# Patient Record
Sex: Female | Born: 1935 | Race: White | Hispanic: No | Marital: Married | Smoking: Never smoker
Health system: Southern US, Community
[De-identification: ages and names within clinical notes are randomized; demographics above are authoritative.]

## PROBLEM LIST (undated history)

## (undated) DIAGNOSIS — S93402A Sprain of unspecified ligament of left ankle, initial encounter: Principal | ICD-10-CM

## (undated) DIAGNOSIS — E079 Disorder of thyroid, unspecified: Secondary | ICD-10-CM

## (undated) DIAGNOSIS — T7840XA Allergy, unspecified, initial encounter: Secondary | ICD-10-CM

## (undated) DIAGNOSIS — L989 Disorder of the skin and subcutaneous tissue, unspecified: Secondary | ICD-10-CM

## (undated) DIAGNOSIS — F329 Major depressive disorder, single episode, unspecified: Secondary | ICD-10-CM

## (undated) DIAGNOSIS — N390 Urinary tract infection, site not specified: Secondary | ICD-10-CM

## (undated) DIAGNOSIS — L570 Actinic keratosis: Secondary | ICD-10-CM

## (undated) DIAGNOSIS — R51 Headache: Secondary | ICD-10-CM

## (undated) DIAGNOSIS — N6019 Diffuse cystic mastopathy of unspecified breast: Secondary | ICD-10-CM

## (undated) DIAGNOSIS — Z0000 Encounter for general adult medical examination without abnormal findings: Secondary | ICD-10-CM

## (undated) DIAGNOSIS — E785 Hyperlipidemia, unspecified: Secondary | ICD-10-CM

## (undated) DIAGNOSIS — M549 Dorsalgia, unspecified: Secondary | ICD-10-CM

## (undated) DIAGNOSIS — M1990 Unspecified osteoarthritis, unspecified site: Secondary | ICD-10-CM

## (undated) DIAGNOSIS — Z124 Encounter for screening for malignant neoplasm of cervix: Secondary | ICD-10-CM

## (undated) DIAGNOSIS — F32A Depression, unspecified: Secondary | ICD-10-CM

## (undated) HISTORY — DX: Urinary tract infection, site not specified: N39.0

## (undated) HISTORY — DX: Hyperlipidemia, unspecified: E78.5

## (undated) HISTORY — DX: Depression, unspecified: F32.A

## (undated) HISTORY — DX: Unspecified osteoarthritis, unspecified site: M19.90

## (undated) HISTORY — DX: Sprain of unspecified ligament of left ankle, initial encounter: S93.402A

## (undated) HISTORY — DX: Disorder of the skin and subcutaneous tissue, unspecified: L98.9

## (undated) HISTORY — DX: Allergy, unspecified, initial encounter: T78.40XA

## (undated) HISTORY — PX: BREAST SURGERY: SHX581

## (undated) HISTORY — DX: Disorder of thyroid, unspecified: E07.9

## (undated) HISTORY — DX: Encounter for general adult medical examination without abnormal findings: Z00.00

## (undated) HISTORY — DX: Actinic keratosis: L57.0

## (undated) HISTORY — DX: Encounter for screening for malignant neoplasm of cervix: Z12.4

## (undated) HISTORY — DX: Diffuse cystic mastopathy of unspecified breast: N60.19

## (undated) HISTORY — PX: APPENDECTOMY: SHX54

## (undated) HISTORY — DX: Major depressive disorder, single episode, unspecified: F32.9

## (undated) HISTORY — PX: TOTAL HIP ARTHROPLASTY: SHX124

## (undated) HISTORY — DX: Dorsalgia, unspecified: M54.9

---

## 1980-09-27 HISTORY — PX: ABDOMINAL HYSTERECTOMY: SHX81

## 2000-09-27 HISTORY — PX: OTHER SURGICAL HISTORY: SHX169

## 2006-09-27 LAB — HM PAP SMEAR

## 2011-08-04 ENCOUNTER — Ambulatory Visit (INDEPENDENT_AMBULATORY_CARE_PROVIDER_SITE_OTHER): Payer: Medicare Other | Admitting: Family Medicine

## 2011-08-04 ENCOUNTER — Encounter: Payer: Self-pay | Admitting: Family Medicine

## 2011-08-04 DIAGNOSIS — M8580 Other specified disorders of bone density and structure, unspecified site: Secondary | ICD-10-CM | POA: Insufficient documentation

## 2011-08-04 DIAGNOSIS — M129 Arthropathy, unspecified: Secondary | ICD-10-CM

## 2011-08-04 DIAGNOSIS — L570 Actinic keratosis: Secondary | ICD-10-CM

## 2011-08-04 DIAGNOSIS — M1990 Unspecified osteoarthritis, unspecified site: Secondary | ICD-10-CM | POA: Insufficient documentation

## 2011-08-04 DIAGNOSIS — E079 Disorder of thyroid, unspecified: Secondary | ICD-10-CM

## 2011-08-04 DIAGNOSIS — N6019 Diffuse cystic mastopathy of unspecified breast: Secondary | ICD-10-CM

## 2011-08-04 DIAGNOSIS — Z0000 Encounter for general adult medical examination without abnormal findings: Secondary | ICD-10-CM | POA: Insufficient documentation

## 2011-08-04 DIAGNOSIS — M810 Age-related osteoporosis without current pathological fracture: Secondary | ICD-10-CM

## 2011-08-04 DIAGNOSIS — F32A Depression, unspecified: Secondary | ICD-10-CM | POA: Insufficient documentation

## 2011-08-04 DIAGNOSIS — J302 Other seasonal allergic rhinitis: Secondary | ICD-10-CM | POA: Insufficient documentation

## 2011-08-04 DIAGNOSIS — E785 Hyperlipidemia, unspecified: Secondary | ICD-10-CM

## 2011-08-04 DIAGNOSIS — N390 Urinary tract infection, site not specified: Secondary | ICD-10-CM

## 2011-08-04 DIAGNOSIS — E039 Hypothyroidism, unspecified: Secondary | ICD-10-CM | POA: Insufficient documentation

## 2011-08-04 DIAGNOSIS — T7840XA Allergy, unspecified, initial encounter: Secondary | ICD-10-CM

## 2011-08-04 DIAGNOSIS — F329 Major depressive disorder, single episode, unspecified: Secondary | ICD-10-CM

## 2011-08-04 HISTORY — DX: Actinic keratosis: L57.0

## 2011-08-04 HISTORY — DX: Urinary tract infection, site not specified: N39.0

## 2011-08-04 HISTORY — DX: Encounter for general adult medical examination without abnormal findings: Z00.00

## 2011-08-04 MED ORDER — FLUTICASONE PROPIONATE 50 MCG/ACT NA SUSP: NASAL | Status: DC

## 2011-08-04 NOTE — Assessment & Plan Note (Signed)
Patient up to date on flu shot, Td, colonoscopy (06/28/11), MGM and pap date back to 2008 and she agrees to return soon for annual GYN visit.

## 2011-08-04 NOTE — Progress Notes (Signed)
Molly Fleming [id number] [birth year] 08/04/2011      Progress Note New Patient  Subjective  Chief Complaint  Chief Complaint  Patient presents with  . Establish Care    new patient    HPI  Patient is a 75 yo Caucasian female in today for new patient appt. she is generally in good health but does have multiple things she manages. Presently her allergies have flared. His nasal congestion itching is taking her Zyrtec daily but is actually most annoyed by her visual symptoms at this time. She describes blurring and itching and discharge in her eyes. Has tried eyedrops and asked with no good result. She has a diagnosis of osteoporosis dating back roughly 13 years. Was maintained on Fosamax for roughly 10 years but chose to stop about 2 years ago due to pain she had red. He did not have a bone densitometry in greater than 10 years. No recent falls or fractures. She does follow with dermatology practice secondary to actinic keratosis. She overall feels he is doing well but stays on a low dose of Prozac to treat history depression anxiety secondary to some anxiety disorder and a 26 year old mother with dementia lives alone in [place] he still. She helps to manage her mother does have home health care but finds this stressful period she does struggle with a tractogram. Past without good results. She also complains of chronic IBS symptoms. She reports frequent and loose stool especially when she overeats or eats fatty or rich foods. She can have anywhere from 2 visits from stool daily. She takes a half a milligram twice a day she has less stooling. Denies bloody or tarry stool. A recent abdominal pain or urinary symptoms. No chest pain, palpitations, shortness of breath, GI or GU complaints noted at the present time.Has a h/o recurrent UTIs reporting an infection roughly every 3 months. Uses ciprofloxacin intermittently to treat these.  Past Medical History  Diagnosis Date  . Osteoporosis     started  taking meds at age 73 then just went off recently- due to conflict she has heard  . Hyperlipidemia   . Allergy     seasonal  . Mumps as a child  . Measles   . Whooping cough as a child  . Cystic disease of breast   . Arthritis     right hand supplement  . Depression   . Thyroid disease     s/p thyroidectomy, goiter  . Actinic keratosis 08/04/2011  . Preventative health care 08/04/2011    Past Surgical History  Procedure Date  . Removed part of thyroid 2002  . Breast surgery     breast biopsies, b/l benign  . Abdominal hysterectomy 1982    partial still has ovaries, uterine prolapse and menorraghia    Family History  Problem Relation Age of Onset  . Adopted: Yes  . Cancer Mother   . Heart attack Father   . Heart disease Father   . Cancer Brother     colon  . Anxiety disorder Son     History   Social History  . Marital Status: Married    Spouse Name: N/A    Number of Children: N/A  . Years of Education: N/A   Occupational History  . Not on file.   Social History Main Topics  . Smoking status: Never Smoker   . Smokeless tobacco: Never Used  . Alcohol Use: No  . Drug Use: No  . Sexually Active: Yes -- Female partner(s)  Other Topics Concern  . Not on file   Social History Narrative  . No narrative on file    No current outpatient prescriptions on file prior to visit.    No Known Allergies  Review of Systems  Review of Systems  Constitutional: Negative for fever, chills and malaise/fatigue.  HENT: Positive for congestion. Negative for hearing loss, nosebleeds and sore throat.   Eyes: Positive for blurred vision and discharge. Negative for double vision, photophobia, pain and redness.  Respiratory: Negative for cough, sputum production, shortness of breath and wheezing.   Cardiovascular: Negative for chest pain, palpitations and leg swelling.  Gastrointestinal: Negative for heartburn, nausea, vomiting, abdominal pain, diarrhea, constipation and blood  in stool.  Genitourinary: Negative for dysuria, urgency, frequency and hematuria.  Musculoskeletal: Negative for myalgias, back pain and falls.  Skin: Negative for itching and rash.  Neurological: Negative for dizziness, tremors, sensory change, focal weakness, loss of consciousness, weakness and headaches.  Endo/Heme/Allergies: Negative for polydipsia. Does not bruise/bleed easily.  Psychiatric/Behavioral: Negative for depression and suicidal ideas. The patient is not nervous/anxious and does not have insomnia.     Objective  BP 124/82  Pulse 69  Temp(Src) 97.7 F (36.5 C) (Oral)  Ht 5\' 2"  (1.575 m)  Wt 157 lb 12.8 oz (71.578 kg)  BMI 28.86 kg/m2  SpO2 98%  Physical Exam  Physical Exam  Constitutional: She is oriented to person, place, and time and well-developed, well-nourished, and in no distress. No distress.  HENT:  Head: Normocephalic and atraumatic.  Right Ear: External ear normal.  Left Ear: External ear normal.  Nose: Nose normal.  Mouth/Throat: Oropharynx is clear and moist. No oropharyngeal exudate.  Eyes: Conjunctivae are normal. Pupils are equal, round, and reactive to light. Right eye exhibits no discharge. Left eye exhibits no discharge. No scleral icterus.  Neck: Normal range of motion. Neck supple. No thyromegaly present.  Cardiovascular: Normal rate, regular rhythm, normal heart sounds and intact distal pulses.   No murmur heard. Pulmonary/Chest: Effort normal and breath sounds normal. No respiratory distress. She has no wheezes. She has no rales.  Abdominal: Soft. Bowel sounds are normal. She exhibits no distension and no mass. There is no tenderness.  Musculoskeletal: Normal range of motion. She exhibits no edema and no tenderness.  Lymphadenopathy:    She has no cervical adenopathy.  Neurological: She is alert and oriented to person, place, and time. She has normal reflexes. No cranial nerve deficit. Coordination normal.  Skin: Skin is warm and dry. No  rash noted. She is not diaphoretic.  Psychiatric: Mood, memory and affect normal.       Assessment & Plan  Cystic disease of breast Fibrocystic breast, last MGM in 2010. Will order with breast exam at next visit no concerns per patient today  Arthritis Worst in right hand and knees. Encouraged her to remain active and start fatty acid supplement  Allergic state Symptoms are worst in eyes at present will add a nasal steroid, send in an rx for Fluticasone or can try a sample of Nasonex, 1 spray each nostril daily and continue her Cetirizine  Osteoporosis Patient stopped her Fosamax roughly 2 1/2 years ago after being on it for roughly 10 years. We will proceed with repeat bone densitometry which she has not had in roughly 10 years. Encouraged her to stay active and get good calcium and vitamin d intake  Thyroid disease Will check TSH level prior to next visit.  Hyperlipidemia Avoid trans fats, increase exercise and  add a Megared cap daily  Depression She has been stable on a low dose of Prozac and she would like to stay on it for now  Actinic keratosis No recent concerning lesions  Preventative health care Patient up to date on flu shot, Td, colonoscopy (06/28/11), MGM and pap date back to 2008 and she agrees to return soon for annual GYN visit.  Frequent UTI Patient may use cipro prophylactically s/p coitus or as needed for symptoms.

## 2011-08-04 NOTE — Assessment & Plan Note (Signed)
Worst in right hand and knees. Encouraged her to remain active and start fatty acid supplement

## 2011-08-04 NOTE — Patient Instructions (Signed)

## 2011-08-04 NOTE — Assessment & Plan Note (Signed)
Patient may use cipro prophylactically s/p coitus or as needed for symptoms.

## 2011-08-04 NOTE — Assessment & Plan Note (Signed)
No recent concerning lesions

## 2011-08-04 NOTE — Assessment & Plan Note (Signed)
Fibrocystic breast, last MGM in 2010. Will order with breast exam at next visit no concerns per patient today

## 2011-08-04 NOTE — Assessment & Plan Note (Addendum)
Symptoms are worst in eyes at present will add a nasal steroid, send in an rx for Fluticasone or can try a sample of Nasonex, 1 spray each nostril daily and continue her Cetirizine

## 2011-08-04 NOTE — Assessment & Plan Note (Signed)
Will check TSH level prior to next visit.

## 2011-08-04 NOTE — Assessment & Plan Note (Signed)
She has been stable on a low dose of Prozac and she would like to stay on it for now

## 2011-08-04 NOTE — Assessment & Plan Note (Signed)
Patient stopped her Fosamax roughly 2 1/2 years ago after being on it for roughly 10 years. We will proceed with repeat bone densitometry which she has not had in roughly 10 years. Encouraged her to stay active and get good calcium and vitamin d intake

## 2011-08-04 NOTE — Assessment & Plan Note (Signed)
Avoid trans fats, increase exercise and add a Megared cap daily

## 2011-08-12 ENCOUNTER — Ambulatory Visit
Admission: RE | Admit: 2011-08-12 | Discharge: 2011-08-12 | Disposition: A | Discharge status: Home / Self Care | Payer: Medicare Other | Source: Ambulatory Visit | Attending: Family Medicine | Admitting: Family Medicine

## 2011-08-12 DIAGNOSIS — Z780 Asymptomatic menopausal state: Secondary | ICD-10-CM

## 2011-08-30 ENCOUNTER — Telehealth: Payer: Self-pay

## 2011-08-30 NOTE — Telephone Encounter (Signed)
Per MD to inform pt that dexa scan improved to Osteopenia. MD recommends patient to continue calcium, vitamin d bid and exercise.  Left a detailed message on patients voicemail

## 2011-09-01 ENCOUNTER — Encounter: Payer: Self-pay | Admitting: Family Medicine

## 2011-09-14 ENCOUNTER — Inpatient Hospital Stay (HOSPITAL_COMMUNITY): Payer: Medicare Other

## 2011-09-14 ENCOUNTER — Emergency Department (HOSPITAL_COMMUNITY): Payer: Medicare Other

## 2011-09-14 ENCOUNTER — Encounter (HOSPITAL_COMMUNITY): Payer: Self-pay | Admitting: Internal Medicine

## 2011-09-14 ENCOUNTER — Inpatient Hospital Stay (HOSPITAL_COMMUNITY)
Admission: EM | Admit: 2011-09-14 | Discharge: 2011-09-20 | DRG: 065 | Disposition: A | Discharge status: Home Health | Payer: Medicare Other | Attending: Internal Medicine | Admitting: Internal Medicine

## 2011-09-14 ENCOUNTER — Other Ambulatory Visit: Payer: Self-pay

## 2011-09-14 DIAGNOSIS — F3289 Other specified depressive episodes: Secondary | ICD-10-CM | POA: Diagnosis present

## 2011-09-14 DIAGNOSIS — T7840XA Allergy, unspecified, initial encounter: Secondary | ICD-10-CM

## 2011-09-14 DIAGNOSIS — F329 Major depressive disorder, single episode, unspecified: Secondary | ICD-10-CM | POA: Diagnosis present

## 2011-09-14 DIAGNOSIS — Z79899 Other long term (current) drug therapy: Secondary | ICD-10-CM

## 2011-09-14 DIAGNOSIS — I639 Cerebral infarction, unspecified: Secondary | ICD-10-CM | POA: Diagnosis present

## 2011-09-14 DIAGNOSIS — I6340 Cerebral infarction due to embolism of unspecified cerebral artery: Principal | ICD-10-CM | POA: Diagnosis present

## 2011-09-14 DIAGNOSIS — E876 Hypokalemia: Secondary | ICD-10-CM | POA: Diagnosis present

## 2011-09-14 DIAGNOSIS — M810 Age-related osteoporosis without current pathological fracture: Secondary | ICD-10-CM | POA: Diagnosis present

## 2011-09-14 DIAGNOSIS — R197 Diarrhea, unspecified: Secondary | ICD-10-CM | POA: Diagnosis present

## 2011-09-14 DIAGNOSIS — E785 Hyperlipidemia, unspecified: Secondary | ICD-10-CM | POA: Diagnosis present

## 2011-09-14 DIAGNOSIS — E039 Hypothyroidism, unspecified: Secondary | ICD-10-CM | POA: Diagnosis present

## 2011-09-14 DIAGNOSIS — E872 Acidosis, unspecified: Secondary | ICD-10-CM | POA: Diagnosis present

## 2011-09-14 DIAGNOSIS — G43909 Migraine, unspecified, not intractable, without status migrainosus: Secondary | ICD-10-CM | POA: Diagnosis present

## 2011-09-14 DIAGNOSIS — Z87440 Personal history of urinary (tract) infections: Secondary | ICD-10-CM

## 2011-09-14 DIAGNOSIS — R4182 Altered mental status, unspecified: Secondary | ICD-10-CM

## 2011-09-14 DIAGNOSIS — R42 Dizziness and giddiness: Secondary | ICD-10-CM | POA: Diagnosis present

## 2011-09-14 HISTORY — DX: Headache: R51

## 2011-09-14 LAB — CBC
HCT: 37.6 % (ref 36.0–46.0)
Hemoglobin: 13.1 g/dL (ref 12.0–15.0)
Hemoglobin: 12.7 g/dL (ref 12.0–15.0)
MCH: 30.3 pg (ref 26.0–34.0)
MCH: 29.5 pg (ref 26.0–34.0)
MCHC: 34.8 g/dL (ref 30.0–36.0)
MCV: 87 fL (ref 78.0–100.0)
MCV: 88.4 fL (ref 78.0–100.0)
Platelets: 199 10*3/uL (ref 150–400)
Platelets: 217 10*3/uL (ref 150–400)
RBC: 4.32 MIL/uL (ref 3.87–5.11)
RBC: 4.31 MIL/uL (ref 3.87–5.11)
RDW: 12.4 % (ref 11.5–15.5)
WBC: 10 10*3/uL (ref 4.0–10.5)
WBC: 11.1 10*3/uL — ABNORMAL HIGH (ref 4.0–10.5)

## 2011-09-14 LAB — BASIC METABOLIC PANEL
BUN: 22 mg/dL (ref 6–23)
CO2: 21 mEq/L (ref 19–32)
Calcium: 9.3 mg/dL (ref 8.4–10.5)
Chloride: 103 mEq/L (ref 96–112)
Creatinine, Ser: 0.81 mg/dL (ref 0.50–1.10)
GFR calc Af Amer: 82 mL/min — ABNORMAL LOW (ref >90)
GFR calc non Af Amer: 71 mL/min — ABNORMAL LOW (ref >90)
Glucose, Bld: 152 mg/dL — ABNORMAL HIGH (ref 70–99)
Potassium: 3.1 mEq/L — ABNORMAL LOW (ref 3.5–5.1)
Sodium: 139 mEq/L (ref 135–145)

## 2011-09-14 LAB — COMPREHENSIVE METABOLIC PANEL
AST: 25 U/L (ref 0–37)
Albumin: 3.7 g/dL (ref 3.5–5.2)
BUN: 15 mg/dL (ref 6–23)
Calcium: 8.1 mg/dL — ABNORMAL LOW (ref 8.4–10.5)
Chloride: 106 mEq/L (ref 96–112)
Creatinine, Ser: 0.73 mg/dL (ref 0.50–1.10)
Total Bilirubin: 0.3 mg/dL (ref 0.3–1.2)
Total Protein: 7.2 g/dL (ref 6.0–8.3)

## 2011-09-14 LAB — POCT I-STAT, CHEM 8
BUN: 22 mg/dL (ref 6–23)
Calcium, Ion: 1.07 mmol/L — ABNORMAL LOW (ref 1.12–1.32)
Chloride: 108 mEq/L (ref 96–112)
Creatinine, Ser: 0.9 mg/dL (ref 0.50–1.10)
Glucose, Bld: 157 mg/dL — ABNORMAL HIGH (ref 70–99)
HCT: 40 % (ref 36.0–46.0)
Hemoglobin: 13.6 g/dL (ref 12.0–15.0)
Potassium: 3.1 mEq/L — ABNORMAL LOW (ref 3.5–5.1)
Sodium: 142 mEq/L (ref 135–145)
TCO2: 22 mmol/L (ref 0–100)

## 2011-09-14 LAB — URINALYSIS, ROUTINE W REFLEX MICROSCOPIC
Bilirubin Urine: NEGATIVE
Glucose, UA: NEGATIVE mg/dL
Hgb urine dipstick: NEGATIVE
Ketones, ur: 40 mg/dL — AB
Leukocytes, UA: NEGATIVE
Nitrite: NEGATIVE
Protein, ur: NEGATIVE mg/dL
Specific Gravity, Urine: 1.02 (ref 1.005–1.030)
Urobilinogen, UA: 0.2 mg/dL (ref 0.0–1.0)
pH: 7.5 (ref 5.0–8.0)

## 2011-09-14 LAB — CARDIAC PANEL(CRET KIN+CKTOT+MB+TROPI)
CK, MB: 2.8 ng/mL (ref 0.3–4.0)
CK, MB: 2.3 ng/mL (ref 0.3–4.0)
Relative Index: 2.5 (ref 0.0–2.5)
Troponin I: <0.3 ng/mL (ref <0.30)
Troponin I: <0.3 ng/mL (ref <0.30)

## 2011-09-14 LAB — TROPONIN I: Troponin I: <0.3 ng/mL (ref <0.30)

## 2011-09-14 MED ORDER — DIAZEPAM 5 MG/ML IJ SOLN
5.0000 mg | Freq: Once | INTRAMUSCULAR | Status: AC
  Administered 2011-09-14: 5 mg via INTRAVENOUS

## 2011-09-14 MED ORDER — ONDANSETRON HCL 4 MG/2ML IJ SOLN
4.0000 mg | Freq: Three times a day (TID) | INTRAMUSCULAR | Status: AC | PRN
  Filled 2011-09-14: qty 2

## 2011-09-14 MED ORDER — SIMVASTATIN 20 MG PO TABS
20.0000 mg | ORAL_TABLET | Freq: Every day | ORAL | Status: DC
  Administered 2011-09-14 – 2011-09-19 (×6): 20 mg via ORAL
  Filled 2011-09-14 (×7): qty 1

## 2011-09-14 MED ORDER — DIAZEPAM 5 MG/ML IJ SOLN
INTRAMUSCULAR | Status: AC
  Administered 2011-09-14: 5 mg via INTRAVENOUS
  Filled 2011-09-14: qty 2

## 2011-09-14 MED ORDER — POTASSIUM CHLORIDE 10 MEQ/100ML IV SOLN
10.0000 meq | INTRAVENOUS | Status: AC
  Filled 2011-09-14 (×3): qty 100

## 2011-09-14 MED ORDER — ONDANSETRON HCL 4 MG/2ML IJ SOLN
4.0000 mg | Freq: Once | INTRAMUSCULAR | Status: AC
  Administered 2011-09-14: 4 mg via INTRAVENOUS
  Filled 2011-09-14: qty 2

## 2011-09-14 MED ORDER — SENNOSIDES-DOCUSATE SODIUM 8.6-50 MG PO TABS
1.0000 | ORAL_TABLET | Freq: Every evening | ORAL | Status: DC | PRN
  Filled 2011-09-14: qty 1

## 2011-09-14 MED ORDER — POTASSIUM CHLORIDE 10 MEQ/100ML IV SOLN
10.0000 meq | Freq: Once | INTRAVENOUS | Status: AC
  Administered 2011-09-14: 10 meq via INTRAVENOUS
  Filled 2011-09-14: qty 100

## 2011-09-14 MED ORDER — LORATADINE 10 MG PO TABS
10.0000 mg | ORAL_TABLET | Freq: Every day | ORAL | Status: DC
  Administered 2011-09-15 – 2011-09-20 (×6): 10 mg via ORAL
  Filled 2011-09-14 (×7): qty 1

## 2011-09-14 MED ORDER — SODIUM CHLORIDE 0.9 % IV SOLN
INTRAVENOUS | Status: DC
  Administered 2011-09-14 – 2011-09-15 (×2): via INTRAVENOUS

## 2011-09-14 MED ORDER — SODIUM CHLORIDE 0.9 % IV SOLN
INTRAVENOUS | Status: AC
  Administered 2011-09-14: 09:00:00 via INTRAVENOUS

## 2011-09-14 MED ORDER — SODIUM CHLORIDE 0.9 % IV BOLUS (SEPSIS)
1000.0000 mL | Freq: Once | INTRAVENOUS | Status: AC
  Administered 2011-09-14: 1000 mL via INTRAVENOUS

## 2011-09-14 MED ORDER — ONDANSETRON HCL 4 MG/2ML IJ SOLN
INTRAMUSCULAR | Status: AC
  Administered 2011-09-14: 4 mg via INTRAVENOUS
  Filled 2011-09-14: qty 2

## 2011-09-14 MED ORDER — ONDANSETRON HCL 4 MG/2ML IJ SOLN
4.0000 mg | Freq: Once | INTRAMUSCULAR | Status: AC
  Administered 2011-09-14: 4 mg via INTRAVENOUS

## 2011-09-14 MED ORDER — LEVOTHYROXINE SODIUM 75 MCG PO TABS
75.0000 ug | ORAL_TABLET | Freq: Every day | ORAL | Status: DC
  Administered 2011-09-15 – 2011-09-20 (×6): 75 ug via ORAL
  Filled 2011-09-14 (×8): qty 1

## 2011-09-14 MED ORDER — PROMETHAZINE HCL 25 MG/ML IJ SOLN
12.5000 mg | INTRAMUSCULAR | Status: AC
Start: 2011-09-14 — End: 2011-09-14
  Administered 2011-09-14: 12.5 mg via INTRAVENOUS
  Filled 2011-09-14: qty 1

## 2011-09-14 MED ORDER — VITAMIN B-12 1000 MCG PO TABS
1000.0000 ug | ORAL_TABLET | Freq: Every day | ORAL | Status: DC
  Administered 2011-09-15 – 2011-09-20 (×6): 1000 ug via ORAL
  Filled 2011-09-14 (×7): qty 1

## 2011-09-14 MED ORDER — PAROXETINE HCL 10 MG PO TABS
10.0000 mg | ORAL_TABLET | ORAL | Status: DC
  Administered 2011-09-15 – 2011-09-20 (×6): 10 mg via ORAL
  Filled 2011-09-14 (×8): qty 1

## 2011-09-14 MED ORDER — FLUTICASONE PROPIONATE 50 MCG/ACT NA SUSP
1.0000 | Freq: Every day | NASAL | Status: DC
  Administered 2011-09-14 – 2011-09-20 (×7): 1 via NASAL
  Filled 2011-09-14: qty 16

## 2011-09-14 NOTE — ED Notes (Signed)
Back from CT.  Pt states she is nauseated and "feels sick".  Pt does not recognize her son who is at the bedside, but she does recognize her husband.

## 2011-09-14 NOTE — ED Notes (Signed)
No relief after zofran.  Continues to moan and say, "I'm so sick".

## 2011-09-14 NOTE — H&P (Addendum)
TARAH BUBOLTZ is an 75 y.o. female.   PCP- Corinda Gubler at [place]. Chief Complaint: Dizziness. HPI: 75 year-old female history of hyperlipidemia, hypothyroidism, chronic recurrent diarrhea on Imodium, chronic recurrent UTI was watching television with her husband at around 10 PM she complained of fullness in the right ear and started complaining dizziness. Her husband has to help her to the bedroom and she was feeling intensely dizzy. Then she started throwing up multiple times. She became very confused. At around 12 AM she was brought ER. CT of the head without contrast nausea and the acute. The ER physician Dr. Juleen China tried some diazepam for possible vertigo. This did not help. Patient in addition also was no abdominal discomfort. In and out catheter had 300 cc urine drained. She still complaining of abdominal discomfort around the epigastric area and also the dizziness. She has become more oriented and is able to recognize her husband but still not able to say the date and time and place. Patient will be admitted for further workup. Patient did not complain of any chest pain, cough, did not function of upper or lower extremities, denied any headache. Did not lose consciousness.  Past Medical History  Diagnosis Date  . Osteoporosis     started taking meds at age 17 then just went off recently- due to conflict she has heard  . Hyperlipidemia   . Allergy     seasonal  . Mumps as a child  . Measles   . Whooping cough as a child  . Cystic disease of breast   . Arthritis     right hand supplement  . Depression   . Thyroid disease     s/p thyroidectomy, goiter  . Actinic keratosis 08/04/2011  . Preventative health care 08/04/2011  . Frequent UTI 08/04/2011    Past Surgical History  Procedure Date  . Removed part of thyroid 2002  . Breast surgery     breast biopsies, b/l benign  . Abdominal hysterectomy 1982    partial still has ovaries, uterine prolapse and menorraghia    Family History    Problem Relation Age of Onset  . Adopted: Yes  . Cancer Mother   . Heart attack Father   . Heart disease Father   . Cancer Brother     colon  . Anxiety disorder Son    Social History:  reports that she has never smoked. She has never used smokeless tobacco. She reports that she does not drink alcohol or use illicit drugs.  Allergies:  Allergies  Allergen Reactions  . Sulfa Antibiotics Other (See Comments)    unknown    Medications Prior to Admission  Medication Dose Route Frequency Provider Last Rate Last Dose  . diazepam (VALIUM) injection 5 mg  5 mg Intravenous Once Raeford Razor, MD   5 mg at 09/14/11 0144  . ondansetron (ZOFRAN) injection 4 mg  4 mg Intravenous Once Raeford Razor, MD   4 mg at 09/14/11 0141  . ondansetron (ZOFRAN) injection 4 mg  4 mg Intravenous Once Raeford Razor, MD   4 mg at 09/14/11 0252  . ondansetron (ZOFRAN) injection 4 mg  4 mg Intravenous Once Raeford Razor, MD   4 mg at 09/14/11 1610  . potassium chloride 10 mEq in 100 mL IVPB  10 mEq Intravenous Once Raeford Razor, MD   10 mEq at 09/14/11 0256  . promethazine (PHENERGAN) injection 12.5 mg  12.5 mg Intravenous To Major Raeford Razor, MD      .  sodium chloride 0.9 % bolus 1,000 mL  1,000 mL Intravenous Once Raeford Razor, MD   1,000 mL at 09/14/11 0140   Medications Prior to Admission  Medication Sig Dispense Refill  . atorvastatin (LIPITOR) 10 MG tablet Take 10 mg by mouth daily.        . Calcium Carbonate-Vitamin D (CALCIUM 600 + D PO) Take 1 tablet by mouth 2 (two) times daily.       . cetirizine (ZYRTEC) 10 MG tablet Take 10 mg by mouth daily.        . ciprofloxacin (CIPRO) 500 MG tablet Take 500 mg by mouth 2 (two) times daily as needed. For urinary tract infection      . fluticasone (FLONASE) 50 MCG/ACT nasal spray 1 spray each nostril daily  1 g  2  . levothyroxine (SYNTHROID, LEVOTHROID) 75 MCG tablet Take 75 mcg by mouth daily.        . Loperamide HCl (IMODIUM PO) Take by mouth as  directed.        Marland Kitchen PARoxetine (PAXIL) 10 MG tablet Take 10 mg by mouth every morning.        . vitamin B-12 (CYANOCOBALAMIN) 1000 MCG tablet Take 1,000 mcg by mouth daily.         Results for orders placed during the hospital encounter of 09/14/11 (from the past 48 hour(s))  CBC     Status: Normal   Collection Time   09/14/11  1:38 AM      Component Value Range Comment   WBC 10.0  4.0 - 10.5 (K/uL)    RBC 4.32  3.87 - 5.11 (MIL/uL)    Hemoglobin 13.1  12.0 - 15.0 (g/dL)    HCT 16.1  09.6 - 04.5 (%)    MCV 87.0  78.0 - 100.0 (fL)    MCH 30.3  26.0 - 34.0 (pg)    MCHC 34.8  30.0 - 36.0 (g/dL)    RDW 40.9  81.1 - 91.4 (%)    Platelets 199  150 - 400 (K/uL)   BASIC METABOLIC PANEL     Status: Abnormal   Collection Time   09/14/11  1:38 AM      Component Value Range Comment   Sodium 139  135 - 145 (mEq/L)    Potassium 3.1 (*) 3.5 - 5.1 (mEq/L)    Chloride 103  96 - 112 (mEq/L)    CO2 21  19 - 32 (mEq/L)    Glucose, Bld 152 (*) 70 - 99 (mg/dL)    BUN 22  6 - 23 (mg/dL)    Creatinine, Ser 7.82  0.50 - 1.10 (mg/dL)    Calcium 9.3  8.4 - 10.5 (mg/dL)    GFR calc non Af Amer 71 (*) >90 (mL/min)    GFR calc Af Amer 82 (*) >90 (mL/min)   TROPONIN I     Status: Normal   Collection Time   09/14/11  1:39 AM      Component Value Range Comment   Troponin I <0.30  <0.30 (ng/mL)   POCT I-STAT, CHEM 8     Status: Abnormal   Collection Time   09/14/11  2:02 AM      Component Value Range Comment   Sodium 142  135 - 145 (mEq/L)    Potassium 3.1 (*) 3.5 - 5.1 (mEq/L)    Chloride 108  96 - 112 (mEq/L)    BUN 22  6 - 23 (mg/dL)    Creatinine, Ser 9.56  0.50 -  1.10 (mg/dL)    Glucose, Bld 213 (*) 70 - 99 (mg/dL)    Calcium, Ion 0.86 (*) 1.12 - 1.32 (mmol/L)    TCO2 22  0 - 100 (mmol/L)    Hemoglobin 13.6  12.0 - 15.0 (g/dL)    HCT 57.8  46.9 - 62.9 (%)   URINALYSIS, ROUTINE W REFLEX MICROSCOPIC     Status: Abnormal   Collection Time   09/14/11  3:57 AM      Component Value Range Comment    Color, Urine YELLOW  YELLOW     APPearance CLOUDY (*) CLEAR     Specific Gravity, Urine 1.020  1.005 - 1.030     pH 7.5  5.0 - 8.0     Glucose, UA NEGATIVE  NEGATIVE (mg/dL)    Hgb urine dipstick NEGATIVE  NEGATIVE     Bilirubin Urine NEGATIVE  NEGATIVE     Ketones, ur 40 (*) NEGATIVE (mg/dL)    Protein, ur NEGATIVE  NEGATIVE (mg/dL)    Urobilinogen, UA 0.2  0.0 - 1.0 (mg/dL)    Nitrite NEGATIVE  NEGATIVE     Leukocytes, UA NEGATIVE  NEGATIVE  MICROSCOPIC NOT DONE ON URINES WITH NEGATIVE PROTEIN, BLOOD, LEUKOCYTES, NITRITE, OR GLUCOSE <1000 mg/dL.   Ct Head Wo Contrast  09/14/2011  *RADIOLOGY REPORT*  Clinical Data: Dizziness, nausea, altered mental status.  Right sided hearing loss.  CT HEAD WITHOUT CONTRAST  Technique:  Contiguous axial images were obtained from the base of the skull through the vertex without contrast.  Comparison: None.  Findings: There is no evidence for acute hemorrhage, hydrocephalus, mass lesion, or abnormal extra-axial fluid collection.  No definite CT evidence for acute infarction.  Mild cerebellar volume loss. The visualized paranasal sinuses and mastoid air cells are predominately clear.  IMPRESSION: Mild cerebellar atrophy.  No definite acute intracranial abnormality.  Original Report Authenticated By: Waneta Martins, M.D.    Review of Systems  Constitutional: Negative.   HENT: Negative.   Eyes: Negative.   Respiratory: Negative.   Cardiovascular: Negative.   Gastrointestinal: Positive for nausea and abdominal pain.  Genitourinary: Negative.   Musculoskeletal: Negative.   Skin: Negative.   Neurological: Positive for dizziness.       Confusion  Endo/Heme/Allergies: Negative.   Psychiatric/Behavioral: Negative.     Blood pressure 141/83, pulse 65, temperature 96.9 F (36.1 C), temperature source Rectal, resp. rate 24, SpO2 100.00%. Physical Exam  Constitutional: She appears well-developed and well-nourished. No distress.  HENT:  Head:  Normocephalic and atraumatic.  Right Ear: External ear normal.  Left Ear: External ear normal.  Nose: Nose normal.  Mouth/Throat: Oropharynx is clear and moist. No oropharyngeal exudate.  Eyes: Conjunctivae and EOM are normal. Pupils are equal, round, and reactive to light. Right eye exhibits no discharge. Left eye exhibits no discharge. No scleral icterus.  Neck: Normal range of motion. Neck supple. No JVD present.  Cardiovascular: Normal rate, regular rhythm and normal heart sounds.   Respiratory: Effort normal and breath sounds normal. No stridor. No respiratory distress. She has no wheezes. She has no rales.  GI: Soft. Bowel sounds are normal. She exhibits no distension. There is no tenderness. There is no rebound.  Musculoskeletal: She exhibits no edema and no tenderness.  Neurological: She is alert.       Oriented to her name only. Able to recognize her husband. Moves upper and lower limbs. No facial asymmetry. Tongue is midline.  Skin: Skin is warm and dry. No rash noted. She  is not diaphoretic. No erythema.     Assessment/Plan #1. Dizziness with nausea and vomiting at this time concerning for CVA - will place patient on neurochecks, swallow evaluation, get MRI/MRA brain, 2-D echo and carotid Dopplers. I have consulted on-call neurologist Dr. Anne Hahn. #2. Mild metabolic acidosis - could be from her chronic diarrhea and also from the recurrent nausea and vomiting last night. We'll hydrate and recheck metabolic panel. #3. Abdominal discomfort - patient complain of persistent discomfort at the epigastric area. Her abdominal exam is benign. We will check a lipase level and repeat her LFT and date CT abdomen pelvis. #4. History of chronic diarrhea - presently gently hydrate and also will check the CT abdomen pelvis as she is complaining of abdominal discomfort. #5. History of hypothyroidism and hyperlipidemia - we'll check TSH and lipid panel. #6. History of recurrent UTI - UA looks  normal.  CODE STATUS - full code  Kendrea Cerritos N. 09/14/2011, 5:18 AM

## 2011-09-14 NOTE — Consult Note (Signed)
Hospital Consult Note Date: 09/14/2011  Patient name: Molly Fleming Medical record number: [id number] Date of birth: 18-Jan-1936 Age: 75 y.o. Gender: female PCP: Danise Edge, MD, MD  Medical Service: Neurology Attending name: Dr. Pearlean Brownie  Reason for consult: dizziness possible acute stroke  History of Present Illness: The patient is a 75 year-old woman who presented to the ED with new onset dizziness causing nausea and intactible vomiting. This dizziness started the night of admission. That morning she was feeling not her usual self. She has not otherwise been sick recently. She was complaining of abdominal discomfort upon presentation and was catheterized in the ED and had partial relief of the pain. She currently is having discomfort in her abdomen but not pain. Still having vertigo and is unable to sit up. Denied SOB, chest pain, leg pain. She lost hearing in her right ear about half-an-hour before she became dizzy yesterday. She also states that she cannot remember the events of yesterday evening quite well until this morning. The family also feel that she seemed forgetful and was this disoriented and confused last night. She is feeling better today except for dizziness when she tries to move  Meds: Medications Prior to Admission  Medication Dose Route Frequency Provider Last Rate Last Dose  . 0.9 %  sodium chloride infusion   Intravenous STAT Raeford Razor, MD      . 0.9 %  sodium chloride infusion   Intravenous Continuous Eduard Clos      . diazepam (VALIUM) injection 5 mg  5 mg Intravenous Once Raeford Razor, MD   5 mg at 09/14/11 0144  . fluticasone (FLONASE) 50 MCG/ACT nasal spray 1 spray  1 spray Each Nare Daily Eduard Clos      . levothyroxine (SYNTHROID, LEVOTHROID) tablet 75 mcg  75 mcg Oral QAC breakfast Eduard Clos      . loratadine (CLARITIN) tablet 10 mg  10 mg Oral Daily Eduard Clos      . ondansetron (ZOFRAN) injection 4 mg  4 mg  Intravenous Once Raeford Razor, MD   4 mg at 09/14/11 0141  . ondansetron (ZOFRAN) injection 4 mg  4 mg Intravenous Once Raeford Razor, MD   4 mg at 09/14/11 0252  . ondansetron (ZOFRAN) injection 4 mg  4 mg Intravenous Q8H PRN Raeford Razor, MD      . ondansetron [facility]) injection 4 mg  4 mg Intravenous Once Raeford Razor, MD   4 mg at 09/14/11 4098  . PARoxetine (PAXIL) tablet 10 mg  10 mg Oral Q0700 Eduard Clos      . potassium chloride 10 mEq in 100 mL IVPB  10 mEq Intravenous Once Raeford Razor, MD   10 mEq at 09/14/11 0256  . potassium chloride 10 mEq in 100 mL IVPB  10 mEq Intravenous Q1 Hr x 3 Ripudeep K Rai, MD      . promethazine (PHENERGAN) injection 12.5 mg  12.5 mg Intravenous To Major Raeford Razor, MD   12.5 mg at 09/14/11 0515  . senna-docusate (Senokot-S) tablet 1 tablet  1 tablet Oral QHS PRN Eduard Clos      . simvastatin (ZOCOR) tablet 20 mg  20 mg Oral q1800 Eduard Clos      . sodium chloride 0.9 % bolus 1,000 mL  1,000 mL Intravenous Once Raeford Razor, MD   1,000 mL at 09/14/11 0140  . vitamin B-12 (CYANOCOBALAMIN) tablet 1,000 mcg  1,000 mcg Oral Daily Eduard Clos  Medications Prior to Admission  Medication Sig Dispense Refill  . atorvastatin (LIPITOR) 10 MG tablet Take 10 mg by mouth daily.        . Calcium Carbonate-Vitamin D (CALCIUM 600 + D PO) Take 1 tablet by mouth 2 (two) times daily.       . cetirizine (ZYRTEC) 10 MG tablet Take 10 mg by mouth daily.        . ciprofloxacin (CIPRO) 500 MG tablet Take 500 mg by mouth 2 (two) times daily as needed. For urinary tract infection      . fluticasone (FLONASE) 50 MCG/ACT nasal spray 1 spray each nostril daily  1 g  2  . levothyroxine (SYNTHROID, LEVOTHROID) 75 MCG tablet Take 75 mcg by mouth daily.        . Loperamide HCl (IMODIUM PO) Take by mouth as directed.        Marland Kitchen PARoxetine (PAXIL) 10 MG tablet Take 10 mg by mouth every morning.        . vitamin B-12 (CYANOCOBALAMIN) 1000  MCG tablet Take 1,000 mcg by mouth daily.         Allergies: Sulfa antibiotics Past Medical History  Diagnosis Date  . Osteoporosis     started taking meds at age 69 then just went off recently- due to conflict she has heard  . Hyperlipidemia   . Allergy     seasonal  . Mumps as a child  . Measles   . Whooping cough as a child  . Cystic disease of breast   . Arthritis     right hand supplement  . Depression   . Thyroid disease     s/p thyroidectomy, goiter  . Actinic keratosis 08/04/2011  . Preventative health care 08/04/2011  . Frequent UTI 08/04/2011   Past Surgical History  Procedure Date  . Removed part of thyroid 2002  . Breast surgery     breast biopsies, b/l benign  . Abdominal hysterectomy 1982    partial still has ovaries, uterine prolapse and menorraghia   Family History  Problem Relation Age of Onset  . Adopted: Yes  . Cancer Mother   . Heart attack Father   . Heart disease Father   . Cancer Brother     colon  . Anxiety disorder Son    History   Social History  . Marital Status: Married    Spouse Name: N/A    Number of Children: N/A  . Years of Education: N/A   Occupational History  . Not on file.   Social History Main Topics  . Smoking status: Never Smoker   . Smokeless tobacco: Never Used  . Alcohol Use: No  . Drug Use: No  . Sexually Active: Yes -- Female partner(s)   Other Topics Concern  . Not on file   Social History Narrative  . No narrative on file    Review of Systems: Pertinent items are noted in HPI.  Physical Exam: Blood pressure 115/70, pulse 71, temperature 98.7 F (37.1 C), temperature source Oral, resp. rate 18, height 5\' 2"  (1.575 m), weight 157 lb (71.215 kg), SpO2 96.00%. General: resting in bed, sleeping when I enter but easily arousable.  HEENT: PERRL, EOMI, no scleral icterus Cardiac: S1 S2 heard Pulm: clear to auscultation bilaterally, moving normal volumes of air Abd: soft, nontender, nondistended, BS  present Ext: warm and well perfused, no pedal edema Neurologic Examination:  Mental Status:  Pt is speaking fluidly, without aphasia. Alert and oriented times 3. Follows  3 step commands. Will not sit up as this causes dizziness.    Cranial Nerves:  II: patient does respond to confrontation bilaterally, pupils right 3 mm, left 3 mm,and intact bilaterally  III,IV,VI: EOM intact except for mild upgaze restriction needed no nystagmus noted. No intranuclear ophthalmoplegia noted., pt wears glasses.  V,VII: facial muscles intact, sensation in the face intact and equal bilaterally VIII: hearing is decreased in the right ear., subjective vertigo present on head movements. IX,X: gag reflex present, XI: trapezius strength normal XII: tongue strength normal no deviation Motor:  Moves all 4 extremities spontaneously, strength 5/5 bilateral upper and lower extremity, no deficits Sensory:  Sensation to light touch intact and equal globally Deep Tendon Reflexes:  2+ throughout Plantars:  downgoing bilaterally  Cerebellar:  Deferred at this time    Lab results: Basic Metabolic Panel:  Basename 09/14/11 0202 09/14/11 0138  NA 142 139  K 3.1* 3.1*  CL 108 103  CO2 -- 21  GLUCOSE 157* 152*  BUN 22 22  CREATININE 0.90 0.81  CALCIUM -- 9.3  MG -- --  PHOS -- --  CBC:  Basename 09/14/11 0202 09/14/11 0138  WBC -- 10.0  NEUTROABS -- --  HGB 13.6 13.1  HCT 40.0 37.6  MCV -- 87.0  PLT -- 199   Cardiac Enzymes:  Basename 09/14/11 0139  CKTOTAL --  CKMB --  CKMBINDEX --  TROPONINI <0.30   Imaging results:  Ct Head Wo Contrast  09/14/2011  *RADIOLOGY REPORT*  Clinical Data: Dizziness, nausea, altered mental status.  Right sided hearing loss.  CT HEAD WITHOUT CONTRAST  Technique:  Contiguous axial images were obtained from the base of the skull through the vertex without contrast.  Comparison: None.  Findings: There is no evidence for acute hemorrhage, hydrocephalus, mass lesion, or  abnormal extra-axial fluid collection.  No definite CT evidence for acute infarction.  Mild cerebellar volume loss. The visualized paranasal sinuses and mastoid air cells are predominately clear.  IMPRESSION: Mild cerebellar atrophy.  No definite acute intracranial abnormality.  Original Report Authenticated By: Waneta Martins, M.D.     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 MRI/MRA ordered. Will follow up on results of studies tomorrow. Would recommend carotid doppler and Echo and will follow up on these results as well. Nausea is better controlled today and likely stemming from the vertigo.check fasting lipid profile, hemoglobin A1c had physical occupational therapy consults. Aspirin for stroke prevention.  I had a long discussion with the patient husband and multiple family members regarding her presentation, planned for evaluation treatment and answer questions. Active Problems:  Thyroid disease  Hyperlipidemia   Signed: Genella Mech 09/14/2011, 10:15 AM  I have personally examined this patient, reviewed the chart, participated in decision-making and treatment planning and discuss with family members and patient and answered questions.  Delia Heady, MD

## 2011-09-14 NOTE — ED Notes (Signed)
Pt denies any pain but she states her nausea is getting worse.  Dr Juleen China notified.

## 2011-09-14 NOTE — Progress Notes (Signed)
Speech Language/Pathology Received order for swallow eval for 12/19.  RN report pt very lethargic.  Pt will remain NPO today and SLP will assess tomorrow. Harlon Ditty, MA CCC-SLP [phone]

## 2011-09-14 NOTE — Progress Notes (Signed)
Utilization Review Completed.Molly Fleming T12/18/2012   

## 2011-09-14 NOTE — ED Notes (Signed)
Pt completely AO x 4.  States nausea decreased after phenergan.  Attempted to sit pt to perform swallow screen and she immediately became dizzy.  Swallow Screen failed b/c of that.

## 2011-09-14 NOTE — ED Notes (Addendum)
Pt keeps stating that she is sick and that her stomach hurts.  Pt non-diaphoretic, responsive to verbal stimuli.  Abd non-tender to palpation.  Able to move all extremities, no facial droop .  PERRL.

## 2011-09-14 NOTE — Progress Notes (Signed)
Patient seen and examined, still somnolent . Data, history, imaging and labs reviewed in detail and discussed with patient's husband at the bedside. - Agree with current management and plan per Dr. Toniann Fail. Rule out stroke, neurologist Dr. Anne Hahn consulted. - Replace potassium, UA negative for UTI. Continue IV fluids. Will follow  Molly Fleming,Molly Fleming 09/14/2011, 8:57 AM

## 2011-09-14 NOTE — ED Notes (Signed)
EMS called for dizziness.  At 9 pt became dizzy and diaphoretic accompanied by nvd. Pt began becoming confused around 2200 - Couple had just arrived from [place] today and pt had no idea where she was .  CBG of 135.  SL to L hand.  4 mg of zofran ivp.  VS - 62 nsr, borderline 1st deg block, 112/78, 99% RA. Perrl.  Pt diaphoretic.

## 2011-09-14 NOTE — ED Notes (Signed)
CT called.  I am accompanying pt to rm 1.

## 2011-09-14 NOTE — ED Notes (Addendum)
Potassium reduced to 50 ml/hr d/t burning pt arm.  IV fluids also increased to 200 ml/hr.

## 2011-09-14 NOTE — ED Notes (Signed)
PT now able to recognize son.  States diminished pain after urinating.  Pt incontinent of stool.  Appears excoriated in perineum.

## 2011-09-14 NOTE — ED Notes (Signed)
Dr. Kohut at bedside 

## 2011-09-14 NOTE — ED Provider Notes (Signed)
History    74yF with dizziness. Describes sensation of room moving. Onset shortly before arrival. Relatively constant. Cant find comfortable position. R ear fells full and ringing. Denies trauma Denies HA, just sensation in ear. No neck pain or stiffness. No fever or chills. Nausea and vomiting. Cant walk because of severity of symptoms. Per husband seems a little confused.  CSN: [id number] Arrival date & time: 09/14/2011  1:27 AM   None     Chief Complaint  Patient presents with  . Altered Mental Status    (Consider location/radiation/quality/duration/timing/severity/associated sxs/prior treatment) HPI  Past Medical History  Diagnosis Date  . Osteoporosis     started taking meds at age 75 then just went off recently- due to conflict she has heard  . Hyperlipidemia   . Allergy     seasonal  . Mumps as a child  . Measles   . Whooping cough as a child  . Cystic disease of breast   . Arthritis     right hand supplement  . Depression   . Thyroid disease     s/p thyroidectomy, goiter  . Actinic keratosis 08/04/2011  . Preventative health care 08/04/2011  . Frequent UTI 08/04/2011    Past Surgical History  Procedure Date  . Removed part of thyroid 2002  . Breast surgery     breast biopsies, b/l benign  . Abdominal hysterectomy 1982    partial still has ovaries, uterine prolapse and menorraghia    Family History  Problem Relation Age of Onset  . Adopted: Yes  . Cancer Mother   . Heart attack Father   . Heart disease Father   . Cancer Brother     colon  . Anxiety disorder Son     History  Substance Use Topics  . Smoking status: Never Smoker   . Smokeless tobacco: Never Used  . Alcohol Use: No    OB History    Grav Para Term Preterm Abortions TAB SAB Ect Mult Living                  Review of Systems   Review of symptoms negative unless otherwise noted in HPI.   Allergies  Sulfa antibiotics  Home Medications   Current Outpatient Rx  Name Route  Sig Dispense Refill  . ATORVASTATIN CALCIUM 10 MG PO TABS Oral Take 10 mg by mouth daily.      Marland Kitchen CALCIUM 600 + D PO Oral Take 1 tablet by mouth 2 (two) times daily.     Marland Kitchen CETIRIZINE HCL 10 MG PO TABS Oral Take 10 mg by mouth daily.      Marland Kitchen CIPROFLOXACIN HCL 500 MG PO TABS Oral Take 500 mg by mouth as needed.      Marland Kitchen FLUTICASONE PROPIONATE 50 MCG/ACT NA SUSP  1 spray each nostril daily 1 g 2  . LEVOTHYROXINE SODIUM 75 MCG PO TABS Oral Take 75 mcg by mouth daily.      . IMODIUM PO Oral Take by mouth as directed.      Marland Kitchen PAROXETINE HCL 10 MG PO TABS Oral Take 10 mg by mouth every morning.      Marland Kitchen VITAMIN B-12 1000 MCG PO TABS Oral Take 1,000 mcg by mouth daily.       BP 119/66  Pulse 66  Temp(Src) 97.5 F (36.4 C) (Oral)  Resp 18  SpO2 100%  Physical Exam  Nursing note and vitals reviewed. Constitutional: She appears well-developed and well-nourished. No distress.  HENT:  Head: Normocephalic and atraumatic.  Right Ear: External ear normal.  Left Ear: External ear normal.       TMS and ext aud canals clear. Negative hennebert's sign.  Eyes: Conjunctivae are normal. Pupils are equal, round, and reactive to light. Right eye exhibits no discharge. Left eye exhibits no discharge.  Neck: Normal range of motion. Neck supple.  Cardiovascular: Normal rate, regular rhythm and normal heart sounds.  Exam reveals no gallop and no friction rub.   No murmur heard. Pulmonary/Chest: Effort normal and breath sounds normal. No respiratory distress.  Abdominal: Soft. She exhibits no distension. There is no tenderness.  Musculoskeletal: Normal range of motion. She exhibits no edema and no tenderness.  Lymphadenopathy:    She has no cervical adenopathy.  Neurological: She is alert. She exhibits normal muscle tone.       Difficult to assess because pt just wants to lay still. Shuts eyes tightly when try to exam. No nystagmus appreciated. EOM appear intact best can assess. Pupils equal and reactive. No facial  droop. Moving all extremities. Good tone.  Skin: Skin is warm and dry.  Psychiatric: She has a normal mood and affect. Her behavior is normal. Thought content normal.    ED Course  Procedures (including critical care time)  Labs Reviewed  BASIC METABOLIC PANEL - Abnormal; Notable for the following:    Potassium 3.1 (*)    Glucose, Bld 152 (*)    GFR calc non Af Amer 71 (*)    GFR calc Af Amer 82 (*)    All other components within normal limits  URINALYSIS, ROUTINE W REFLEX MICROSCOPIC - Abnormal; Notable for the following:    APPearance CLOUDY (*)    Ketones, ur 40 (*)    All other components within normal limits  POCT I-STAT, CHEM 8 - Abnormal; Notable for the following:    Potassium 3.1 (*)    Glucose, Bld 157 (*)    Calcium, Ion 1.07 (*)    All other components within normal limits  CBC  TROPONIN I  I-STAT, CHEM 8   Ct Head Wo Contrast  09/14/2011  *RADIOLOGY REPORT*  Clinical Data: Dizziness, nausea, altered mental status.  Right sided hearing loss.  CT HEAD WITHOUT CONTRAST  Technique:  Contiguous axial images were obtained from the base of the skull through the vertex without contrast.  Comparison: None.  Findings: There is no evidence for acute hemorrhage, hydrocephalus, mass lesion, or abnormal extra-axial fluid collection.  No definite CT evidence for acute infarction.  Mild cerebellar volume loss. The visualized paranasal sinuses and mastoid air cells are predominately clear.  IMPRESSION: Mild cerebellar atrophy.  No definite acute intracranial abnormality.  Original Report Authenticated By: Waneta Martins, M.D.   EKG:  Rhythm: normal sinus Rate: 61 Axis: right Intervals: 1st degree AV block ST segments: normal  1:36 AM Pt assessed upon arrival to room. Describes true vertigo. Suspect peripheral cause of vertigo given violent nature of symptoms and tinnitus in R ear. Wouldn't expect MS change though. Difficult to assess if actually confused or just so nauseated  and sick feeling that appears this way.   1. Vertigo   2. Altered mental state   3. Hypokalemia       MDM  74yF with vertigo. Suspect peripheral cause, but given severe symptoms and ear fullness/tinnitus. Consider central causes. Doesn't seem to have positional component which would expect with peripheral etiology. Also would not expect mental status change. CT head neg but not most  sensitive testing modality, particularly posterior fossa. Admit for further testing and evaluation.        Raeford Razor, MD 09/22/11 [phone]

## 2011-09-14 NOTE — ED Notes (Signed)
PT now c/o suprapubic pain on palpation.

## 2011-09-14 NOTE — ED Notes (Signed)
Dr. Kakrakandy at bedside. 

## 2011-09-14 NOTE — Progress Notes (Signed)
*  PRELIMINARY RESULTS* Carotid dopplers performed. Preliminary findings showed no ICA stenosis. Antegrade vertebral artery flow bilateral.  Molly Fleming 09/14/2011, 11:26 AM

## 2011-09-14 NOTE — ED Notes (Signed)
Pt desaturated into the 70's after valium and responsive only to sternal rub.  Placed on 4L O2, placed in sitting position.  Shortly after, pt responding to verbal stimuli and sats in upper 90's on 4L.  Dr Juleen China notified and at bedside.

## 2011-09-15 ENCOUNTER — Encounter (HOSPITAL_COMMUNITY): Payer: Self-pay | Admitting: *Deleted

## 2011-09-15 DIAGNOSIS — I517 Cardiomegaly: Secondary | ICD-10-CM

## 2011-09-15 LAB — HEMOGLOBIN A1C
Hgb A1c MFr Bld: 5.2 % (ref <5.7)
Mean Plasma Glucose: 103 mg/dL (ref <117)

## 2011-09-15 LAB — LIPID PANEL
LDL Cholesterol: 90 mg/dL (ref 0–99)
Triglycerides: 91 mg/dL (ref <150)

## 2011-09-15 LAB — CARDIAC PANEL(CRET KIN+CKTOT+MB+TROPI)
CK, MB: 1.8 ng/mL (ref 0.3–4.0)
Relative Index: INVALID (ref 0.0–2.5)
Total CK: 69 U/L (ref 7–177)

## 2011-09-15 MED ORDER — SODIUM CHLORIDE 0.9 % IV SOLN
250.0000 mL | INTRAVENOUS | Status: DC | PRN
  Administered 2011-09-16: 500 mL via INTRAVENOUS

## 2011-09-15 MED ORDER — ACETAMINOPHEN 325 MG PO TABS: 650.0000 mg | ORAL_TABLET | ORAL | Status: DC | PRN

## 2011-09-15 MED ORDER — FENTANYL CITRATE 0.05 MG/ML IJ SOLN
INTRAMUSCULAR | Status: AC
  Filled 2011-09-15: qty 2

## 2011-09-15 MED ORDER — BENZOCAINE 20 % MT SOLN
1.0000 "application " | OROMUCOSAL | Status: DC | PRN
  Filled 2011-09-15: qty 57

## 2011-09-15 MED ORDER — VALPROATE SODIUM 500 MG/5ML IV SOLN
250.0000 mg | Freq: Two times a day (BID) | INTRAVENOUS | Status: DC
  Administered 2011-09-15 – 2011-09-16 (×3): 250 mg via INTRAVENOUS
  Filled 2011-09-15 (×5): qty 2.5

## 2011-09-15 MED ORDER — MIDAZOLAM HCL 10 MG/2ML IJ SOLN
INTRAMUSCULAR | Status: AC
  Filled 2011-09-15: qty 2

## 2011-09-15 MED ORDER — ONDANSETRON HCL 4 MG/2ML IJ SOLN: 4.0000 mg | Freq: Four times a day (QID) | INTRAMUSCULAR | Status: DC | PRN

## 2011-09-15 MED ORDER — ACETAMINOPHEN 650 MG RE SUPP: 650.0000 mg | RECTAL | Status: DC | PRN

## 2011-09-15 MED ORDER — SODIUM CHLORIDE 0.9 % IJ SOLN: 3.0000 mL | INTRAMUSCULAR | Status: DC | PRN

## 2011-09-15 MED ORDER — FENTANYL CITRATE 0.05 MG/ML IJ SOLN: 250.0000 ug | Freq: Once | INTRAMUSCULAR | Status: DC

## 2011-09-15 MED ORDER — ASPIRIN 325 MG PO TABS
325.0000 mg | ORAL_TABLET | Freq: Every day | ORAL | Status: DC
  Administered 2011-09-16 – 2011-09-20 (×5): 325 mg via ORAL
  Filled 2011-09-15 (×6): qty 1

## 2011-09-15 MED ORDER — ASPIRIN 300 MG RE SUPP
300.0000 mg | Freq: Every day | RECTAL | Status: DC
  Administered 2011-09-15: 300 mg via RECTAL
  Filled 2011-09-15: qty 1

## 2011-09-15 MED ORDER — SODIUM CHLORIDE 0.9 % IJ SOLN
3.0000 mL | Freq: Two times a day (BID) | INTRAMUSCULAR | Status: DC
  Administered 2011-09-15 – 2011-09-16 (×2): 3 mL via INTRAVENOUS

## 2011-09-15 MED ORDER — ONDANSETRON HCL 4 MG/2ML IJ SOLN
4.0000 mg | Freq: Four times a day (QID) | INTRAMUSCULAR | Status: DC
  Administered 2011-09-15 – 2011-09-17 (×8): 4 mg via INTRAVENOUS
  Filled 2011-09-15 (×8): qty 2

## 2011-09-15 MED ORDER — MIDAZOLAM HCL 10 MG/2ML IJ SOLN: 10.0000 mg | Freq: Once | INTRAMUSCULAR | Status: DC

## 2011-09-15 MED ORDER — VALPROATE SODIUM 500 MG/5ML IV SOLN
750.0000 mg | Freq: Once | INTRAVENOUS | Status: DC
  Administered 2011-09-15: 750 mg via INTRAVENOUS
  Filled 2011-09-15: qty 7.5

## 2011-09-15 NOTE — Progress Notes (Signed)
Stroke Team Progress Note  SUBJECTIVE  Molly Fleming is a 75 y.o. female who presented to the ED with new onset dizziness causing nausea and intactible vomiting. This dizziness started the night of admission. That morning she was feeling not her usual self. She has not otherwise been sick recently. She was complaining of abdominal discomfort upon presentation and was catheterized in the ED and had partial relief of the pain. She currently is having discomfort in her abdomen but not pain. Still having vertigo and is unable to sit up. Denied SOB, chest pain, leg pain. She lost hearing in her right ear about half-an-hour before she became dizzy yesterday. She also states that she cannot remember the events of yesterday evening quite well until this morning. The family also feel that she seemed forgetful and was this disoriented and confused last night. She is feeling better today except for dizziness when she tries to move in th ED.    Her husband is at the bedside. Overall she feels her condition is gradually worsening. She complains of a severe headache, nausea which persisit.  OBJECTIVE Most recent Vital Signs: Temp: 98.1 F (36.7 C) (12/19 0800) Temp src: Oral (12/19 0800) BP: 145/78 mmHg (12/19 0800) Pulse Rate: 66  (12/19 0800) Respiratory Rate: 20 O2 Saturdation: 98%  CBG (last 3)  No results found for this basename: GLUCAP:3 in the last 72 hours Intake/Output from previous day:    IV Fluid Intake:     . sodium chloride 100 mL/hr at 09/15/11 0401   Medications    . sodium chloride   Intravenous STAT  . fluticasone  1 spray Each Nare Daily  . levothyroxine  75 mcg Oral QAC breakfast  . loratadine  10 mg Oral Daily  . PARoxetine  10 mg Oral Q0700  . potassium chloride  10 mEq Intravenous Q1 Hr x 3  . simvastatin  20 mg Oral q1800  . vitamin B-12  1,000 mcg Oral Daily   Diet:  NPO  Activity:  Up with assistance DVT Prophylaxis:  SCDs   Studies: CBC    Component Value  Date/Time   WBC 11.1* 09/14/2011 0850   RBC 4.31 09/14/2011 0850   HGB 12.7 09/14/2011 0850   HCT 38.1 09/14/2011 0850   PLT 217 09/14/2011 0850   MCV 88.4 09/14/2011 0850   MCH 29.5 09/14/2011 0850   MCHC 33.3 09/14/2011 0850   RDW 12.5 09/14/2011 0850   CMP    Component Value Date/Time   NA 140 09/14/2011 0850   K 4.0 09/14/2011 0850   CL 106 09/14/2011 0850   CO2 22 09/14/2011 0850   GLUCOSE 114* 09/14/2011 0850   BUN 15 09/14/2011 0850   CREATININE 0.73 09/14/2011 0850   CALCIUM 8.1* 09/14/2011 0850   PROT 7.2 09/14/2011 0850   ALBUMIN 3.7 09/14/2011 0850   AST 25 09/14/2011 0850   ALT 23 09/14/2011 0850   ALKPHOS 64 09/14/2011 0850   BILITOT 0.3 09/14/2011 0850   GFRNONAA 83* 09/14/2011 0850   GFRAA >90 09/14/2011 0850   COAGS No results found for this basename: INR, PROTIME   Lipid Panel    Component Value Date/Time   CHOL 161 09/15/2011 0237   TRIG 91 09/15/2011 0237   HDL 53 09/15/2011 0237   CHOLHDL 3.0 09/15/2011 0237   VLDL 18 09/15/2011 0237   LDLCALC 90 09/15/2011 0237   HgbA1C  No results found for this basename: HGBA1C   Urine Drug Screen  No results found  for this basename: labopia, cocainscrnur, labbenz, amphetmu, thcu, labbarb    Alcohol Level No results found for this basename: eth     Ct Abdomen Pelvis Wo Contrast 1.  Jejunal mesenteric findings which could represent mild mesenteric adenitis/panniculitis. 2.  Otherwise, no acute process in the abdomen or pelvis.    CT of the brain  Mild cerebellar atrophy.  No definite acute intracranial abnormality.    MRI of the brain   Anterior left temporal lobe and medial right temporal lobe small acute infarcts.  Embolic disease not excluded given the distribution of these infarcts.  Please see above.  Mild small vessel disease type changes.  Moderate paranasal sinus mucosal thickening diffusely.  MRA of the brain  Mild branch vessel irregularity.    2D Echocardiogram  ordered   Carotid  Doppler  No internal carotid artery stenosis bilaterally. Vertebrals with antegrade flow bilaterally.   CXR   Trace right pleural fluid or thickening.  Otherwise, no acute finding.  EKG  normal sinus rhythm, right axis deviation.   Physical Exam    Mental Status:  Pt is speaking fluidly, without aphasia. Alert and oriented times 3. Follows 3 step commands. Will not sit up as this causes dizziness.  Cranial Nerves:  II: patient does respond to confrontation bilaterally, pupils right 3 mm, left 3 mm,and intact bilaterally  III,IV,VI: EOM intact except for mild upgaze restriction needed no nystagmus noted. No intranuclear ophthalmoplegia noted   V,VII: facial muscles intact, sensation in the face intact and equal bilaterally  VIII: hearing is decreased in the right ear., subjective vertigo present on head movements.  IX,X: gag reflex present, XI: trapezius strength normal  XII: tongue strength normal no deviation  Motor:  Moves all 4 extremities spontaneously, strength 5/5 bilateral upper and lower extremity, no deficits  Sensory:  Sensation to light touch intact and equal globally  Deep Tendon Reflexes:  2+ throughout  Plantars:  downgoing bilaterally  Cerebellar:  Deferred at this time     ASSESSMENT Ms. Molly Fleming is a 75 y.o. female with bilateral temporal lobe infarcts, very small. Temporal lobe infarcts can be associated with memory loss. Etiology of stroke likely embolic but source unknown. Workup underway. Not on antiplatelets for secondary stroke prevention.  Stroke risk factors:  hyperlipidemia and recent long car ride (11hr)  Hospital day # 1  TREATMENT/PLAN Add aspirin 300 rectally every day for secondary stroke prevention. TEE to look for embolic source. If positive for PFO (patent foramen ovale), needs bilateral lower extremity dopplers to rule out DVT as source of stroke. Zofran for nausea. Tylenol supp and depacon IV for headache. Keep NPO for likley TEE today  (if not today, will do in am) - called [place].Discussed with patient and husband and answered questions  Joaquin Music, ANP-BC, GNP-BC [facility] Pager: [phone] 09/15/2011 9:23 AM  Dr. Delia Heady, Stroke Center Medical Director, has personally reviewed chart, pertinent data, examined the patient and developed the plan of care.

## 2011-09-15 NOTE — Progress Notes (Signed)
Subjective: Chart reviewed. Patient complains of headache beginning at the back of the neck to the back of her head, photophobia and nausea. She vomited twice yesterday. She indicates that the headache is similar to her migraine headaches which occur frequently but is more severe. Headaches are usually resolved with extra strength Tylenol or an aspirin.  Objective: Blood pressure 145/78, pulse 66, temperature 98.1 F (36.7 C), temperature source Oral, resp. rate 20, height 5' 2" (1.575 m), weight 71.215 kg (157 lb), SpO2 98.00%.  Intake/Output Summary (Last 24 hours) at 09/15/11 1028 Last data filed at 09/15/11 0900  Gross per 24 hour  Intake      0 ml  Output    400 ml  Net   -400 ml   General exam: In painful distress and holding her head. Respiratory system: Clear. No increased work of breathing. Cardiovascular system: Telemetry shows sinus rhythm. First and second heart sounds heard, regular. No JVD or murmurs. Gastrointestinal system: Abdomen is nondistended, soft and normal bowel sounds heard. Central system: Alert and oriented. No focal neurological deficits.  Lab Results: Basic Metabolic Panel:  Basename 09/14/11 0850 09/14/11 0202 09/14/11 0138  NA 140 142 --  K 4.0 3.1* --  CL 106 108 --  CO2 22 -- 21  GLUCOSE 114* 157* --  BUN 15 22 --  CREATININE 0.73 0.90 --  CALCIUM 8.1* -- 9.3  MG 1.7 -- --  PHOS -- -- --   Liver Function Tests:  Basename 09/14/11 0850  AST 25  ALT 23  ALKPHOS 64  BILITOT 0.3  PROT 7.2  ALBUMIN 3.7    Basename 09/14/11 0850  LIPASE 21  AMYLASE --   No results found for this basename: AMMONIA:2 in the last 72 hours CBC:  Basename 09/14/11 0850 09/14/11 0202 09/14/11 0138  WBC 11.1* -- 10.0  NEUTROABS -- -- --  HGB 12.7 13.6 --  HCT 38.1 40.0 --  MCV 88.4 -- 87.0  PLT 217 -- 199   Cardiac Enzymes:  Basename 09/15/11 0235 09/14/11 1843 09/14/11 0852  CKTOTAL 69 86 112  CKMB 1.8 2.3 2.8  CKMBINDEX -- -- --  TROPONINI <0.30  <0.30 <0.30   Fasting Lipid Panel:  Basename 09/15/11 0237  CHOL 161  HDL 53  LDLCALC 90  TRIG 91  CHOLHDL 3.0  LDLDIRECT --    Studies/Results: Ct Abdomen Pelvis Wo Contrast  09/14/2011  *RADIOLOGY REPORT*  Clinical Data: Nausea vomiting since last night.  Epigastric pain. Prior hysterectomy.  CT ABDOMEN AND PELVIS WITHOUT CONTRAST  Technique:  Multidetector CT imaging of the abdomen and pelvis was performed following the standard protocol without intravenous contrast.  Comparison: None.  Findings: Patchy subsegmental atelectasis at the lung bases bilaterally. Normal heart size without pericardial or pleural effusion.  Bilateral pleural thickening is mild.  Normal uninfused appearance of the liver, spleen, stomach, pancreas, gallbladder, biliary tract, adrenal glands, kidneys. No retroperitoneal or retrocrural adenopathy.  Normal colon, appendix, and terminal ileum.  Small jejunal mesenteric nodes and mild increased density within the mesenteric fat.  Example image 27.  Small bowel is otherwise within normal limits, without ascites, pneumatosis, or free intraperitoneal air.  No pelvic adenopathy.    Normal urinary bladder.  Hysterectomy.  No adnexal mass.  No significant free fluid.  Degenerative disc disease at the lumbosacral junction.  IMPRESSION:  1.  Jejunal mesenteric findings which could represent mild mesenteric adenitis/panniculitis. 2.  Otherwise, no acute process in the abdomen or pelvis.  Original Report Authenticated By:   KYLE D. TALBOT, M.D.   Dg Chest 1 View  09/14/2011  *RADIOLOGY REPORT*  Clinical Data: Weakness and dizziness.  Nausea.  Headache.  CHEST - 1 VIEW  Comparison: None.  Findings: Surgical clips in the left paratracheal region.  Patient rotated minimally left. Normal heart size.  Minimal right costophrenic angle blunting. No pneumothorax.  Mild nonspecific interstitial thickening without acute pulmonary abnormality.  IMPRESSION: Trace right pleural fluid or thickening.   Otherwise, no acute finding.  Original Report Authenticated By: KYLE D. TALBOT, M.D.   Ct Head Wo Contrast  09/14/2011  *RADIOLOGY REPORT*  Clinical Data: Dizziness, nausea, altered mental status.  Right sided hearing loss.  CT HEAD WITHOUT CONTRAST  Technique:  Contiguous axial images were obtained from the base of the skull through the vertex without contrast.  Comparison: None.  Findings: There is no evidence for acute hemorrhage, hydrocephalus, mass lesion, or abnormal extra-axial fluid collection.  No definite CT evidence for acute infarction.  Mild cerebellar volume loss. The visualized paranasal sinuses and mastoid air cells are predominately clear.  IMPRESSION: Mild cerebellar atrophy.  No definite acute intracranial abnormality.  Original Report Authenticated By: ANDREW J. DELGAIZO, M.D.   Mr Brain Wo Contrast  09/15/2011  *RADIOLOGY REPORT*  Clinical Data:  Dizziness with nausea.  Hyperlipidemia.  Chronic recurrent urinary tract infection.  MRI HEAD WITHOUT CONTRAST MRA HEAD WITHOUT CONTRAST IMPRESSION: Anterior left temporal lobe and medial right temporal lobe small acute infarcts.  Embolic disease not excluded given the distribution of these infarcts.  Please see above.  Mild small vessel disease type changes.  Moderate paranasal sinus mucosal thickening diffusely.  MRA HEAD  IMPRESSION: Mild branch vessel irregularity.  Please see above.  Original Report Authenticated By: STEVEN R. OLSON, M.D.   Medications: Scheduled Meds:   . sodium chloride   Intravenous STAT  . fluticasone  1 spray Each Nare Daily  . levothyroxine  75 mcg Oral QAC breakfast  . loratadine  10 mg Oral Daily  . PARoxetine  10 mg Oral Q0700  . potassium chloride  10 mEq Intravenous Q1 Hr x 3  . simvastatin  20 mg Oral q1800  . vitamin B-12  1,000 mcg Oral Daily   Continuous Infusions:   . sodium chloride 100 mL/hr at 09/15/11 0401   PRN Meds:.ondansetron (ZOFRAN) IV,  senna-docusate  Assessment/Plan: 1. Dizziness, hearing deficit and transient confusion on admission: MRI of the head shows Anterior left temporal lobe and medial right temporal lobe small acute infarcts. This finding however does not explain her symptoms. Neurology input appreciated. We'll follow their recommendations. Vertigo seems to be better. Carotid Dopplers showed no ICA stenosis. Hemoglobin A1c is in process. Echocardiogram is pending 2. Headache,? Secondary to migraine. Try Tylenol. Neurology starting Depakote. 3. Hypokalemia: Repleted 4. Hyperlipidemia 5. Status post thyroidectomy: Continue Synthroid.   Molly Fleming 09/15/2011, 10:28 AM 

## 2011-09-15 NOTE — Progress Notes (Signed)
OT eval attempted; however, pt. Sleeping soundly, and unable to adequately arouse.  Will re-attempt 09/16/11.  Jeani Hawking, OTR/L [phone]

## 2011-09-15 NOTE — Progress Notes (Signed)
Endo RN called and stated patient is too nauseated to preform TEE.  Manuela Neptune, NP called and notified NP also asked to re enter orders for TEE and make diet changes. Will continue to monitor

## 2011-09-15 NOTE — Progress Notes (Signed)
Physical Therapy Evaluation Patient Details Name: TALLULAH HOSMAN MRN: [id number] DOB: 28-Aug-1936 Today's Date: 09/15/2011  Problem List:  Patient Active Problem List  Diagnoses  . Osteoporosis  . Thyroid disease  . Depression  . Hyperlipidemia  . Allergic state  . Arthritis  . Cystic disease of breast  . Actinic keratosis  . Preventative health care  . Frequent UTI  . Dizziness    Past Medical History:  Past Medical History  Diagnosis Date  . Osteoporosis     started taking meds at age 57 then just went off recently- due to conflict she has heard  . Hyperlipidemia   . Allergy     seasonal  . Mumps as a child  . Measles   . Whooping cough as a child  . Cystic disease of breast   . Arthritis     right hand supplement  . Depression   . Thyroid disease     s/p thyroidectomy, goiter  . Actinic keratosis 08/04/2011  . Preventative health care 08/04/2011  . Frequent UTI 08/04/2011   Past Surgical History:  Past Surgical History  Procedure Date  . Removed part of thyroid 2002  . Breast surgery     breast biopsies, b/l benign  . Abdominal hysterectomy 1982    partial still has ovaries, uterine prolapse and menorraghia    PT Assessment/Plan/Recommendation PT Assessment Clinical Impression Statement: pt presents with nausea, vomiting, and headache.  MRI notes 2 small Bil Temporal Infarcts, but more testing to be doen to determine cause of dizziness and vomiting.  Eval was limited secondary to sitting EOB pt became very dizzy, no nystagmus noted, and began violently puking.  Will continue to follow.   PT Recommendation/Assessment: Patient will need skilled PT in the acute care venue PT Problem List: Decreased activity tolerance;Decreased balance;Decreased mobility;Decreased knowledge of use of DME;Cardiopulmonary status limiting activity Barriers to Discharge: None PT Therapy Diagnosis : Difficulty walking PT Plan PT Frequency: Min 4X/week PT Treatment/Interventions:  DME instruction;Gait training;Stair training;Functional mobility training;Therapeutic activities;Therapeutic exercise;Balance training;Neuromuscular re-education;Patient/family education PT Recommendation Recommendations for Other Services: OT consult Follow Up Recommendations:  (TBD) Equipment Recommended:  (TBD) PT Goals  Acute Rehab PT Goals PT Goal Formulation: With patient/family Time For Goal Achievement: 2 weeks Pt will go Supine/Side to Sit: Independently PT Goal: Supine/Side to Sit - Progress: Progressing toward goal Pt will go Sit to Supine/Side: Independently PT Goal: Sit to Supine/Side - Progress: Progressing toward goal Pt will go Sit to Stand: Independently PT Goal: Sit to Stand - Progress: Progressing toward goal Pt will Ambulate: >150 feet;Independently PT Goal: Ambulate - Progress: Progressing toward goal Pt will Go Up / Down Stairs: 3-5 stairs;Independently PT Goal: Up/Down Stairs - Progress: Progressing toward goal  PT Evaluation Precautions/Restrictions  Precautions Precautions: Fall Restrictions Weight Bearing Restrictions: No Prior Functioning  Home Living Lives With: Spouse Receives Help From: Family Type of Home: House Home Layout: Two level;Able to live on main level with bedroom/bathroom Home Access: Stairs to enter Entrance Stairs-Rails: None Entrance Stairs-Number of Steps: 2 Home Adaptive Equipment: None Prior Function Level of Independence: Independent with basic ADLs;Independent with homemaking with ambulation;Independent with gait;Independent with transfers Able to Take Stairs?: Yes Driving: Yes Vocation: Retired Designer, television/film set Level: Oriented X4 Sensation/Coordination   Extremity Assessment RLE Assessment RLE Assessment:  (Unable to asses secondary to violently puking) LLE Assessment LLE Assessment:  (Unable to assesss secondary to violently puking) Mobility (including Balance) Bed Mobility Bed Mobility:  Yes Rolling Right: 7: Independent Right  Sidelying to Sit: 7: Independent Sit to Supine - Right: 7: Independent Transfers Transfers: No Ambulation/Gait Ambulation/Gait: No Stairs: No Wheelchair Mobility Wheelchair Mobility: No    Exercise    End of Session PT - End of Session Equipment Utilized During Treatment:  (None) Activity Tolerance:  (Limited by nausea and dizziness.  ) Patient left: in bed;with call bell in reach;with family/visitor present Nurse Communication:  (RN aware of nausea and limited eval.  ) General Behavior During Session: [facility] for tasks performed Cognition: [facility] for tasks performed  [name], [place] [phone] 09/15/2011, 12:09 PM

## 2011-09-15 NOTE — Progress Notes (Signed)
  Echocardiogram 2D Echocardiogram has been performed.  Jorje [facility] 09/15/2011, 11:37 AM

## 2011-09-15 NOTE — Progress Notes (Signed)
Speech Language Pathology Clinical/Bedside Swallow Evaluation Patient Details  Name: Molly Fleming MRN: [id number] DOB: 04-11-1936 Today's Date: 09/15/2011  Past Medical History:  Past Medical History  Diagnosis Date  . Osteoporosis     started taking meds at age 75 then just went off recently- due to conflict she has heard  . Hyperlipidemia   . Allergy     seasonal  . Mumps as a child  . Measles   . Whooping cough as a child  . Cystic disease of breast   . Arthritis     right hand supplement  . Depression   . Thyroid disease     s/p thyroidectomy, goiter  . Actinic keratosis 08/04/2011  . Preventative health care 08/04/2011  . Frequent UTI 08/04/2011   HPI  75 y.o. female admitted on 09/14/11 with 1 day of fullness in the right ear and started complaining dizziness with eventual vomiting multiple times, confusion and abdominal pain.  MRI on 12/18 showed anterior left temporal lobe and medial right temporal lobe.  Assessment/Recommendations/Treatment Plan Clinical Impression Statement: Demonstrates a functional oral-pharyngeal swallow without any overt clinical indicators of a dysphagia or aspiration.  Although patient was more cognitively at baseline, her 10/10 posterior headache interefered with her participation.  Recommendations 1. Regular & Thin liquids 2. Whole meds with liquid 3. Follow up Recommendations: None  Myra Rude, M.S.,CCC-SLP Pager [phone]

## 2011-09-16 ENCOUNTER — Encounter (HOSPITAL_COMMUNITY)
Admission: EM | Disposition: A | Discharge status: Home Health | Payer: Self-pay | Source: Home / Self Care | Attending: Internal Medicine

## 2011-09-16 ENCOUNTER — Encounter (HOSPITAL_COMMUNITY): Payer: Self-pay | Admitting: *Deleted

## 2011-09-16 DIAGNOSIS — I6789 Other cerebrovascular disease: Secondary | ICD-10-CM

## 2011-09-16 DIAGNOSIS — Z9889 Other specified postprocedural states: Secondary | ICD-10-CM

## 2011-09-16 HISTORY — PX: TEE WITHOUT CARDIOVERSION: SHX5443

## 2011-09-16 SURGERY — ECHOCARDIOGRAM, TRANSESOPHAGEAL
Anesthesia: Moderate Sedation

## 2011-09-16 MED ORDER — MIDAZOLAM HCL 10 MG/2ML IJ SOLN
INTRAMUSCULAR | Status: DC | PRN
  Administered 2011-09-16 (×2): 2 mg via INTRAVENOUS

## 2011-09-16 MED ORDER — LIDOCAINE VISCOUS 2 % MT SOLN
OROMUCOSAL | Status: DC | PRN
  Administered 2011-09-16: 10 mL via OROMUCOSAL

## 2011-09-16 MED ORDER — FENTANYL CITRATE 0.05 MG/ML IJ SOLN
INTRAMUSCULAR | Status: AC
  Filled 2011-09-16: qty 2

## 2011-09-16 MED ORDER — LIDOCAINE VISCOUS 2 % MT SOLN
OROMUCOSAL | Status: AC
  Filled 2011-09-16: qty 15

## 2011-09-16 MED ORDER — FENTANYL CITRATE 0.05 MG/ML IJ SOLN
INTRAMUSCULAR | Status: DC | PRN
  Administered 2011-09-16 (×2): 25 ug via INTRAVENOUS

## 2011-09-16 MED ORDER — MIDAZOLAM HCL 10 MG/2ML IJ SOLN
INTRAMUSCULAR | Status: AC
  Filled 2011-09-16: qty 2

## 2011-09-16 NOTE — Progress Notes (Signed)
*  PRELIMINARY RESULTS*  BLEV Duplex has been performed.  No obvious evidence of deep vein thrombosis bilaterally. No evidence of a Baker's Cyst bilaterally.  Molly Fleming 09/16/2011, 3:09 PM

## 2011-09-16 NOTE — Progress Notes (Signed)
Subjective: Patient says that she's feeling better than yesterday. She complains of about 5-6/10 occipital headache. Photophobia has resolved. Her nausea and vomiting and significantly improved and she has not vomited today. She still gets quite dizzy on sitting up but denies vertigo. She's not sure about how much hearing she has in her right ear. According to her spouse was at the bedside patient looks a whole lot better today than on admission. He also indicates that she has no recollection of the events which brought her to the hospital until yesterday.  Objective: Blood pressure 152/82, pulse 57, temperature 97.9 F (36.6 C), temperature source Oral, resp. rate 16, height 5\' 2"  (1.575 m), weight 71.215 kg (157 lb), SpO2 95.00%.  Intake/Output Summary (Last 24 hours) at 09/16/11 1445 Last data filed at 09/16/11 0900  Gross per 24 hour  Intake      0 ml  Output    900 ml  Net   -900 ml   General exam: Lying comfortably supine in bed and in no obvious distress.Marland Kitchen Respiratory system: Clear. No increased work of breathing. Cardiovascular system: Telemetry shows sinus rhythm in the 60s to sinus bradycardia in the high 50s. First and second heart sounds heard, regular. No JVD or murmurs. Gastrointestinal system: Abdomen is nondistended, soft and normal bowel sounds heard. Central system: Alert and oriented. No focal neurological deficits.  Lab Results: Basic Metabolic Panel:  Basename 09/14/11 0850 09/14/11 0202 09/14/11 0138  NA 140 142 --  K 4.0 3.1* --  CL 106 108 --  CO2 22 -- 21  GLUCOSE 114* 157* --  BUN 15 22 --  CREATININE 0.73 0.90 --  CALCIUM 8.1* -- 9.3  MG 1.7 -- --  PHOS -- -- --   Liver Function Tests:  [facility] 09/14/11 0850  AST 25  ALT 23  ALKPHOS 64  BILITOT 0.3  PROT 7.2  ALBUMIN 3.7    Basename 09/14/11 0850  LIPASE 21  AMYLASE --   No results found for this basename: AMMONIA:2 in the last 72 hours CBC:  Basename 09/14/11 0850 09/14/11 0202  09/14/11 0138  WBC 11.1* -- 10.0  NEUTROABS -- -- --  HGB 12.7 13.6 --  HCT 38.1 40.0 --  MCV 88.4 -- 87.0  PLT 217 -- 199   Cardiac Enzymes:  Basename 09/15/11 0235 09/14/11 1843 09/14/11 0852  CKTOTAL 69 86 112  CKMB 1.8 2.3 2.8  CKMBINDEX -- -- --  TROPONINI <0.30 <0.30 <0.30   Fasting Lipid Panel:  Basename 09/15/11 0237  CHOL 161  HDL 53  LDLCALC 90  TRIG 91  CHOLHDL 3.0  LDLDIRECT --    Studies/Results: Ct Abdomen Pelvis Wo Contrast  09/14/2011  *RADIOLOGY REPORT*  Clinical Data: Nausea vomiting since last night.  Epigastric pain. Prior hysterectomy.  CT ABDOMEN AND PELVIS WITHOUT CONTRAST  Technique:  Multidetector CT imaging of the abdomen and pelvis was performed following the standard protocol without intravenous contrast.  Comparison: None.  Findings: Patchy subsegmental atelectasis at the lung bases bilaterally. Normal heart size without pericardial or pleural effusion.  Bilateral pleural thickening is mild.  Normal uninfused appearance of the liver, spleen, stomach, pancreas, gallbladder, biliary tract, adrenal glands, kidneys. No retroperitoneal or retrocrural adenopathy.  Normal colon, appendix, and terminal ileum.  Small jejunal mesenteric nodes and mild increased density within the mesenteric fat.  Example image 27.  Small bowel is otherwise within normal limits, without ascites, pneumatosis, or free intraperitoneal air.  No pelvic adenopathy.    Normal urinary  bladder.  Hysterectomy.  No adnexal mass.  No significant free fluid.  Degenerative disc disease at the lumbosacral junction.  IMPRESSION:  1.  Jejunal mesenteric findings which could represent mild mesenteric adenitis/panniculitis. 2.  Otherwise, no acute process in the abdomen or pelvis.  Original Report Authenticated By: Consuello Bossier, M.D.   Dg Chest 1 View  09/14/2011  *RADIOLOGY REPORT*  Clinical Data: Weakness and dizziness.  Nausea.  Headache.  CHEST - 1 VIEW  Comparison: None.  Findings: Surgical  clips in the left paratracheal region.  Patient rotated minimally left. Normal heart size.  Minimal right costophrenic angle blunting. No pneumothorax.  Mild nonspecific interstitial thickening without acute pulmonary abnormality.  IMPRESSION: Trace right pleural fluid or thickening.  Otherwise, no acute finding.  Original Report Authenticated By: Consuello Bossier, M.D.   Ct Head Wo Contrast  09/14/2011  *RADIOLOGY REPORT*  Clinical Data: Dizziness, nausea, altered mental status.  Right sided hearing loss.  CT HEAD WITHOUT CONTRAST  Technique:  Contiguous axial images were obtained from the base of the skull through the vertex without contrast.  Comparison: None.  Findings: There is no evidence for acute hemorrhage, hydrocephalus, mass lesion, or abnormal extra-axial fluid collection.  No definite CT evidence for acute infarction.  Mild cerebellar volume loss. The visualized paranasal sinuses and mastoid air cells are predominately clear.  IMPRESSION: Mild cerebellar atrophy.  No definite acute intracranial abnormality.  Original Report Authenticated By: Waneta Martins, M.D.   Mr Brain Wo Contrast  09/15/2011  *RADIOLOGY REPORT*  Clinical Data:  Dizziness with nausea.  Hyperlipidemia.  Chronic recurrent urinary tract infection.  MRI HEAD WITHOUT CONTRAST MRA HEAD WITHOUT CONTRAST IMPRESSION: Anterior left temporal lobe and medial right temporal lobe small acute infarcts.  Embolic disease not excluded given the distribution of these infarcts.  Please see above.  Mild small vessel disease type changes.  Moderate paranasal sinus mucosal thickening diffusely.  MRA HEAD  IMPRESSION: Mild branch vessel irregularity.  Please see above.  Original Report Authenticated By: Fuller Canada, M.D.   Medications: Scheduled Meds:    . aspirin  325 mg Oral Daily  . fentaNYL  250 mcg Intravenous Once  . fluticasone  1 spray Each Nare Daily  . levothyroxine  75 mcg Oral QAC breakfast  . loratadine  10 mg Oral  Daily  . midazolam  10 mg Intravenous Once  . ondansetron (ZOFRAN) IV  4 mg Intravenous Q6H  . PARoxetine  10 mg Oral Q0700  . simvastatin  20 mg Oral q1800  . sodium chloride  3 mL Intravenous Q12H  . valproate sodium  250 mg Intravenous Q12H  . vitamin B-12  1,000 mcg Oral Daily   Continuous Infusions:  PRN Meds:.sodium chloride, acetaminophen, benzocaine, senna-docusate, sodium chloride  Assessment/Plan: 1. Dizziness, hearing deficit and transient confusion on admission: MRI of the head shows bilateral temporal lobe small acute infarcts. This finding however does not explain her symptoms. Discussed with neurology who indicate that she probably had both a brainstem as well as bitemporal infarcts. Patient is awaiting TEE and bilateral lower extremity venous Dopplers. Inpatient rehabilitation is recommended. 2-D echocardiogram shows mild left ventricular hypertrophy with estimated left ventricular ejection fraction of 60%. 2. Headache,? Secondary to migraine. Better after Depakote. 3. Hypokalemia: Repleted 4. Hyperlipidemia 5. Status post thyroidectomy: Continue Synthroid. Discussed with patient's spouse and son at the bedside.  Dolton Shaker 09/16/2011, 2:45 PM

## 2011-09-16 NOTE — Op Note (Signed)
    TRANSESOPHAGEAL ECHOCARDIOGRAM   NAME:  Molly Fleming   MRN: [id number] DOB:  [birth year]   ADMIT DATE: 09/14/2011  INDICATIONS:   Stroke   PROCEDURE:   Informed consent was obtained prior to the procedure. The risks, benefits and alternatives for the procedure were discussed and the patient comprehended these risks.  Risks include, but are not limited to, cough, sore throat, vomiting, nausea, somnolence, esophageal and stomach trauma or perforation, bleeding, low blood pressure, aspiration, pneumonia, infection, trauma to the teeth and death.    After a procedural time-out, the patient was given 4 mg versed and 50 mcg fentanyl for moderate sedation.  The oropharynx was anesthetized 10 cc of topical 1% viscous lidocaine.  The transesophageal probe was inserted in the esophagus and stomach without difficulty and multiple views were obtained.   Agitated microbubble saline contrast was administered.  COMPLICATIONS:    There were no immediate complications.  FINDINGS:  LEFT VENTRICLE: EF = 60-65% No wall motion abnormalities.  RIGHT VENTRICLE: Normal  LEFT ATRIUM: Dilated  RIGHT ATRIUM: Normal  AORTIC VALVE:  Trileaflet.   MITRAL VALVE:  Normal. Mild MR.  TRICUSPID VALVE: Normal Trivial TR.  PULMONIC VALVE: Normal Trivial PR  INTERATRIAL SEPTUM: Normal. No PFO. Bubble study negative  PERICARDIUM: Small effusion  DESCENDING AORTA: Minimal plaque   CONCLUSION: No TEE evidence for source of embolism.

## 2011-09-16 NOTE — Interval H&P Note (Signed)
History and Physical Interval Note:  09/16/2011 4:20 PM  Molly Fleming  has presented today for surgery, with the diagnosis of Stroke  The various methods of treatment have been discussed with the patient and family. After consideration of risks, benefits and other options for treatment, the patient has consented to  Procedure(s): TRANSESOPHAGEAL ECHOCARDIOGRAM (TEE) as a surgical intervention .  The patients' history has been reviewed, patient examined, no change in status, stable for surgery.  I have reviewed the patients' chart and labs.  Questions were answered to the patient's satisfaction.     Maleko Greulich

## 2011-09-16 NOTE — Progress Notes (Signed)
Occupational Therapy Evaluation Patient Details Name: Molly Fleming MRN: [id number] DOB: 1936/05/16 Today's Date: 09/16/2011  Problem List:  Patient Active Problem List  Diagnoses  . Osteoporosis  . Thyroid disease  . Depression  . Hyperlipidemia  . Allergic state  . Arthritis  . Cystic disease of breast  . Actinic keratosis  . Preventative health care  . Frequent UTI  . Dizziness    Past Medical History:  Past Medical History  Diagnosis Date  . Osteoporosis     started taking meds at age 61 then just went off recently- due to conflict she has heard  . Hyperlipidemia   . Allergy     seasonal  . Mumps as a child  . Measles   . Whooping cough as a child  . Cystic disease of breast   . Arthritis     right hand supplement  . Depression   . Thyroid disease     s/p thyroidectomy, goiter  . Actinic keratosis 08/04/2011  . Preventative health care 08/04/2011  . Frequent UTI 08/04/2011  . Headache    Past Surgical History:  Past Surgical History  Procedure Date  . Removed part of thyroid 2002  . Breast surgery     breast biopsies, b/l benign  . Abdominal hysterectomy 1982    partial still has ovaries, uterine prolapse and menorraghia    OT Assessment/Plan/Recommendation OT Assessment Clinical Impression Statement: This 75 y.o. female presents to OT s/p CVAs.  Pt. with diplopia, mild nystagmus, dizziness, nausea, decreased gaze stabilization.  Unable to tolerate HOB >40 degrees and only able to do so for ~3-4 mins due to dizziness.  Feel pt. will benefit from OT to maximize safety and independence with BADLs.  Pt. has been instructed on initial gaze stabilization exercises for habituation.  Feel pt. will need CIR, as dizziness is profound and limits all mobility and activities.  See comments under ADL section and vision for details OT Recommendation/Assessment: Patient will need skilled OT in the acute care venue OT Problem List: Decreased strength;Decreased activity  tolerance;Impaired balance (sitting and/or standing);Impaired vision/perception;Decreased knowledge of use of DME or AE Barriers to Discharge: None OT Therapy Diagnosis : Generalized weakness;Disturbance of vision OT Plan OT Frequency: Min 2X/week OT Treatment/Interventions: Self-care/ADL training;Therapeutic exercise;Neuromuscular education;DME and/or AE instruction;Therapeutic activities;Visual/perceptual remediation/compensation;Patient/family education;Balance training OT Recommendation Follow Up Recommendations: Inpatient Rehab Equipment Recommended: Defer to next venue Individuals Consulted Consulted and Agree with Results and Recommendations: Patient;Family member/caregiver OT Goals Acute Rehab OT Goals OT Goal Formulation: With patient Time For Goal Achievement: 2 weeks ADL Goals Pt Will Perform Grooming: with min assist;Unsupported;Sitting, chair ADL Goal: Grooming - Progress: Not met Pt Will Perform Upper Body Bathing: with supervision;Supported;Sitting, chair ADL Goal: Upper Body Bathing - Progress: Not met Pt Will Perform Lower Body Bathing: with min assist;Sit to stand from chair ADL Goal: Lower Body Bathing - Progress: Not met Pt Will Transfer to Toilet: with mod assist;3-in-1;Stand pivot transfer ADL Goal: Toilet Transfer - Progress: Not met Pt Will Perform Toileting - Hygiene: with supervision;Leaning right and/or left on 3-in-1/toilet ADL Goal: Toileting - Hygiene - Progress: Not met Additional ADL Goal #1: Pt/family will be independent with HEP for vision  ADL Goal: Additional Goal #1 - Progress: Not met  OT Evaluation Precautions/Restrictions  Precautions Precautions: Fall (Although pt. has not achieved EOB or OOB activity) Precaution Comments: Pt. with nausea and dizziness with HOB to 40 Restrictions Weight Bearing Restrictions: No Prior Functioning Home Living Lives With: Spouse Receives Help From:  Family Type of Home: House Home Layout: Two level;Able to  live on main level with bedroom/bathroom Home Access: Stairs to enter Entrance Stairs-Rails: None Entrance Stairs-Number of Steps: 2 Bathroom Toilet: Standard Prior Function Level of Independence: Independent with basic ADLs;Independent with homemaking with ambulation;Independent with gait;Independent with transfers Able to Take Stairs?: Yes Driving: Yes Vocation: Retired ADL ADL Eating/Feeding: Simulated;Set up (Has only been able to drink from cup - small sips) Where Assessed - Eating/Feeding: Bed level Grooming: Simulated;Wash/dry hands;Wash/dry face;Teeth care;Set up (Unable to perform with HOB elevated, or OOB) Where Assessed - Grooming:  (supine) Upper Body Bathing: Simulated;Moderate assistance Where Assessed - Upper Body Bathing: Supine, head of bed flat Lower Body Bathing: Simulated;+1 Total assistance Where Assessed - Lower Body Bathing: Rolling right and/or left Upper Body Dressing: Simulated;Maximal assistance Where Assessed - Upper Body Dressing: Supine, head of bed flat Lower Body Dressing: Simulated;+1 Total assistance Where Assessed - Lower Body Dressing: Supine, head of bed flat;Rolling right and/or left Toilet Transfer: Not assessed (Pt. unable) Toileting - Clothing Manipulation: Not assessed Toileting - Hygiene: Simulated;+1 Total assistance Where Assessed - Toileting Hygiene: Rolling right and/or left ADL Comments: Pt.with continued c/o dizziness accompanied by nausea when HOB raised.  Pt has only tolerated minimal elevation of HOB thus far.  Vision tested in supine, bed flat initially.  Pt. demonstrates difficulty sustaining Lt. lateral gaze OS, and dificulty with Rt. superior gaze Rt. eye.  Very mild horizontal nystagmus noted with pursuits to Rt., and horizontal nystagmus (mild) with all movements OS.  Pt. reports the environment "jumps" and shifts when she raises her bed, and that she has intermittent diplopia.  HOB initially raised to 10 degrees with pt's eyes  closed to reduce visual imput.  Obvious Rt. beating horizontal nystagmus noted (eye lids closed), and pt. with c/o dizziness 6-7/10 with onset of nausea, and unable to tolerate eyes closed.  When eyes open, she automatically stabilizes gaze, and nystagmus not as apparent.  Provided pt. with target for gaze stabilization.  HOB slowly, and incrimentally raised  10 degrees.  Able to tolerate HOB to 40 while stabilizing gaze; however, dizziness increased again to 7/10 with onset of nausea and pt. reporting visual fatigue.  HOB returned to zero, and pt. and family instructed to perform this activty 1-2x more today, attempting to increase tolerance for upright posture.  They were agreeable Vision/Perception  Vision - History Baseline Vision: Bifocals Patient Visual Report: Diplopia;Blurring of vision;Eye fatigue/eye pain/headache;Unable to keep objects in focus;Nausea/blurring vision with head movement Vision - Assessment Eye Alignment: Impaired (comment) Vision Assessment: Vision tested Ocular Range of Motion: Restricted on the left;Restricted looking up (See comments under ADL section for details) Alignment/Gaze Preference: Chin down Tracking/Visual Pursuits: Decreased smoothness of horizontal tracking;Decreased smoothness of vertical tracking;Decreased smoothness of eye movement to RIGHT superior field;Other (comment) (Pt. with difficulty mataining Lt. horizontal gaze) Convergence: Impaired (comment) (~12" from nose) Diplopia Assessment: Disappears with one eye closed;Present in near gaze;Present in far gaze Additional Comments: See comments in ADL section for details Perception Perception: Within Functional Limits Praxis Praxis: Intact Cognition Cognition Arousal/Alertness: Awake/alert Overall Cognitive Status: Appears within functional limits for tasks assessed Orientation Level: Oriented X4 Sensation/Coordination Sensation Light Touch: Appears Intact (Pt. denies sensory  changes) Coordination Gross Motor Movements are Fluid and Coordinated: Yes Fine Motor Movements are Fluid and Coordinated: Yes Extremity Assessment RUE Assessment RUE Assessment: Within Functional Limits LUE Assessment LUE Assessment: Within Functional Limits Mobility  Bed Mobility Bed Mobility: No (Pt. unable to tolerate )  Rolling Right:  (Elevated HOB 10 degrees at a time with gaze satbilization. ) Transfers Transfers: No (Pt. unable to tolerate) Exercises   End of Session OT - End of Session Activity Tolerance: Other (comment) (limited by dizziness and nausea) Patient left: in bed;with call bell in reach;with family/visitor present General Behavior During Session: [facility] for tasks performed Cognition: [facility] for tasks performed   Jaleyah Longhi M 09/16/2011, 3:39 PM

## 2011-09-16 NOTE — Consult Note (Signed)
Physical Medicine and Rehabilitation Consult Reason for Consult: Dizziness and nausea Referring Phsyician: Dr. Waymon Amato    HPI: Molly Fleming is an 75 y.o. right handed female with history of headaches, dyslipidemia, chronic diarrhea, admitted 12/18 with severe dizziness, vomiting with abdominal discomfort, and confusion.  MRI/ MRA    of brain revealed anterior left temporal and medial right temporal lobe infarcts, and mild branch vessel irregularity.  2D echo with EF of 60%, no wall abnormality.  Carotid dopplers without ICA stenosis.   Neurology evaluated patient and recommended TEE for full work up..  ST therapy evaluation done yesterday and no dysphagia noted and felt patient at cognitive baseline.  PT/OT evaluation attempted yesterday but patient severely nauseated.  TEE done revealing EF 60-65%, no PFO, no thrombus. Neuro recommends ASA for CVA prophylaxis.   PT/OT eval done yesterday and limited to [facility] upto 40 degrees before nausea and dizziness worsened.  MD, PT, OT recommending CIR.     Review of Systems  Constitutional: Negative for malaise/fatigue.  HENT: Positive for hearing loss (Right since admission.).        Right ear  Eyes: Positive for blurred vision. Negative for double vision.       When looking toward the left patient complains of blurred vision  Respiratory: Negative for cough and shortness of breath.   Cardiovascular: Negative for chest pain and palpitations.  Gastrointestinal: Negative for heartburn and abdominal pain.  Musculoskeletal: Negative.   Neurological: Positive for dizziness and weakness.  All other systems reviewed and are negative.   Past Medical History  Diagnosis Date  . Osteoporosis     started taking meds at age 42 then just went off recently- due to conflict she has heard  . Hyperlipidemia   . Allergy     seasonal  . Mumps as a child  . Measles   . Whooping cough as a child  . Cystic disease of breast   . Arthritis     right hand supplement    . Depression   . Thyroid disease     s/p thyroidectomy, goiter  . Actinic keratosis 08/04/2011  . Preventative health care 08/04/2011  . Frequent UTI 08/04/2011  . Headache    Past Surgical History  Procedure Date  . Removed part of thyroid 2002  . Breast surgery     breast biopsies, b/l benign  . Abdominal hysterectomy 1982    partial still has ovaries, uterine prolapse and menorraghia   Family History  Problem Relation Age of Onset  . Adopted: Yes  . Cancer Mother   . Heart attack Father   . Heart disease Father   . Cancer Brother     colon  . Anxiety disorder Son    Social History:  Married.  Retired Runner, broadcasting/film/video.  Independent and active PTA.  She reports that she has never smoked. She has never used smokeless tobacco. She reports that she does not drink alcohol or use illicit drugs.  Husband retired and can assist past discharge.   Allergies  Allergen Reactions  . Sulfa Antibiotics Other (See Comments)    unknown    Prior to Admission medications   Medication Sig Start Date End Date Taking? Authorizing Provider  atorvastatin (LIPITOR) 10 MG tablet Take 10 mg by mouth daily.     Yes Historical Provider, MD  Calcium Carbonate-Vitamin D (CALCIUM 600 + D PO) Take 1 tablet by mouth 2 (two) times daily.    Yes Historical Provider, MD  cetirizine (ZYRTEC) 10  MG tablet Take 10 mg by mouth daily.     Yes Historical Provider, MD  ciprofloxacin (CIPRO) 500 MG tablet Take 500 mg by mouth 2 (two) times daily as needed. For urinary tract infection   Yes Historical Provider, MD  fluticasone Aleda Grana) 50 MCG/ACT nasal spray 1 spray each nostril daily 08/04/11 08/03/12 Yes Danise Edge, MD  levothyroxine (SYNTHROID, LEVOTHROID) 75 MCG tablet Take 75 mcg by mouth daily.     Yes Historical Provider, MD  Loperamide HCl (IMODIUM PO) Take by mouth as directed.     Yes Historical Provider, MD  PARoxetine (PAXIL) 10 MG tablet Take 10 mg by mouth every morning.     Yes Historical Provider, MD  vitamin  B-12 (CYANOCOBALAMIN) 1000 MCG tablet Take 1,000 mcg by mouth daily.    Yes Historical Provider, MD   Scheduled Medications:    . aspirin  325 mg Oral Daily  . fentaNYL  250 mcg Intravenous Once  . fluticasone  1 spray Each Nare Daily  . levothyroxine  75 mcg Oral QAC breakfast  . loratadine  10 mg Oral Daily  . midazolam  10 mg Intravenous Once  . ondansetron (ZOFRAN) IV  4 mg Intravenous Q6H  . PARoxetine  10 mg Oral Q0700  . simvastatin  20 mg Oral q1800  . sodium chloride  3 mL Intravenous Q12H  . valproate sodium  250 mg Intravenous Q12H  . vitamin B-12  1,000 mcg Oral Daily   PRN MED's: sodium chloride, acetaminophen, benzocaine, senna-docusate, sodium chloride Home: Home Living Lives With: Spouse Receives Help From: Family Type of Home: House Home Layout: Two level;Able to live on main level with bedroom/bathroom Home Access: Stairs to enter Entrance Stairs-Rails: None Entrance Stairs-Number of Steps: 2 Home Adaptive Equipment: None  Functional History: Prior Function Level of Independence: Independent with basic ADLs;Independent with homemaking with ambulation;Independent with gait;Independent with transfers Able to Take Stairs?: Yes Driving: Yes Vocation: Retired Functional Status:  Mobility: Bed Mobility Bed Mobility: Yes Rolling Right:  (Elevated HOB 10 degrees at a time with gaze satbilization. ) Right Sidelying to Sit: 7: Independent Sit to Supine - Right: 7: Independent Transfers Transfers: No Ambulation/Gait Ambulation/Gait: No Stairs: No Wheelchair Mobility Wheelchair Mobility: No  ADL:    Cognition: Cognition Orientation Level: Oriented X4 Cognition Orientation Level: Oriented X4  Blood pressure 152/82, pulse 57, temperature 97.9 F (36.6 C), temperature source Oral, resp. rate 16, height 5\' 2"  (1.575 m), weight 71.215 kg (157 lb), SpO2 95.00%. Physical Exam  Constitutional: She is oriented to person, place, and time. She appears  well-developed and well-nourished.  HENT:  Head: Normocephalic and atraumatic.  Neck: Normal range of motion.  Cardiovascular: Normal rate and regular rhythm.   Pulmonary/Chest: Effort normal and breath sounds normal.  Abdominal: Soft. Bowel sounds are normal.  Musculoskeletal: Normal range of motion.  Neurological: She is alert and oriented to person, place, and time.       Horizontal nystagmus.   Finger to nose slow and measured due to gaze instability and to reduce symptoms.  Moves all four equally.  Follows basic commands without difficulty.  Memory appears to be intact.  Psychiatric: She has a normal mood and affect. Her behavior is normal. Thought content normal.   visual fields are intact the confrontational testing. Extraocular muscles are intact and Her strength is 5 out of 5 in bilateral upper and bilateral lower extremities area Sensation is normal in bilateral upper and bilateral lower extremities to light touch Assessment/Plan: Diagnosis:  Right medial temporal stroke with vestibular dysfunction as well as decreased hearing on the right side. Question whether she may have some vestibular  cochlear nerve involvement. There is some inconsistency with her functional impairment. At times she walks well with her husband per his report and at other times she needs to stop walking with the therapist because of severe dizziness. 1. Does the need for close, 24 hr/day medical supervision in concert with the patient's rehab needs make it unreasonable for this patient to be served in a less intensive setting? Potentially 2. Co-Morbidities requiring supervision/potential complications: Hearing loss, nausea, anxiety 3. Due to bladder management, bowel management, safety, skin/wound care, medication administration, pain management and patient education, does the patient require 24 hr/day rehab nursing? Potentially 4. Does the patient require coordinated care of a physician, rehab nurse, PT (1-2  hrs/day, 5 days/week), OT (1-2 hrs/day, 5 days/week) and SLP (0.5-1 hrs/day, 5 days/week) to address physical and functional deficits in the context of the above medical diagnosis(es)? Potentially Addressing deficits in the following areas: balance, bathing, cognition, dressing, endurance, grooming, locomotion, strength, toileting and transferring 5. Can the patient actively participate in an intensive therapy program of at least 3 hrs of therapy per day at least 5 days per week? Yes 6. The potential for patient to make measurable gains while on inpatient rehab is good 7. Anticipated functional outcomes upon discharge from inpatients are supervision with mobility PT, supervision with ADLs OT, complete cognitive evaluation SLP 8. Estimated rehab length of stay to reach the above functional goals is: 7-10 days 9. Does the patient have adequate social supports to accommodate these discharge functional goals? Yes 10. Anticipated D/C setting: Home 11. Anticipated post D/C treatments: Outpt therapy 12. Overall Rehab/Functional Prognosis: excellent  RECOMMENDATIONS: This patient's condition is appropriate for continued rehabilitative care in the following setting: Needs additional acute care physical and occupational therapy to further determine her baseline. There is considerable fluctuation in her performance Patient has agreed to participate in recommended program. Potentially Note that insurance prior authorization may be required for reimbursement for recommended care.  Comment:   Jacquelynn Cree 09/16/2011

## 2011-09-16 NOTE — Progress Notes (Signed)
  Echocardiogram Echocardiogram Transesophageal has been performed.  Jayko Voorhees, Real Cons 09/16/2011, 6:29 PM

## 2011-09-16 NOTE — Progress Notes (Signed)
Stroke Team Progress Note  SUBJECTIVE  Molly Fleming is a 75 y.o. female who presented to the ED with new onset dizziness causing nausea and intactible vomiting. This dizziness started the night of admission. That morning she was feeling not her usual self. She has not otherwise been sick recently. She was complaining of abdominal discomfort upon presentation and was catheterized in the ED and had partial relief of the pain. She currently is having discomfort in her abdomen but not pain. Still having vertigo and is unable to sit up. Denied SOB, chest pain, leg pain. She lost hearing in her right ear about half-an-hour before she became dizzy yesterday. She also states that she cannot remember the events of yesterday evening quite well until yesterday morning. The family also feel that she seemed forgetful and was this disoriented and confused night of admission. She is feeling better today except for dizziness when she tries to move.    Her husband is at the bedside. Overall she feels her condition is stable. She is still complaining of some vertigo at rest but her headache and nausea are improved some today.   OBJECTIVE Most recent Vital Signs: Temp: 98.4 F (36.9 C) (12/20 0529) Temp src: Oral (12/20 0529) BP: 130/61 mmHg (12/20 0529) Pulse Rate: 61  (12/20 0529) Respiratory Rate: 18 O2 Saturdation: 97%  CBG (last 3)  No results found for this basename: GLUCAP:3 in the last 72 hours Intake/Output from previous day: 12/19 0701 - 12/20 0700 In: -  Out: 1300 [Urine:1300]  IV Fluid Intake:      . DISCONTD: sodium chloride 100 mL/hr at 09/15/11 0401   Medications    . aspirin  325 mg Oral Daily  . fentaNYL  250 mcg Intravenous Once  . fluticasone  1 spray Each Nare Daily  . levothyroxine  75 mcg Oral QAC breakfast  . loratadine  10 mg Oral Daily  . midazolam  10 mg Intravenous Once  . ondansetron (ZOFRAN) IV  4 mg Intravenous Q6H  . PARoxetine  10 mg Oral Q0700  . simvastatin   20 mg Oral q1800  . sodium chloride  3 mL Intravenous Q12H  . valproate sodium  750 mg Intravenous Once   Followed by  . valproate sodium  250 mg Intravenous Q12H  . vitamin B-12  1,000 mcg Oral Daily  . DISCONTD: aspirin  300 mg Rectal Daily   Diet:  NPO  Activity:  Up with assistance DVT Prophylaxis:  SCDs   Studies: CBC    Component Value Date/Time   WBC 11.1* 09/14/2011 0850   RBC 4.31 09/14/2011 0850   HGB 12.7 09/14/2011 0850   HCT 38.1 09/14/2011 0850   PLT 217 09/14/2011 0850   MCV 88.4 09/14/2011 0850   MCH 29.5 09/14/2011 0850   MCHC 33.3 09/14/2011 0850   RDW 12.5 09/14/2011 0850   CMP    Component Value Date/Time   NA 140 09/14/2011 0850   K 4.0 09/14/2011 0850   CL 106 09/14/2011 0850   CO2 22 09/14/2011 0850   GLUCOSE 114* 09/14/2011 0850   BUN 15 09/14/2011 0850   CREATININE 0.73 09/14/2011 0850   CALCIUM 8.1* 09/14/2011 0850   PROT 7.2 09/14/2011 0850   ALBUMIN 3.7 09/14/2011 0850   AST 25 09/14/2011 0850   ALT 23 09/14/2011 0850   ALKPHOS 64 09/14/2011 0850   BILITOT 0.3 09/14/2011 0850   GFRNONAA 83* 09/14/2011 0850   GFRAA >90 09/14/2011 0850   Lipid Panel  Component Value Date/Time   CHOL 161 09/15/2011 0237   TRIG 91 09/15/2011 0237   HDL 53 09/15/2011 0237   CHOLHDL 3.0 09/15/2011 0237   VLDL 18 09/15/2011 0237   LDLCALC 90 09/15/2011 0237   HgbA1C  Lab Results  Component Value Date   HGBA1C 5.2 09/15/2011   Urine Drug Screen  No results found for this basename: labopia,  cocainscrnur,  labbenz,  amphetmu,  thcu,  labbarb    Alcohol Level No results found for this basename: eth     Ct Abdomen Pelvis Wo Contrast 1.  Jejunal mesenteric findings which could represent mild mesenteric adenitis/panniculitis. 2.  Otherwise, no acute process in the abdomen or pelvis.    CT of the brain  Mild cerebellar atrophy.  No definite acute intracranial abnormality.    MRI of the brain   Anterior left temporal lobe and medial right  temporal lobe small acute infarcts.  Embolic disease not excluded given the distribution of these infarcts.  Please see above.  Mild small vessel disease type changes.  Moderate paranasal sinus mucosal thickening diffusely.  MRA of the brain  Mild branch vessel irregularity.    2D Echocardiogram  ordered   Carotid Doppler  No internal carotid artery stenosis bilaterally. Vertebrals with antegrade flow bilaterally.   CXR   Trace right pleural fluid or thickening.  Otherwise, no acute finding.  EKG  normal sinus rhythm, right axis deviation.   Physical Exam    Mental Status:  Pt is speaking fluidly, without aphasia. Alert and oriented times 3. Follows 3 step commands. Will not sit up as this causes dizziness.  Cranial Nerves:  II: patient does respond to confrontation bilaterally, pupils right 3 mm, left 3 mm,and intact bilaterally  III,IV,VI: EOM intact except for mild upgaze restriction needed no nystagmus noted. No intranuclear ophthalmoplegia noted   V,VII: facial muscles intact, sensation in the face intact and equal bilaterally  VIII: hearing is decreased in the right ear., subjective vertigo present on head movements.  IX,X: gag reflex present, XI: trapezius strength normal  XII: tongue strength normal no deviation  Motor:  Moves all 4 extremities spontaneously, strength 5/5 bilateral upper and lower extremity, no deficits  Sensory:  Sensation to light touch intact and equal globally  Deep Tendon Reflexes:  2+ throughout  Plantars:  downgoing bilaterally  Cerebellar:  Deferred at this time     ASSESSMENT Ms. Molly Fleming is a 75 y.o. female with bilateral temporal lobe infarcts, very small. Temporal lobe infarcts can be associated with memory loss. Etiology of stroke likely embolic but source unknown. Workup underway. Is getting antiplatelet therapy. She passed the swallow evaluation by SLP.   Stroke risk factors:  hyperlipidemia and recent long car ride  (11hr)  Hospital day # 2  TREATMENT/PLAN Add aspirin 300 rectally every day for secondary stroke prevention. TEE to look for embolic source. If positive for PFO (patent foramen ovale), needs bilateral lower extremity dopplers to rule out DVT as source of stroke. Zofran for nausea. Tylenol supp and depacon IV for headache. Keep NPO for likley TEE today.  - called [facility].Discussed with patient and husband and answered questions. PT has recommended acute skilled needs for rehab, will await OT receommendations as well.   Genella Mech, MD PGY-1 [facility] 09/16/2011 7:53 AM  Dr. Delia Heady, Stroke Center Medical Director, has personally reviewed chart, pertinent data, examined the patient and developed the plan of care.

## 2011-09-16 NOTE — H&P (View-Only) (Signed)
Subjective: Chart reviewed. Patient complains of headache beginning at the back of the neck to the back of her head, photophobia and nausea. She vomited twice yesterday. She indicates that the headache is similar to her migraine headaches which occur frequently but is more severe. Headaches are usually resolved with extra strength Tylenol or an aspirin.  Objective: Blood pressure 145/78, pulse 66, temperature 98.1 F (36.7 C), temperature source Oral, resp. rate 20, height 5\' 2"  (1.575 m), weight 71.215 kg (157 lb), SpO2 98.00%.  Intake/Output Summary (Last 24 hours) at 09/15/11 1028 Last data filed at 09/15/11 0900  Gross per 24 hour  Intake      0 ml  Output    400 ml  Net   -400 ml   General exam: In painful distress and holding her head. Respiratory system: Clear. No increased work of breathing. Cardiovascular system: Telemetry shows sinus rhythm. First and second heart sounds heard, regular. No JVD or murmurs. Gastrointestinal system: Abdomen is nondistended, soft and normal bowel sounds heard. Central system: Alert and oriented. No focal neurological deficits.  Lab Results: Basic Metabolic Panel:  Basename 09/14/11 0850 09/14/11 0202 09/14/11 0138  NA 140 142 --  K 4.0 3.1* --  CL 106 108 --  CO2 22 -- 21  GLUCOSE 114* 157* --  BUN 15 22 --  CREATININE 0.73 0.90 --  CALCIUM 8.1* -- 9.3  MG 1.7 -- --  PHOS -- -- --   Liver Function Tests:  [facility] 09/14/11 0850  AST 25  ALT 23  ALKPHOS 64  BILITOT 0.3  PROT 7.2  ALBUMIN 3.7    Basename 09/14/11 0850  LIPASE 21  AMYLASE --   No results found for this basename: AMMONIA:2 in the last 72 hours CBC:  Basename 09/14/11 0850 09/14/11 0202 09/14/11 0138  WBC 11.1* -- 10.0  NEUTROABS -- -- --  HGB 12.7 13.6 --  HCT 38.1 40.0 --  MCV 88.4 -- 87.0  PLT 217 -- 199   Cardiac Enzymes:  Basename 09/15/11 0235 09/14/11 1843 09/14/11 0852  CKTOTAL 69 86 112  CKMB 1.8 2.3 2.8  CKMBINDEX -- -- --  TROPONINI <0.30  <0.30 <0.30   Fasting Lipid Panel:  Basename 09/15/11 0237  CHOL 161  HDL 53  LDLCALC 90  TRIG 91  CHOLHDL 3.0  LDLDIRECT --    Studies/Results: Ct Abdomen Pelvis Wo Contrast  09/14/2011  *RADIOLOGY REPORT*  Clinical Data: Nausea vomiting since last night.  Epigastric pain. Prior hysterectomy.  CT ABDOMEN AND PELVIS WITHOUT CONTRAST  Technique:  Multidetector CT imaging of the abdomen and pelvis was performed following the standard protocol without intravenous contrast.  Comparison: None.  Findings: Patchy subsegmental atelectasis at the lung bases bilaterally. Normal heart size without pericardial or pleural effusion.  Bilateral pleural thickening is mild.  Normal uninfused appearance of the liver, spleen, stomach, pancreas, gallbladder, biliary tract, adrenal glands, kidneys. No retroperitoneal or retrocrural adenopathy.  Normal colon, appendix, and terminal ileum.  Small jejunal mesenteric nodes and mild increased density within the mesenteric fat.  Example image 27.  Small bowel is otherwise within normal limits, without ascites, pneumatosis, or free intraperitoneal air.  No pelvic adenopathy.    Normal urinary bladder.  Hysterectomy.  No adnexal mass.  No significant free fluid.  Degenerative disc disease at the lumbosacral junction.  IMPRESSION:  1.  Jejunal mesenteric findings which could represent mild mesenteric adenitis/panniculitis. 2.  Otherwise, no acute process in the abdomen or pelvis.  Original Report Authenticated By:  Consuello Bossier, M.D.   Dg Chest 1 View  09/14/2011  *RADIOLOGY REPORT*  Clinical Data: Weakness and dizziness.  Nausea.  Headache.  CHEST - 1 VIEW  Comparison: None.  Findings: Surgical clips in the left paratracheal region.  Patient rotated minimally left. Normal heart size.  Minimal right costophrenic angle blunting. No pneumothorax.  Mild nonspecific interstitial thickening without acute pulmonary abnormality.  IMPRESSION: Trace right pleural fluid or thickening.   Otherwise, no acute finding.  Original Report Authenticated By: Consuello Bossier, M.D.   Ct Head Wo Contrast  09/14/2011  *RADIOLOGY REPORT*  Clinical Data: Dizziness, nausea, altered mental status.  Right sided hearing loss.  CT HEAD WITHOUT CONTRAST  Technique:  Contiguous axial images were obtained from the base of the skull through the vertex without contrast.  Comparison: None.  Findings: There is no evidence for acute hemorrhage, hydrocephalus, mass lesion, or abnormal extra-axial fluid collection.  No definite CT evidence for acute infarction.  Mild cerebellar volume loss. The visualized paranasal sinuses and mastoid air cells are predominately clear.  IMPRESSION: Mild cerebellar atrophy.  No definite acute intracranial abnormality.  Original Report Authenticated By: Waneta Martins, M.D.   Mr Brain Wo Contrast  09/15/2011  *RADIOLOGY REPORT*  Clinical Data:  Dizziness with nausea.  Hyperlipidemia.  Chronic recurrent urinary tract infection.  MRI HEAD WITHOUT CONTRAST MRA HEAD WITHOUT CONTRAST IMPRESSION: Anterior left temporal lobe and medial right temporal lobe small acute infarcts.  Embolic disease not excluded given the distribution of these infarcts.  Please see above.  Mild small vessel disease type changes.  Moderate paranasal sinus mucosal thickening diffusely.  MRA HEAD  IMPRESSION: Mild branch vessel irregularity.  Please see above.  Original Report Authenticated By: Fuller Canada, M.D.   Medications: Scheduled Meds:   . sodium chloride   Intravenous STAT  . fluticasone  1 spray Each Nare Daily  . levothyroxine  75 mcg Oral QAC breakfast  . loratadine  10 mg Oral Daily  . PARoxetine  10 mg Oral Q0700  . potassium chloride  10 mEq Intravenous Q1 Hr x 3  . simvastatin  20 mg Oral q1800  . vitamin B-12  1,000 mcg Oral Daily   Continuous Infusions:   . sodium chloride 100 mL/hr at 09/15/11 0401   PRN Meds:.ondansetron (ZOFRAN) IV,  senna-docusate  Assessment/Plan: 1. Dizziness, hearing deficit and transient confusion on admission: MRI of the head shows Anterior left temporal lobe and medial right temporal lobe small acute infarcts. This finding however does not explain her symptoms. Neurology input appreciated. We'll follow their recommendations. Vertigo seems to be better. Carotid Dopplers showed no ICA stenosis. Hemoglobin A1c is in process. Echocardiogram is pending 2. Headache,? Secondary to migraine. Try Tylenol. Neurology starting Depakote. 3. Hypokalemia: Repleted 4. Hyperlipidemia 5. Status post thyroidectomy: Continue Synthroid.   Molly Fleming 09/15/2011, 10:28 AM

## 2011-09-16 NOTE — Plan of Care (Signed)
Problem: Phase II Progression Outcomes Goal: Tolerates increased mobility Outcome: Not Progressing Due to dizziness

## 2011-09-16 NOTE — Progress Notes (Signed)
Physical Therapy Treatment Patient Details Name: Molly Fleming MRN: [id number] DOB: [birth year] Today's Date: 09/16/2011  PT Assessment/Plan  PT - Assessment/Plan Comments on Treatment Session: Co-session with OT.  See OT note for details about occularmotor deficits.  pt noting slight double vision and sensation of room around her shaking/swaying.  Slowly increased HOB by 10degrees with gaze stabilized.  Able to make it to 40degrees before nausea and dizziness becme 6-7/10.  Slight nystagmus noted.  Will continue trying to habituate pt pending medical plan.   PT Plan: Discharge plan needs to be updated;Frequency remains appropriate PT Frequency: Min 4X/week Follow Up Recommendations: Inpatient Rehab Equipment Recommended: Defer to next venue PT Goals  Acute Rehab PT Goals PT Goal: Supine/Side to Sit - Progress: Progressing toward goal PT Goal: Sit to Supine/Side - Progress: Progressing toward goal PT Goal: Sit to Stand - Progress: Not met PT Goal: Ambulate - Progress: Not met PT Goal: Up/Down Stairs - Progress: Not met  PT Treatment Precautions/Restrictions  Precautions Precautions: Fall Precaution Comments: pt very dizzy and nauseated with mobility.   Restrictions Weight Bearing Restrictions: No Mobility (including Balance) Bed Mobility Bed Mobility: Yes Rolling Right:  (Elevated HOB 10 degrees at a time with gaze satbilization. ) Transfers Transfers: No Ambulation/Gait Ambulation/Gait: No Stairs: No Wheelchair Mobility Wheelchair Mobility: No    Exercise    End of Session PT - End of Session Equipment Utilized During Treatment:  (Used "A" for gaze satbilization) Activity Tolerance:  (Limited by nausea and dizziness) Patient left: in bed;with call bell in reach;with family/visitor present General Behavior During Session: [facility] for tasks performed Cognition: [facility] for tasks performed  Molly Fleming, [place] [phone] 09/16/2011, 1:31 PM

## 2011-09-16 NOTE — Progress Notes (Signed)
Attempted to see patient X 2.  Still in procedure.  Will follow up in am to completed consult.

## 2011-09-17 ENCOUNTER — Encounter (HOSPITAL_COMMUNITY): Payer: Self-pay | Admitting: Cardiology

## 2011-09-17 DIAGNOSIS — I6330 Cerebral infarction due to thrombosis of unspecified cerebral artery: Secondary | ICD-10-CM

## 2011-09-17 MED ORDER — MECLIZINE HCL 12.5 MG PO TABS
12.5000 mg | ORAL_TABLET | Freq: Three times a day (TID) | ORAL | Status: DC
  Administered 2011-09-17 – 2011-09-20 (×9): 12.5 mg via ORAL
  Filled 2011-09-17 (×12): qty 1

## 2011-09-17 MED ORDER — DIVALPROEX SODIUM ER 500 MG PO TB24
1000.0000 mg | ORAL_TABLET | Freq: Every day | ORAL | Status: DC
  Administered 2011-09-17 – 2011-09-20 (×4): 1000 mg via ORAL
  Filled 2011-09-17 (×4): qty 2

## 2011-09-17 MED ORDER — STROKE: EARLY STAGES OF RECOVERY BOOK
Freq: Once | Status: AC
  Administered 2011-09-17: 20:00:00
  Filled 2011-09-17: qty 1

## 2011-09-17 MED ORDER — ONDANSETRON HCL 4 MG PO TABS
4.0000 mg | ORAL_TABLET | Freq: Four times a day (QID) | ORAL | Status: DC
  Administered 2011-09-17 – 2011-09-20 (×11): 4 mg via ORAL
  Filled 2011-09-17 (×18): qty 1

## 2011-09-17 MED ORDER — SODIUM CHLORIDE 0.9 % IJ SOLN
3.0000 mL | Freq: Two times a day (BID) | INTRAMUSCULAR | Status: DC
  Administered 2011-09-17 – 2011-09-19 (×3): 3 mL via INTRAVENOUS

## 2011-09-17 NOTE — Progress Notes (Signed)
Physical Therapy Patient Details Name: Molly Fleming MRN: [id number] DOB: 04-Nov-1935 Today's Date: 09/17/2011  Pt just finished with OT and needed time to rest.  Will try back as time allows.    [name], [place] [phone] 09/17/2011, 1:01 PM

## 2011-09-17 NOTE — Progress Notes (Signed)
Stroke Team Progress Note  SUBJECTIVE  Molly Fleming is a 75 y.o. female who presented to the ED with new onset dizziness causing nausea and intactible vomiting. This dizziness started the night of admission. That morning she was feeling not her usual self. She has not otherwise been sick recently. She was complaining of abdominal discomfort upon presentation and was catheterized in the ED and had partial relief of the pain. She currently is having discomfort in her abdomen but not pain. Still having vertigo and is unable to sit up. Denied SOB, chest pain, leg pain. She lost hearing in her right ear about half-an-hour before she became dizzy yesterday. She also states that she cannot remember the events of yesterday evening quite well until yesterday morning. The family also feel that she seemed forgetful and was this disoriented and confused night of admission. She is feeling better today except for dizziness when she tries to move.    Her husband is at the bedside. Overall she feels her condition is stable. She is still complaining of some vertigo and loss of hearing on the right.  Memory intact since event (no memory of the event). Mild headache. The patient is now taking oral food and fluids. The patient indicates that the dizziness is improving over time. The patient has not ambulated yet. Physical and occupational therapy are working with the patient.  OBJECTIVE Most recent Vital Signs: Temp: 98.3 F (36.8 C) (12/21 0637) Temp src: Oral (12/21 0637) BP: 144/76 mmHg (12/21 0637) Pulse Rate: 59  (12/21 0637) Respiratory Rate: 20 O2 Saturdation: 93%  CBG (last 3)  No results found for this basename: GLUCAP:3 in the last 72 hours Intake/Output from previous day:    IV Fluid Intake:     Medications    . aspirin  325 mg Oral Daily  . fluticasone  1 spray Each Nare Daily  . levothyroxine  75 mcg Oral QAC breakfast  . loratadine  10 mg Oral Daily  . ondansetron (ZOFRAN) IV  4 mg  Intravenous Q6H  . PARoxetine  10 mg Oral Q0700  . simvastatin  20 mg Oral q1800  . valproate sodium  250 mg Intravenous Q12H  . vitamin B-12  1,000 mcg Oral Daily  . DISCONTD: fentaNYL  250 mcg Intravenous Once  . DISCONTD: midazolam  10 mg Intravenous Once  . DISCONTD: sodium chloride  3 mL Intravenous Q12H   Diet:  Cardiac  Activity:  Up with assistance DVT Prophylaxis:  SCDs   Studies: CBC    Component Value Date/Time   WBC 11.1* 09/14/2011 0850   RBC 4.31 09/14/2011 0850   HGB 12.7 09/14/2011 0850   HCT 38.1 09/14/2011 0850   PLT 217 09/14/2011 0850   MCV 88.4 09/14/2011 0850   MCH 29.5 09/14/2011 0850   MCHC 33.3 09/14/2011 0850   RDW 12.5 09/14/2011 0850   CMP    Component Value Date/Time   NA 140 09/14/2011 0850   K 4.0 09/14/2011 0850   CL 106 09/14/2011 0850   CO2 22 09/14/2011 0850   GLUCOSE 114* 09/14/2011 0850   BUN 15 09/14/2011 0850   CREATININE 0.73 09/14/2011 0850   CALCIUM 8.1* 09/14/2011 0850   PROT 7.2 09/14/2011 0850   ALBUMIN 3.7 09/14/2011 0850   AST 25 09/14/2011 0850   ALT 23 09/14/2011 0850   ALKPHOS 64 09/14/2011 0850   BILITOT 0.3 09/14/2011 0850   GFRNONAA 83* 09/14/2011 0850   GFRAA >90 09/14/2011 0850   Lipid Panel  Component Value Date/Time   CHOL 161 09/15/2011 0237   TRIG 91 09/15/2011 0237   HDL 53 09/15/2011 0237   CHOLHDL 3.0 09/15/2011 0237   VLDL 18 09/15/2011 0237   LDLCALC 90 09/15/2011 0237   HgbA1C  Lab Results  Component Value Date   HGBA1C 5.2 09/15/2011   Urine Drug Screen  No results found for this basename: labopia,  cocainscrnur,  labbenz,  amphetmu,  thcu,  labbarb    Alcohol Level No results found for this basename: eth     Ct Abdomen Pelvis Wo Contrast 1.  Jejunal mesenteric findings which could represent mild mesenteric adenitis/panniculitis. 2.  Otherwise, no acute process in the abdomen or pelvis.    CT of the brain  Mild cerebellar atrophy.  No definite acute intracranial abnormality.     MRI of the brain   Anterior left temporal lobe and medial right temporal lobe small acute infarcts.  Embolic disease not excluded given the distribution of these infarcts.  Please see above.  Mild small vessel disease type changes.  Moderate paranasal sinus mucosal thickening diffusely.  MRA of the brain  Mild branch vessel irregularity.    2D Echocardiogram  ordered   Carotid Doppler  No internal carotid artery stenosis bilaterally. Vertebrals with antegrade flow bilaterally.   CXR   Trace right pleural fluid or thickening.  Otherwise, no acute finding.  EKG  normal sinus rhythm, right axis deviation.   TEE no source of embolus  LE dopplers neg for acute DVT  Physical Exam    The patient is alert and cooperative at the time of the examination.  Extraocular movements are full, visual fields are full. Speech is well enunciated, no aphasia or dysarthria is noted. Good facial symmetry is seen.  The patient has good strength of all 4 extremities. There is no drift of the upper extremities.  The patient has good finger-nose-finger and toe to finger bilaterally. Gait was not tested.  Deep tendon reflexes are symmetric.   ASSESSMENT Ms. Molly Fleming is a 75 y.o. female with bilateral temporal lobe infarcts, very small. Temporal lobe infarcts can be associated with memory loss.posterior circulation infarcts not seen on MRI given severe vertigo and loss of hearing on the right. Etiology of stroke likely embolic but source unknown, not found. Workup  Completed. Is getting aspirin therapy. Rehab following.  Headache   new depakote yesterday  Nausea, vertigo  zofran prn  Stroke risk factors:  hyperlipidemia and recent long car ride (11hr)  Hospital day # 3  TREATMENT/PLAN Rehab consult pending. Continue ASA 325 mg daily for secondary stroke prevention. Change Depacon to Depakote ER 1000 mg once a day. Please schedule outpatient telemetry monitoring to assess patient for atrial  fibrillation as source of stroke. May be arranged with patient's cardiologist, or cardiologist of choice. Will sign off. Follow up with Dr. Pearlean Brownie in 2 months.   Rhoderick Moody [facility] 09/17/2011 8:23 AM  Dr. Lesia Sago has personally reviewed chart, pertinent data, examined the patient and developed the plan of care.

## 2011-09-17 NOTE — Progress Notes (Signed)
Patient ID: Molly Fleming, female   DOB: 1935-10-08, 75 y.o.   MRN: [id number] Subjective: Patient seen.Complain of dizziness and nausea but denied any vomiting or tinnitus.Denies any systemic symptoms.  Objective: Blood pressure 144/76, pulse 59, temperature 98.3 F (36.8 C), temperature source Oral, resp. rate 20, height 5\' 2"  (1.575 m), weight 71.215 kg (157 lb), SpO2 93.00%. No intake or output data in the 24 hours ending 09/17/11 1005 General exam: Lying comfortably supine in bed and in no  distress Neck-no jvd.Marland Kitchen Respiratory system: Clear. Cardiovascular system: s1 and s2 Abd-soft,non tender,no organs palpable and bowel sounds present. Ext-no pedal edema Neuro-non focal Skin-no ecchymoses.  Lab Results: Basic Metabolic Panel: No results found for this basename: NA:2,K:2,CL:2,CO2:2,GLUCOSE:2,BUN:2,CREATININE:2,CALCIUM:2,MG:2,PHOS:2 in the last 72 hours Liver Function Tests: No results found for this basename: AST:2,ALT:2,ALKPHOS:2,BILITOT:2,PROT:2,ALBUMIN:2 in the last 72 hours No results found for this basename: LIPASE:2,AMYLASE:2 in the last 72 hours No results found for this basename: AMMONIA:2 in the last 72 hours CBC: No results found for this basename: WBC:2,NEUTROABS:2,HGB:2,HCT:2,MCV:2,PLT:2 in the last 72 hours Cardiac Enzymes:  Basename 09/15/11 0235 09/14/11 1843  CKTOTAL 69 86  CKMB 1.8 2.3  CKMBINDEX -- --  TROPONINI <0.30 <0.30   Fasting Lipid Panel:  Basename 09/15/11 0237  CHOL 161  HDL 53  LDLCALC 90  TRIG 91  CHOLHDL 3.0  LDLDIRECT --    Studies/Results: Ct Abdomen Pelvis Wo Contrast  09/14/2011  *RADIOLOGY REPORT*  Clinical Data: Nausea vomiting since last night.  Epigastric pain. Prior hysterectomy.  CT ABDOMEN AND PELVIS WITHOUT CONTRAST  Technique:  Multidetector CT imaging of the abdomen and pelvis was performed following the standard protocol without intravenous contrast.  Comparison: None.  Findings: Patchy subsegmental atelectasis at the  lung bases bilaterally. Normal heart size without pericardial or pleural effusion.  Bilateral pleural thickening is mild.  Normal uninfused appearance of the liver, spleen, stomach, pancreas, gallbladder, biliary tract, adrenal glands, kidneys. No retroperitoneal or retrocrural adenopathy.  Normal colon, appendix, and terminal ileum.  Small jejunal mesenteric nodes and mild increased density within the mesenteric fat.  Example image 27.  Small bowel is otherwise within normal limits, without ascites, pneumatosis, or free intraperitoneal air.  No pelvic adenopathy.    Normal urinary bladder.  Hysterectomy.  No adnexal mass.  No significant free fluid.  Degenerative disc disease at the lumbosacral junction.  IMPRESSION:  1.  Jejunal mesenteric findings which could represent mild mesenteric adenitis/panniculitis. 2.  Otherwise, no acute process in the abdomen or pelvis.  Original Report Authenticated By: Consuello [place]ier, M.D.   Dg Chest 1 View  09/14/2011  *RADIOLOGY REPORT*  Clinical Data: Weakness and dizziness.  Nausea.  Headache.  CHEST - 1 VIEW  Comparison: None.  Findings: Surgical clips in the left paratracheal region.  Patient rotated minimally left. Normal heart size.  Minimal right costophrenic angle blunting. No pneumothorax.  Mild nonspecific interstitial thickening without acute pulmonary abnormality.  IMPRESSION: Trace right pleural fluid or thickening.  Otherwise, no acute finding.  Original Report Authenticated By: Consuello [place], M.D.   Ct Head Wo Contrast  09/14/2011  *RADIOLOGY REPORT*  Clinical Data: Dizziness, nausea, altered mental status.  Right sided hearing loss.  CT HEAD WITHOUT CONTRAST  Technique:  Contiguous axial images were obtained from the base of the skull through the vertex without contrast.  Comparison: None.  Findings: There is no evidence for acute hemorrhage, hydrocephalus, mass lesion, or abnormal extra-axial fluid collection.  No definite CT evidence for acute infarction.  Mild cerebellar volume loss. The visualized paranasal sinuses and mastoid air cells are predominately clear.  IMPRESSION: Mild cerebellar atrophy.  No definite acute intracranial abnormality.  Original Report Authenticated By: Waneta Martins, M.D.   Mr Brain Wo Contrast  09/15/2011  *RADIOLOGY REPORT*  Clinical Data:  Dizziness with nausea.  Hyperlipidemia.  Chronic recurrent urinary tract infection.  MRI HEAD WITHOUT CONTRAST MRA HEAD WITHOUT CONTRAST IMPRESSION: Anterior left temporal lobe and medial right temporal lobe small acute infarcts.  Embolic disease not excluded given the distribution of these infarcts.  Please see above.  Mild small vessel disease type changes.  Moderate paranasal sinus mucosal thickening diffusely.  MRA HEAD  IMPRESSION: Mild branch vessel irregularity.  Please see above.  Original Report Authenticated By: Fuller Canada, M.D.   Medications: Scheduled Meds:    . aspirin  325 mg Oral Daily  . divalproex  1,000 mg Oral Daily  . fluticasone  1 spray Each Nare Daily  . levothyroxine  75 mcg Oral QAC breakfast  . loratadine  10 mg Oral Daily  . ondansetron (ZOFRAN) IV  4 mg Intravenous Q6H  . PARoxetine  10 mg Oral Q0700  . simvastatin  20 mg Oral q1800  . vitamin B-12  1,000 mcg Oral Daily  . DISCONTD: fentaNYL  250 mcg Intravenous Once  . DISCONTD: midazolam  10 mg Intravenous Once  . DISCONTD: sodium chloride  3 mL Intravenous Q12H  . DISCONTD: valproate sodium  250 mg Intravenous Q12H   Continuous Infusions:  PRN Meds:.acetaminophen, senna-docusate, DISCONTD: sodium chloride, DISCONTD: benzocaine, DISCONTD: fentaNYL, DISCONTD: lidocaine, DISCONTD: midazolam, DISCONTD: sodium chloride  Assessment/Plan: 1. Dizziness, hearing deficit and transient confusion on admission: MRI of the head shows bilateral temporal lobe small acute infarcts.One should also keep in mind menierres disease or eustachian tube dysfunction.will add meclizine to regimen 2. Headache,?  Secondary to migraine. Resolve,will continue Depakote. 3. Hypokalemia: Repleted 4. Hyperlipidemia 5. Status post thyroidectomy: Continue Synthroid. Will continue physical therapy.  Lavinia Mcneely 09/17/2011, 10:05 AM

## 2011-09-17 NOTE — Progress Notes (Signed)
Occupational Therapy Treatment Patient Details Name: Molly Fleming MRN: [id number] DOB: 07/02/1936 Today's Date: 09/17/2011  OT Assessment/Plan OT Assessment/Plan Comments on Treatment Session: Pt. demonstrates improving ability to participate in BADLs, but is still very limited by dizziness and nausea.  Pt. able to move to EOB, don/doff socks, and ambulate to sink with min A.  Pt. unable to ambulate into BR due to dizziness. Diplopia continues to be present with Lt. gaze, and pt. with difficulty sustaining Lt. gaze with mild nystagmus present.  Pt has been instructed in habituation exercises, and occulomotor exercises.  Continue to recommend CIR as activity should tolerance should improve with habituation, and pt. is very motivated, and willing to continue to work with therapist if provided with rest breaks with nausea and dizziness 5-7/10.  Doubt she will be able to tolerate car ride home at this time.  Pt. has not started Meclazine as of this morning's session so status may improve significantly.  Will continue to follow. OT Plan: Discharge plan remains appropriate OT Frequency: Min 2X/week Follow Up Recommendations: Inpatient Rehab Equipment Recommended: Defer to next venue;Tub/shower seat OT Goals ADL Goals ADL Goal: Grooming - Progress: Progressing toward goals ADL Goal: Lower Body Bathing - Progress: Progressing toward goals ADL Goal: Toilet Transfer - Progress: Progressing toward goals ADL Goal: Additional Goal #1 - Progress: Progressing toward goals  OT Treatment Precautions/Restrictions  Precautions Precautions: Fall Precaution Comments: Pt reports she ambulated do BR last night, "lying on the furniture to keep from falling"  Long discussion with pt. re: need to have assist when up, and RN made aware   ADL ADL Eating/Feeding: Simulated;Set up (Pt. and husband report pt. is eating now) Grooming: Simulated;+1 Total assistance Grooming Details (indicate cue type and reason): Pt  unable to perform grooming standing at sink due to dizziness and nausea.  Pt able to perform in sitting with rest breaks Where Assessed - Grooming: Standing at sink Lower Body Dressing: Maximal assistance Lower Body Dressing Details (indicate cue type and reason): Pt able to don/doff socks on EOB only due to dizziness.  Unable to complete remainder of the activity Where Assessed - Lower Body Dressing: Sit to stand from bed Toilet Transfer: +1 Total assistance Toilet Transfer Details (indicate cue type and reason): Pt ambulated to sink in room ~12 feet with min A., but unable to continue to bathroom due to dizziness/nausea, and returned to bed  Toilet Transfer Method: Ambulating ADL Comments: Pt now able to begin to participate in beginning ADL tasks, but is severely limited by dizziness/nausea.  Pt. continues with mild nystagmus with Rt. horizontal gaze, and  Lt. mid, superior and inferior gaze.  Diplopia continues with Lt. gaze, and shadowing present with Rt. superior gaze.  PT was instructed in occulomotor, and gaze stabilization exercises. Mobility  Bed Mobility Bed Mobility: Yes Supine to Sit: 5: Supervision Transfers Transfers: Yes Sit to Stand: 4: Min assist Stand to Sit: 4: Min assist Stand to Sit Details: Pt. with complaint of unsteadiness and dizziness with standing, but if able to stabilize gaze, able to keep dizziness at reasonable level for basic activity for short periods of time Exercises    End of Session OT - End of Session Activity Tolerance: Other (comment) (limited by nausea and dizziness) Patient left: in bed;with call bell in reach;with family/visitor present Nurse Communication: Other (comment);Mobility status for ambulation (pt ambulated without assistance last pm) General Behavior During Session: [facility] for tasks performed Cognition: [facility] for tasks performed  Molly Fleming, Molly Fleming  09/17/2011, 12:53 PM

## 2011-09-18 LAB — CBC
HCT: 38.7 % (ref 36.0–46.0)
Hemoglobin: 13 g/dL (ref 12.0–15.0)
RDW: 12.6 % (ref 11.5–15.5)
WBC: 7.9 10*3/uL (ref 4.0–10.5)

## 2011-09-18 LAB — COMPREHENSIVE METABOLIC PANEL
Albumin: 3.6 g/dL (ref 3.5–5.2)
Alkaline Phosphatase: 55 U/L (ref 39–117)
BUN: 11 mg/dL (ref 6–23)
Chloride: 104 mEq/L (ref 96–112)
Glucose, Bld: 85 mg/dL (ref 70–99)
Potassium: 3.5 mEq/L (ref 3.5–5.1)
Total Bilirubin: 0.3 mg/dL (ref 0.3–1.2)

## 2011-09-18 NOTE — Progress Notes (Signed)
Physical Therapy Treatment Patient Details Name: Molly Fleming MRN: [id number] DOB: 10-Aug-1936 Today's Date: 09/18/2011  PT Assessment/Plan  PT - Assessment/Plan Comments on Treatment Session: Pt very motivated.  Still with dizziness, but no nausea or vomitting today.  Pt continues to progress. PT Plan: Discharge plan remains appropriate;Frequency remains appropriate PT Frequency: Min 4X/week Follow Up Recommendations: Inpatient Rehab Equipment Recommended: Defer to next venue PT Goals  Acute Rehab PT Goals PT Goal Formulation: With patient/family Time For Goal Achievement: 2 weeks PT Goal: Supine/Side to Sit - Progress: Met PT Goal: Sit to Supine/Side - Progress: Met PT Goal: Sit to Stand - Progress: Progressing toward goal PT Goal: Ambulate - Progress: Progressing toward goal  PT Treatment Precautions/Restrictions  Precautions Precautions: Fall Precaution Comments: Still with dizziness, but much improved. Required Braces or Orthoses: No Restrictions Weight Bearing Restrictions: No Pain 0/10 with treatment today. Dizziness 4/10 with treatment/mobility. Mobility (including Balance) Bed Mobility Bed Mobility: Yes Rolling Right: 7: Independent Right Sidelying to Sit: 7: Independent Supine to Sit: 7: Independent Sit to Supine - Right: Not Tested (comment) Transfers Transfers: Yes Sit to Stand: 4: Min assist;With upper extremity assist;From bed (2 trials.) Sit to Stand Details (indicate cue type and reason): Assist secondary to slight dizziness and imbalance.  Cues for safest hand placement. Stand to Sit: 4: Min assist;With upper extremity assist;To bed (2 trials.) Stand to Sit Details: Assist to steady pt due to slight imbalance.  Cues for safest hand placement. Ambulation/Gait Ambulation/Gait: Yes Ambulation/Gait Assistance: 3: Mod assist (Up to mod assist progressing to min assist.) Ambulation/Gait Assistance Details (indicate cue type and reason): Assist for  balance with cues for "target finding" with ambulation.  Initially ambulated with unilateral HHA with pt requiring up to mod assist due to several LOBs with assist to correct.  Pt transitioned to a RW and progressed to min assist due to decreased balance and LOB x 1 only.  Cues for "target finding" throughout to increase balance/steadiness while moving. Ambulation Distance (Feet): 120 Feet Assistive device: 1 person hand held assist;Rolling walker (Initially with unilateral HHA and transitioned to RW.) Gait Pattern: Decreased step length - right;Decreased step length - left;Shuffle Stairs: No Wheelchair Mobility Wheelchair Mobility: No  Posture/Postural Control Posture/Postural Control: No significant limitations Balance Balance Assessed: No Exercise  Other Exercises Other Exercises: Pt reports performing gaze stabilization x 1 exercises supine in bed with both horizontal and vertical head turns.  Educated pt to continue practicing these exercises throughout the day. End of Session PT - End of Session Equipment Utilized During Treatment: Gait belt Activity Tolerance: Patient tolerated treatment well Patient left: in bed;with call bell in reach;with family/visitor present Nurse Communication: Mobility status for transfers;Mobility status for ambulation General Behavior During Session: [facility] for tasks performed Cognition: [facility] for tasks performed  Cephus Shelling 09/18/2011, 2:15 PM  09/18/2011 Cephus Shelling, PT, DPT [phone]

## 2011-09-18 NOTE — Progress Notes (Signed)
Subjective: Feel much better today. Dizziness is considerably less, and balance has improved.  Objective: Vital signs in last 24 hours: Temp:  [98.3 F (36.8 C)-98.5 F (36.9 C)] 98.3 F (36.8 C) (12/22 1500) Pulse Rate:  [62-68] 66  (12/22 1500) Resp:  [16-18] 18  (12/22 1500) BP: (106-142)/(72-87) 106/72 mmHg (12/22 1500) SpO2:  [95 %-97 %] 96 % (12/22 1500) Weight change:  Last BM Date: 09/17/11  Intake/Output from previous day: 12/21 0701 - 12/22 0700 In: 600 [P.O.:600] Out: -  Total I/O In: 240 [P.O.:240] Out: -    Physical Exam: General: Comfortable, alert, communicative, fully oriented, not short of breath at rest.  HEENT:  No clinical pallor, no jaundice, no conjunctival injection or discharge. Hydration, fair. No dysarthria. NECK:  Supple, JVP not seen, no carotid bruits, no palpable lymphadenopathy, no palpable goiter. CHEST:  Clinically clear to auscultation, no wheezes, no crackles. HEART:  Sounds 1 and 2 heard, normal, regular, no murmurs. ABDOMEN:  Full, soft, non-tender, no palpable organomegaly, no palpable masses, normal bowel sounds. GENITALIA:  Not examined. LOWER EXTREMITIES:  No pitting edema, palpable peripheral pulses. MUSCULOSKELETAL SYSTEM:  Generalized osteoarthritic changes, otherwise, normal. CENTRAL NERVOUS SYSTEM:  Power appears at least 4/5 in UE and LE. Had some difficulty with finger-to-nose testing, slower and more deliberate on rt, normal on left. Heel-shin test was normal.  Lab Results:  Basename 09/18/11 0600  WBC 7.9  HGB 13.0  HCT 38.7  PLT 213    Basename 09/18/11 0600  NA 141  K 3.5  CL 104  CO2 29  GLUCOSE 85  BUN 11  CREATININE 0.92  CALCIUM 8.9   No results found for this or any previous visit (from the past 240 hour(s)).   Studies/Results: No results found.  Medications: Scheduled Meds:   .  stroke: mapping our early stages of recovery book   Does not apply Once  . aspirin  325 mg Oral Daily  . divalproex   1,000 mg Oral Daily  . fluticasone  1 spray Each Nare Daily  . levothyroxine  75 mcg Oral QAC breakfast  . loratadine  10 mg Oral Daily  . meclizine  12.5 mg Oral TID  . ondansetron  4 mg Oral Q6H  . PARoxetine  10 mg Oral Q0700  . simvastatin  20 mg Oral q1800  . sodium chloride  3 mL Intravenous Q12H  . vitamin B-12  1,000 mcg Oral Daily   Continuous Infusions:  PRN Meds:.acetaminophen, senna-docusate  Assessment/Plan:  1. Acute CVAs: This was confirmed by brain MRI which revealed bilateral temporal lobe small acute infarcts. Patient has been seen by Dr Anne Hahn, neurologist, and he has recommended risk factor modification, as well as secondary prophylaxis. TEE of 09/16/11, showed no intracardiac thrombus or shunt. LE venous doppler on the same date, failed to show a DVT. PT/OT continues. 2. Dizziness, hearing deficit and transient confusion on admission: Per neurologist, this is not fully explained by the location of her infarcts. Meclizine was added to treatment on 09/17/11, and already appears to be having good effect. 3. Headache: Query migraine.  Resolved on Depakote. 4. Hypokalemia: Repleted 5. Hyperlipidemia: On statin Target LDL, should be <70. 6. Status post thyroidectomy: On Synthroid.  Comment: Patient is awaiting possible CIR, and was considered a suitable candidate by both Dr Larna Daughters and PT/OT. Evaluated this afternoon by PT/OT, and appears to have dramatically improved functionality, balance and dizziness. Family is hopeful that they may be able to avoid CIR, and get  home in time for holidays, with HHPT/OT. Await updated recommendation, from PT/OT, after re-evaluation.  LOS: 4 days   Kuulei Kleier,CHRISTOPHER 09/18/2011, 4:33 PM

## 2011-09-18 NOTE — Progress Notes (Signed)
Occupational Therapy Treatment Patient Details Name: Molly Fleming MRN: [id number] DOB: 1936-06-03 Today's Date: 09/18/2011  OT Assessment/Plan OT Assessment/Plan Comments on Treatment Session: Pt SIGNIFICANTLY improved with Meclazine.  She is tolerating ADLs and mobility although dizziness and diplopia still present.  Pt. and family eager to discharge home, and feel they can provide level of care needed.  HEP has been issued from OT standpoint.  Recommend HHOT and HHPT, 3-in-1 commode for home.  Son has RW pt can use.  If pt. still in hospital Monday, will follow up then, and update goals.  Spoke with PT who will re-evaluate pt 09/19/11. OT Plan: Discharge plan needs to be updated OT Frequency: Min 2X/week Follow Up Recommendations: Home health OT;24 hour supervision/assistance Equipment Recommended: 3 in 1 bedside comode OT Goals ADL Goals ADL Goal: Grooming - Progress: Met ADL Goal: Upper Body Bathing - Progress: Met ADL Goal: Lower Body Bathing - Progress: Met ADL Goal: Toilet Transfer - Progress: Met ADL Goal: Toileting - Hygiene - Progress: Met ADL Goal: Additional Goal #1 - Progress: Met  OT Treatment Precautions/Restrictions  Precautions Precautions: Fall Precaution Comments: Still with dizziness, but able to stabilize gaze during mobility. Required Braces or Orthoses: No Restrictions Weight Bearing Restrictions: No   ADL ADL Eating/Feeding: Simulated;Independent Grooming: Performed;Wash/dry hands;Brushing hair (min guard assist) Grooming Details (indicate cue type and reason): Pt. able to stand and look in mirror while performing grooming.  Dizziness increased from 5/10 to 7/10 Where Assessed - Grooming: Standing at sink Upper Body Bathing: Simulated;Supervision/safety Upper Body Bathing Details (indicate cue type and reason): Pt. simulated shower in seated position including extending neck while simulating washing hair with supervision.  Pt. with dizziness increase  from 5/10 to 7/10 Where Assessed - Upper Body Bathing: Sitting, bed;Unsupported Lower Body Bathing: Simulated;Other (comment) (min guard assist) Where Assessed - Lower Body Bathing: Sit to stand from chair Upper Body Dressing: Simulated;Set up Where Assessed - Upper Body Dressing: Sitting, chair;Unsupported Lower Body Dressing: Simulated (min guard assist) Where Assessed - Lower Body Dressing: Sit to stand from bed Toilet Transfer: Performed (min guard assist) Toilet Transfer Method: Ambulating Toilet Transfer Equipment: Comfort height toilet;Grab bars Toileting - Clothing Manipulation: Performed;Minimal assistance (min guard assist) Where Assessed - Toileting Clothing Manipulation: Standing Toileting - Hygiene: Performed;Independent Where Assessed - Toileting Hygiene: Sit on 3-in-1 or toilet Tub/Shower Transfer: Simulated;Minimal assistance Tub/Shower Transfer Details (indicate cue type and reason): walk in shower Tub/Shower Transfer Method: Ambulating Tub/Shower Transfer Equipment: Shower seat with back Equipment Used: Rolling walker ADL Comments: Pt. ambulating in hallway with son and husband with min guard assist.  Pt. stabilizing gaze and does not shift eyes or turn head when ambulating.  Pt. reports dizziness 5/10.  Spoke with pt and family and they are eager to discharge home for the holidays, and report that they will have 24 hour assist available.  Discussed transportation in vehicle, and offered suggestions of reclining, blocking peripheral vision, and providing object on ceiling of car on which to stabilize gaze.  Also discussed need for assist with all transfers, mobility, etc., need for [facility], pacing of activities to accommodate fatigue.  Son reports he has a RW pt can use.  Pt. performed x1 excercises for habituation - definite nystagmus noted this date.  Spoke with PT who will re-evaluate pt. tomorrow, and will assess for possible peripheral vestibular dysfunction since pt.  tolerating movement better.  Pt. instructed to continue occulomotor exercises and x1 exercises.  Pt and family agreeable to all recommendations Mobility  Bed Mobility Bed Mobility: Yes Rolling Right: 7: Independent Right Sidelying to Sit: 7: Independent Supine to Sit: 7: Independent Sit to Supine - Right: Not Tested (comment) Transfers Transfers: Yes Sit to Stand:  (min guard assist) Sit to Stand Details (indicate cue type and reason): Assist secondary to slight dizziness and imbalance.  Cues for safest hand placement. Stand to Sit: Other (comment) (min guard assist) Stand to Sit Details: Assist to steady pt due to slight imbalance.  Cues for safest hand placement. Exercises Other Exercises Other Exercises: Pt reports performing gaze stabilization x 1 exercises supine in bed with both horizontal and vertical head turns.  Educated pt to continue practicing these exercises throughout the day.  End of Session OT - End of Session Activity Tolerance: Patient tolerated treatment well Patient left: in bed;with call bell in reach;with family/visitor present General Behavior During Session: [facility] for tasks performed Cognition: [facility] for tasks performed  Devaun Hernandez, Ursula Alert M  09/18/2011, 5:37 PM

## 2011-09-19 LAB — BASIC METABOLIC PANEL
Calcium: 8.9 mg/dL (ref 8.4–10.5)
Creatinine, Ser: 0.91 mg/dL (ref 0.50–1.10)
GFR calc non Af Amer: 62 mL/min — ABNORMAL LOW (ref >90)
Glucose, Bld: 88 mg/dL (ref 70–99)
Sodium: 140 mEq/L (ref 135–145)

## 2011-09-19 NOTE — Progress Notes (Signed)
Subjective: Feel much better today. Dizziness improving, and balance has improved. Ambulated with PT with RW today.  Objective: Vital signs in last 24 hours: Temp:  [98 F (36.7 C)-98.7 F (37.1 C)] 98.7 F (37.1 C) (12/23 1400) Pulse Rate:  [61-68] 64  (12/23 1400) Resp:  [18-20] 20  (12/23 1400) BP: (107-122)/(49-72) 122/68 mmHg (12/23 1400) SpO2:  [93 %-96 %] 93 % (12/23 1400) Weight change:  Last BM Date: 09/18/11  Intake/Output from previous day: 12/22 0701 - 12/23 0700 In: 240 [P.O.:240] Out: -  Total I/O In: 480 [P.O.:480] Out: -    Physical Exam: General: Comfortable, alert, communicative, fully oriented, not short of breath at rest.  HEENT:  No clinical pallor, no jaundice, no conjunctival injection or discharge. Hydration, fair. No dysarthria. NECK:  Supple, JVP not seen, no carotid bruits, no palpable lymphadenopathy, no palpable goiter. CHEST:  Clinically clear to auscultation, no wheezes, no crackles. HEART:  Sounds 1 and 2 heard, normal, regular, no murmurs. ABDOMEN:  Full, soft, non-tender, no palpable organomegaly, no palpable masses, normal bowel sounds. GENITALIA:  Not examined. LOWER EXTREMITIES:  No pitting edema, palpable peripheral pulses. MUSCULOSKELETAL SYSTEM:  Generalized osteoarthritic changes, otherwise, normal. CENTRAL NERVOUS SYSTEM:  Power appears at least 4/5 in UE and LE. Had some difficulty with finger-to-nose testing, slower and more deliberate on rt, normal on left. Heel-shin test was normal.  Lab Results:  Basename 09/18/11 0600  WBC 7.9  HGB 13.0  HCT 38.7  PLT 213    Basename 09/19/11 0618 09/18/11 0600  NA 140 141  K 3.9 3.5  CL 103 104  CO2 27 29  GLUCOSE 88 85  BUN 17 11  CREATININE 0.91 0.92  CALCIUM 8.9 8.9   No results found for this or any previous visit (from the past 240 hour(s)).   Studies/Results: No results found.  Medications: Scheduled Meds:    . aspirin  325 mg Oral Daily  . divalproex  1,000 mg  Oral Daily  . fluticasone  1 spray Each Nare Daily  . levothyroxine  75 mcg Oral QAC breakfast  . loratadine  10 mg Oral Daily  . meclizine  12.5 mg Oral TID  . ondansetron  4 mg Oral Q6H  . PARoxetine  10 mg Oral Q0700  . simvastatin  20 mg Oral q1800  . sodium chloride  3 mL Intravenous Q12H  . vitamin B-12  1,000 mcg Oral Daily   Continuous Infusions:  PRN Meds:.acetaminophen, senna-docusate  Assessment/Plan:  1. Acute CVAs: This was confirmed by brain MRI which revealed bilateral temporal lobe small acute infarcts. Patient has been seen by Dr Anne Hahn, neurologist, and he has recommended risk factor modification, as well as secondary prophylaxis. TEE of 09/16/11, showed no intracardiac thrombus or shunt. LE venous doppler on the same date, failed to show a DVT. PT/OT continues. 2. Dizziness, hearing deficit and transient confusion on admission: Per neurologist, this is not fully explained by the location of her infarcts. Meclizine was added to treatment on 09/17/11. Continue meclizine. 3. Headache: Query migraine.  Resolved on Depakote. 4. Hypokalemia: Repleted 5. Hyperlipidemia: On statin Target LDL, should be <70. 6. Status post thyroidectomy: On Synthroid.  Comment: Patient is awaiting possible CIR, and was considered a suitable candidate by both Dr Larna Daughters and PT/OT. Evaluated this afternoon by PT/OT, and appears to have dramatically improved functionality, balance and dizziness. Family is hopeful that they may be able to avoid CIR, and get home in time for holidays, with HHPT/OT. Family  state able to provide 24/7 care.  LOS: 5 days   Amanpreet Delmont 09/19/2011, 6:05 PM

## 2011-09-19 NOTE — Progress Notes (Signed)
Physical Therapy Treatment Patient Details Name: Molly Fleming MRN: [id number] DOB: Jun 19, 1936 Today's Date: 09/19/2011  PT Assessment/Plan  PT - Assessment/Plan Comments on Treatment Session: Patient continues to improve with decreased dizziness, improved balance, no nausea today.  Patient able to demonstrate exercise program, and understands need to have assist with mobility for safety. Family reports they can provide 24 hour assistance.  Feel patient can return home with HHPT to continue therapy for dizziness. PT Plan: Discharge plan needs to be updated;Frequency remains appropriate PT Frequency: Min 4X/week Follow Up Recommendations: Home health PT;24 hour supervision/assistance Equipment Recommended: Rolling walker with 5" wheels;3 in 1 bedside comode PT Goals  Acute Rehab PT Goals PT Goal: Supine/Side to Sit - Progress: Met PT Goal: Sit to Supine/Side - Progress: Met PT Goal: Sit to Stand - Progress: Progressing toward goal PT Goal: Ambulate - Progress: Progressing toward goal  PT Treatment Precautions/Restrictions  Precautions Precautions: Fall Precaution Comments: Dizziness continues, but improved per patient Required Braces or Orthoses: No Restrictions Weight Bearing Restrictions: No Mobility (including Balance) Bed Mobility Bed Mobility: Yes Rolling Right: 7: Independent Right Sidelying to Sit: 7: Independent Sit to Supine - Right: 7: Independent Transfers Transfers: Yes Sit to Stand: 5: Supervision;With upper extremity assist;From bed Sit to Stand Details (indicate cue type and reason): Supervision for balance/safety due to dizziness.  Patient able to focus on object for stabilization Stand to Sit: 5: Supervision;Without upper extremity assist;To bed Ambulation/Gait Ambulation/Gait: Yes Ambulation/Gait Assistance: 5: Supervision Ambulation/Gait Assistance Details (indicate cue type and reason): Reviewed focusing on object to decrease dizziness, especially during  turns.  No loss of balance today with use of RW. Ambulation Distance (Feet): 180 Feet Assistive device: Rolling walker Gait Pattern: Step-through pattern;Decreased stride length Gait velocity: Slow gait speed.    Exercise  Other Exercises Other Exercises: Patient able to demonstrate occulomotor exercises (continues with difficulty with moving eyes to left), and gaze stabilization x1 exercises (able to complete in unsupported sitting today). End of Session PT - End of Session Equipment Utilized During Treatment: Gait belt Activity Tolerance: Patient tolerated treatment well Patient left: in bed;with call bell in reach;with family/visitor present Nurse Communication: Mobility status for ambulation General Behavior During Session: [facility] for tasks performed Cognition: [facility] for tasks performed  Vena Austria [phone] 09/19/2011, 11:09 AM

## 2011-09-20 DIAGNOSIS — I639 Cerebral infarction, unspecified: Secondary | ICD-10-CM | POA: Diagnosis present

## 2011-09-20 MED ORDER — ASPIRIN 325 MG PO TABS: 325.0000 mg | ORAL_TABLET | Freq: Every day | ORAL | Status: DC

## 2011-09-20 MED ORDER — MECLIZINE HCL 12.5 MG PO TABS: 25.0000 mg | ORAL_TABLET | Freq: Three times a day (TID) | ORAL | Status: DC

## 2011-09-20 NOTE — Progress Notes (Signed)
Both P.T. And O.T. Are now recommending HH therapy and 24/7 assist of family. Patient has progressed well over the weekend after meclizine initiated. Patient does not need an inpatient acute rehabilitation admit at this time. Please call for any questions. Pager [phone].

## 2011-09-20 NOTE — Progress Notes (Signed)
09/20/2011 Fransico Michael SPARKS Case Management Note [phone]  HOME HEALTH AGENCIES SERVING [facility]   Agencies that are Medicare-Certified and are affiliated with [facility] Health Agency  Telephone Number Address  [facility]   [facility] has ownership interest in this company; however, you are under no obligation to use this agency. [phone] or  [phone] [address] [place], [place] [address]   Agencies that are Medicare-Certified and are not affiliated with [facility] Telephone Number Address  [facility] [phone] Fax [phone] [address] [place], [place]  [address]  [facility] [phone] or [phone] Fax [phone] [address] [place], [place] [address]  [facility] [phone] Fax [phone] [address] [place], [place] [address]  [facility] [phone] Fax [phone] [address] [place], [place]  [address]  [facility] [phone] Fax [phone] [address] [place], [place] [address]  [facility] [phone] [address] [place], [place] [address]  [facility] [phone]  [address] [place], [place] [address]  [facility] [phone] or [phone] Fax [phone] [address] [place], [place]  [address]  [facility] [phone] Fax [phone] [address] [place], [place]  [address]  [facility]  [phone] Fax [phone] [address] [place], [place] [address]               Agencies that are not Medicare-Certified and are not affiliated with [facility] Agency Telephone Number Address  [facility] [place], [place] [phone] or [phone] Fax [phone] [address] [place]  [address]  [facility] [phone] Fax [phone] [address] [place], [place]  [address]  [facility]  [phone] Fax [phone] [address] [place], [place] [address]  [facility] [phone] Fax [phone] [address] [place], [place] [address]  [facility] [phone] or [phone] Fax [phone] [address] [place], [place]  [address]  [facility] [phone] or [phone] Fax [phone] [place], [place]  [address]  [facility] [phone] Fax [phone] [address] [place], [place]  [address]  [facility] [phone] [address] [place], [place]  [address]  [facility] [phone] Fax [phone] [address] [place], [place]  [address]  [facility] [phone] Fax [phone] [address] [place], [place]  [address]  [facility] [phone] Fax [phone] [address] [place], [place] [address]  [facility] [phone] Fax [phone] [address] [place], [place]  [address]   In to speak with patient and family regarding need for  home health services and choice of agencies. Patient chose [place] for services. Eunice Blase, RN with [facility] notified of referral.

## 2011-09-20 NOTE — Progress Notes (Signed)
Physical Therapy Treatment Patient Details Name: Molly Fleming MRN: [id number] DOB: 1936/04/08 Today's Date: 09/20/2011  PT Assessment/Plan  PT - Assessment/Plan Comments on Treatment Session: pt much improved today noting no nausea and only mild dizziness.  pt I with x1 exercises and discussed safety with turns.  Reviewed need to minimize visual input while riding in car with use of hoodie or blinds to block out external stimuli.   PT Plan: Discharge plan remains appropriate PT Frequency: Min 4X/week Follow Up Recommendations: Home health PT;24 hour supervision/assistance Equipment Recommended: Rolling walker with 5" wheels;3 in 1 bedside comode PT Goals  Acute Rehab PT Goals PT Goal: Supine/Side to Sit - Progress: Met PT Goal: Sit to Supine/Side - Progress: Met PT Goal: Sit to Stand - Progress: Progressing toward goal PT Goal: Ambulate - Progress: Progressing toward goal PT Goal: Up/Down Stairs - Progress: Not met  PT Treatment Precautions/Restrictions  Precautions Precautions: Fall Precaution Comments: Dizziness continues, but improved per patient Required Braces or Orthoses: No Restrictions Weight Bearing Restrictions: No Mobility (including Balance) Bed Mobility Bed Mobility: Yes Rolling Right: 7: Independent Right Sidelying to Sit: 7: Independent Sit to Supine - Right: 7: Independent Transfers Transfers: Yes Sit to Stand: 5: Supervision;With upper extremity assist;From bed Sit to Stand Details (indicate cue type and reason): Supervision secondary to dizziness only.   Stand to Sit: 5: Supervision;With upper extremity assist;To bed Ambulation/Gait Ambulation/Gait: Yes Ambulation/Gait Assistance: 5: Supervision Ambulation/Gait Assistance Details (indicate cue type and reason): No LOB with RW.  cues to use more than one target during turns and not trying to pivot all the way with one target.   Ambulation Distance (Feet): 180 Feet Assistive device: Rolling walker Gait  Pattern: Step-through pattern;Decreased stride length (Very rigid secondary to work of focusing gaze.  ) Stairs: No Wheelchair Mobility Wheelchair Mobility: No    Exercise  Other Exercises Other Exercises: pt able to demo x1 exercises without cueing.   End of Session PT - End of Session Equipment Utilized During Treatment: Gait belt Activity Tolerance: Patient tolerated treatment well Patient left: in bed;with call bell in reach;with family/visitor present Nurse Communication: Mobility status for ambulation General Behavior During Session: [facility] for tasks performed Cognition: [facility] for tasks performed  [name], [place] [phone] 09/20/2011, 12:05 PM

## 2011-09-20 NOTE — Discharge Summary (Signed)
Discharge Summary  ANALEISE MCCLEERY MR#: [id number]  DOB:1936/06/06  Date of Admission: 09/14/2011 Date of Discharge: 09/20/2011  Patient's PCP: Danise Edge, MD, MD  Attending Physician:THOMPSON,DANIEL  Consults:    #1 neurology: Dr. Pearlean Brownie #2 cardiology: Dr. Gala Romney  Discharge Diagnoses: CVA (cerebral infarction) Present on Admission:  .Dizziness .Thyroid disease .Hyperlipidemia .CVA (cerebral infarction)   Brief Admitting History and Physical 75 year-old female history of hyperlipidemia, hypothyroidism, chronic recurrent diarrhea on Imodium, chronic recurrent UTI was watching television with her husband at around 10 PM she complained of fullness in the right ear and started complaining dizziness. Her husband has to help her to the bedroom and she was feeling intensely dizzy. Then she started throwing up multiple times. She became very confused. At around 12 AM she was brought ER. CT of the head without contrast nausea and the acute. The ER physician Dr. Juleen China tried some diazepam for possible vertigo. This did not help. Patient in addition also was no abdominal discomfort. In and out catheter had 300 cc urine drained. She still complaining of abdominal discomfort around the epigastric area and also the dizziness. She has become more oriented and is able to recognize her husband but still not able to say the date and time and place. Patient will be admitted for further workup. Patient did not complain of any chest pain, cough, did not function of upper or lower extremities, denied any headache. Did not lose consciousness. For the rest of the admission H and P see H and P by Toniann Fail.   Discharge Medications Medication List  As of 09/20/2011  2:45 PM   START taking these medications         aspirin 325 MG tablet   Take 1 tablet (325 mg total) by mouth daily.      meclizine 12.5 MG tablet   Commonly known as: ANTIVERT   Take 2 tablets (25 mg total) by mouth 3 (three) times  daily.         CONTINUE taking these medications         atorvastatin 10 MG tablet   Commonly known as: LIPITOR      CALCIUM 600 + D PO      cetirizine 10 MG tablet   Commonly known as: ZYRTEC      ciprofloxacin 500 MG tablet   Commonly known as: CIPRO      fluticasone 50 MCG/ACT nasal spray   Commonly known as: FLONASE   1 spray each nostril daily      IMODIUM PO      levothyroxine 75 MCG tablet   Commonly known as: SYNTHROID, LEVOTHROID      PARoxetine 10 MG tablet   Commonly known as: PAXIL      vitamin B-12 1000 MCG tablet   Commonly known as: CYANOCOBALAMIN          Where to get your medications    These are the prescriptions that you need to pick up.   You may get these medications from any pharmacy.         aspirin 325 MG tablet   meclizine 12.5 MG tablet           Hospital Course: CVA (cerebral infarction) Patient was admitted with new onset dizziness causing nausea and vomiting. Patient also was complaining of hearing loss in the right ear. It was a concern for acute stroke and a such MRI MRA of the brain was obtained with results as stated above which was consistent with bilateral  temporal lobe infarcts. Stroke workup was undertaken and patient was placed on aspirin to a TEE was obtained to look for embolic source which was negative. Carotid Dopplers which were done were negative for stenosis. Patient was monitored and followed and meclizine added to her regimen. Patient was also seen by PT OT. Patient improved slowly and that clinically such that she did not need a rehabilitation facility. Patient also did have episode of headache which resolved with IV Depakote. Bilateral lower extremity Dopplers were done and was negative for DVT. Patient improved slowly but clinically and monitored. It was recommended per neurology that patient may benefit from outpatient telemetry monitoring to assess for atrial fibrillation and a such this would be deferred to patient's  PCP. Patient will followup with neurology as outpatient in 2 months post discharge patient was discharged home in stable and improved condition. The rest of patient's chronic medical issues remained stable throughout the hospitalization. Is been a pleasure taking care of Ms. Sehgal.  Present on Admission:  .Dizziness .Thyroid disease .Hyperlipidemia .CVA (cerebral infarction)   Day of Discharge BP 120/76  Pulse 68  Temp(Src) 98 F (36.7 C) (Oral)  Resp 18  Ht 5\' 2"  (1.575 m)  Wt 71.215 kg (157 lb)  BMI 28.72 kg/m2  SpO2 96% Subjective: Some improvement with dizziness. Patient states is able to ambulate with a walker. General: Alert, awake, oriented x3, in no acute distress. Heart: Regular rate and rhythm, without murmurs, rubs, gallops. Lungs: Clear to auscultation bilaterally. Abdomen: Soft, nontender, nondistended, positive bowel sounds. Extremities: No clubbing cyanosis or edema with positive pedal pulses. Neuro: Grossly intact, nonfocal.    Results for orders placed during the hospital encounter of 09/14/11 (from the past 48 hour(s))  BASIC METABOLIC PANEL     Status: Abnormal   Collection Time   09/19/11  6:18 AM      Component Value Range Comment   Sodium 140  135 - 145 (mEq/L)    Potassium 3.9  3.5 - 5.1 (mEq/L)    Chloride 103  96 - 112 (mEq/L)    CO2 27  19 - 32 (mEq/L)    Glucose, Bld 88  70 - 99 (mg/dL)    BUN 17  6 - 23 (mg/dL)    Creatinine, Ser 4.09  0.50 - 1.10 (mg/dL)    Calcium 8.9  8.4 - 10.5 (mg/dL)    GFR calc non Af Amer 62 (*) >90 (mL/min)    GFR calc Af Amer 71 (*) >90 (mL/min)     Ct Abdomen Pelvis Wo Contrast  09/14/2011  *RADIOLOGY REPORT*  Clinical Data: Nausea vomiting since last night.  Epigastric pain. Prior hysterectomy.  CT ABDOMEN AND PELVIS WITHOUT CONTRAST  Technique:  Multidetector CT imaging of the abdomen and pelvis was performed following the standard protocol without intravenous contrast.  Comparison: None.  Findings: Patchy  subsegmental atelectasis at the lung bases bilaterally. Normal heart size without pericardial or pleural effusion.  Bilateral pleural thickening is mild.  Normal uninfused appearance of the liver, spleen, stomach, pancreas, gallbladder, biliary tract, adrenal glands, kidneys. No retroperitoneal or retrocrural adenopathy.  Normal colon, appendix, and terminal ileum.  Small jejunal mesenteric nodes and mild increased density within the mesenteric fat.  Example image 27.  Small bowel is otherwise within normal limits, without ascites, pneumatosis, or free intraperitoneal air.  No pelvic adenopathy.    Normal urinary bladder.  Hysterectomy.  No adnexal mass.  No significant free fluid.  Degenerative disc disease at the lumbosacral junction.  IMPRESSION:  1.  Jejunal mesenteric findings which could represent mild mesenteric adenitis/panniculitis. 2.  Otherwise, no acute process in the abdomen or pelvis.  Original Report Authenticated By: Consuello Bossier, M.D.   Dg Chest 1 View  09/14/2011  *RADIOLOGY REPORT*  Clinical Data: Weakness and dizziness.  Nausea.  Headache.  CHEST - 1 VIEW  Comparison: None.  Findings: Surgical clips in the left paratracheal region.  Patient rotated minimally left. Normal heart size.  Minimal right costophrenic angle blunting. No pneumothorax.  Mild nonspecific interstitial thickening without acute pulmonary abnormality.  IMPRESSION: Trace right pleural fluid or thickening.  Otherwise, no acute finding.  Original Report Authenticated By: Consuello Bossier, M.D.   Ct Head Wo Contrast  09/14/2011  *RADIOLOGY REPORT*  Clinical Data: Dizziness, nausea, altered mental status.  Right sided hearing loss.  CT HEAD WITHOUT CONTRAST  Technique:  Contiguous axial images were obtained from the base of the skull through the vertex without contrast.  Comparison: None.  Findings: There is no evidence for acute hemorrhage, hydrocephalus, mass lesion, or abnormal extra-axial fluid collection.  No definite  CT evidence for acute infarction.  Mild cerebellar volume loss. The visualized paranasal sinuses and mastoid air cells are predominately clear.  IMPRESSION: Mild cerebellar atrophy.  No definite acute intracranial abnormality.  Original Report Authenticated By: Waneta Martins, M.D.   Mr Brain Wo Contrast  09/15/2011  *RADIOLOGY REPORT*  Clinical Data:  Dizziness with nausea.  Hyperlipidemia.  Chronic recurrent urinary tract infection.  MRI HEAD WITHOUT CONTRAST MRA HEAD WITHOUT CONTRAST  Technique: Multiplanar, multiecho pulse sequences of the brain and surrounding structures were obtained according to standard protocol without intravenous contrast.  Angiographic images of the head were obtained using MRA technique without contrast.  Comparison: 09/14/2011 head CT.  No comparison MR.  MRI HEAD  Findings:  Mild cervical spondylotic changes C4-5 and C5-6.  Mild transverse ligament hypertrophy.  Partially empty sella.  IMPRESSION: Anterior left temporal lobe and medial right temporal lobe small acute infarcts.  Embolic disease not excluded given the distribution of these infarcts.  Please see above.  Mild small vessel disease type changes.  Moderate paranasal sinus mucosal thickening diffusely.  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 Anterior circulation without medium or large size vessel significant stenosis or occlusion.  Minimal irregularity of the basilar artery.  No high-grade stenosis of the vertebral arteries or basilar artery.  Mild branch vessel irregularity.  No aneurysm noted.  IMPRESSION: Mild branch vessel irregularity.  Please see above.  Original Report Authenticated By: Fuller Canada, M.D.   Mr Mra Head/brain Wo Cm  09/15/2011  *RADIOLOGY REPORT*  Clinical Data:  Dizziness with nausea.  Hyperlipidemia.  Chronic recurrent urinary tract infection.  MRI HEAD WITHOUT CONTRAST MRA HEAD WITHOUT CONTRAST  Technique: Multiplanar, multiecho pulse sequences of  the brain and surrounding structures were obtained according to standard protocol without intravenous contrast.  Angiographic images of the head were obtained using MRA technique without contrast.  Comparison: 09/14/2011 head CT.  No comparison MR.  MRI HEAD  Findings:  Mild cervical spondylotic changes C4-5 and C5-6.  Mild transverse ligament hypertrophy.  Partially empty sella.  IMPRESSION: Anterior left temporal lobe and medial right temporal lobe small acute infarcts.  Embolic disease not excluded given the distribution of these infarcts.  Please see above.  Mild small vessel disease type changes.  Moderate paranasal sinus mucosal thickening diffusely.  MRA HEAD  Findings: Aplastic A1 segment of  the left anterior cerebral artery. Anterior circulation without medium or large size vessel significant stenosis or occlusion.  Minimal irregularity of the basilar artery.  No high-grade stenosis of the vertebral arteries or basilar artery.  Mild branch vessel irregularity.  No aneurysm noted.  IMPRESSION: Mild branch vessel irregularity.  Please see above.  Original Report Authenticated By: Fuller Canada, M.D.     Disposition: Home with Home health PT/OT  Diet: Low salt diet  Activity: Increase activity slowly.   Follow-up Appts: Discharge Orders    Future Appointments: Provider: Department: Dept Phone: Center:   10/05/2011 10:00 AM Danise Edge, MD [facility] [phone] None     Future Orders Please Complete By Expires   Diet - low sodium heart healthy      Increase activity slowly      Comments:   Per HOME HEALTH PT/OT. WITH RW.   Discharge instructions      Comments:   Follow up with BLYTH,STACEY, MD, in 1 week Follow up with Dr Pearlean Brownie in 2 months with [facility] in 2 months       TESTS THAT NEED FOLLOW-UP Patient will need to outpatient telemetry monitoring to rule out atrial fibrillation. This will need to be arranged per patient's PCP.  Time spent on discharge, talking  to the patient, and coordinating care: 60 mins.   SignedRamiro Harvest 09/20/2011, 2:45 PM

## 2011-09-20 NOTE — Progress Notes (Signed)
Spoke briefly with pt.  She reports she just returned from ambulating with PT.  She is eager to d/c home today.  All education was completed 09/18/11.  Will defer OT today pending D/C and don't want to fatigue pt.  If D/C does not occur, will follow up on 09/20/11.  Jeani Hawking, OTR/L [phone]

## 2011-09-20 NOTE — Progress Notes (Signed)
09/20/2011 [facility], Bosie Clos SPARKS Case Management Note [phone]    CARE MANAGEMENT NOTE 09/20/2011  Patient:  [facility] M   Account Number:  [id number]  Date Initiated:  09/20/2011  Documentation initiated by:  Fransico Michael  Subjective/Objective Assessment:   admitted on 09/14/11 with dizzyness and weakness     Action/Plan:   Prior to admission, patient lived at home with spouse and was independent with ADLs.   Anticipated DC Date:  09/20/2011   Anticipated DC Plan:  HOME W HOME HEALTH SERVICES      DC Planning Services  CM consult      [facility] Choice  HOME HEALTH   Choice offered to / List presented to:  C-1 Patient   DME arranged  3-N-1      DME agency  [facility]     HH arranged  HH-2 PT      [facility] agency  [facility]   Status of service:  Completed, signed off Medicare Important Message given?   (If response is "NO", the following Medicare IM given date fields will be blank) Date Medicare IM given:   Date Additional Medicare IM given:    Discharge Disposition:  HOME W HOME HEALTH SERVICES  Per UR Regulation:  Reviewed for med. necessity/level of care/duration of stay  Comments:  09/20/11-1233-J.Gala Padovano,RN,BSN  [phone]      Patient admitted on 09/14/11 with c/o dizziness and weakness. Diagnosed with CVA, confirmed by MRI. Prior to admission, patient lived at home with spouse and was independent with ADLs. In to speak with patient and family regarding order for home health services. Choice of agencies offered to patient. Genevieve Norlander chosen for services. Debbie,RN with [place] notified of choice. Patient also in need of 3-n-1. Derrian with [facility] notified of need for DME. No further discharge needs identified.

## 2011-09-22 ENCOUNTER — Telehealth: Payer: Self-pay

## 2011-09-22 NOTE — Telephone Encounter (Signed)
Misty Stanley from [facility] called and stated patient had a stroke and hospital sent them orders to have PTOT8 and social work done? Misty Stanley stated that they will not be able to go to patients till the end of next week.  Per Dr Milinda Cave try to call [facility]- they stated they would not be able to get to patients home until mid January. Dr Milinda Cave stated to try [facility]. I spoke with Elnita Maxwell at [facility] and she stated to have [place] send them the orders and they should be able to go to patients home tomorrow (09-23-11).  I spoke with Misty Stanley from [facility] and she is faxing the orders to [facility] (fax: [phone]). Elnita Maxwell stated she would give [place] a call to let us know for sure if she can go tomorrow.

## 2011-09-24 ENCOUNTER — Other Ambulatory Visit: Payer: Medicare Other

## 2011-09-29 ENCOUNTER — Encounter: Payer: Self-pay | Admitting: Family Medicine

## 2011-09-29 ENCOUNTER — Ambulatory Visit (INDEPENDENT_AMBULATORY_CARE_PROVIDER_SITE_OTHER): Payer: Medicare Other | Admitting: Family Medicine

## 2011-09-29 DIAGNOSIS — H9190 Unspecified hearing loss, unspecified ear: Secondary | ICD-10-CM | POA: Diagnosis not present

## 2011-09-29 DIAGNOSIS — E785 Hyperlipidemia, unspecified: Secondary | ICD-10-CM

## 2011-09-29 DIAGNOSIS — H9191 Unspecified hearing loss, right ear: Secondary | ICD-10-CM

## 2011-09-29 DIAGNOSIS — I6350 Cerebral infarction due to unspecified occlusion or stenosis of unspecified cerebral artery: Secondary | ICD-10-CM

## 2011-09-29 DIAGNOSIS — I639 Cerebral infarction, unspecified: Secondary | ICD-10-CM

## 2011-09-29 NOTE — Assessment & Plan Note (Signed)
Discuss recent bone scan with patient has worse degeneration in the spine versus hip. Encouraged increased activity as tolerated given her current

## 2011-09-29 NOTE — Assessment & Plan Note (Signed)
Dizziness, nausea and HA improving quickly, has a home health evaluation with nursing tomorrow to be followed by PT OT as indicated. She is using a walker is encouraged to continue doing so. She has an appointment scheduled with her neurologist Dr. Pearlean Brownie in 2 months that may change that as needed. She is encouraged to keep taking her daily aspirin. She has no obvious inciting event other than a long car ride/stasisand hyperlipidemia. We will consider hematologic workup if any further events occur or at patient's request she's been left with some disequilibrium, unsteady gait and some hearing loss on the right but no other residual symptoms persist. She is unaware of her birth families medical history except that a brother also had a stroke in his late 81s early 15s without any unknown risk factors ie smoking.

## 2011-09-29 NOTE — Patient Instructions (Signed)
Stroke (Cerebrovascular Accident) A stroke is the sudden death of brain tissue. It is a medical emergency. A stroke can cause permanent loss of brain function. This can cause problems with different parts of your body. A TIA (transient ischemic attack) is different because it does not cause permanent damage. A TIA is a short-lived problem of poor blood flow affecting a part of the nervous system. TIA is also a serious problem because having a TIA greatly increases the chances of having a stroke. When symptoms first develop, you cannot know if the problem might be a stroke or TIA. CAUSES  A stroke is caused by a decrease of oxygen supply to an area of your brain. It is usually the result of a small blood clot or the arteries hardening. A stroke can also be caused by blocked or damaged carotid arteries. Bleeding in the brain can cause, or accompany, a stroke. RISK FACTORS  High blood pressure (hypertension).   High cholesterol.   Diabetes.   Heart disease.   The buildup of fatty deposits in the blood vessels (peripheral artery disease or atherosclerosis).   An abnormal heart rhythm (atrial fibrillation).   Obesity.   Smoking.   Taking oral contraceptives (especially in combination with smoking).   Physical inactivity.   A diet high in fats, salt (sodium), and calories.   Alcohol use.   Use of illegal drugs (especially cocaine and methamphetamine).   Being a female.   Being an Tree surgeon.   Age over 54.   Family history of stroke.   Previous history of blood clots, a "warning stroke" (transient ischemic attack, TIA), or heart attack.   Sickle cell disease.  SYMPTOMS  These symptoms usually develop suddenly (or may be newly present upon awakening from sleep):  Sudden weakness or numbness of the face, arm, or leg, especially on one side of the body.   Sudden confusion.   Trouble speaking (aphasia) or understanding.   Sudden trouble seeing in one or both eyes.    Sudden trouble walking.   Dizziness.   Loss of balance or coordination.   Sudden severe headache with no known cause.  DIAGNOSIS  Your caregiver can often determine the presence or absence of a stroke based on your symptoms, history, and examination. A computerized X-ray scan (CT or CAT scan) of the brain is usually performed to confirm the stroke, to look for causes, and to determine the severity. Other tests may be done to find the cause of the stroke. These tests may include:  An EKG and heart monitoring.   An ultrasound evaluation of the heart (echocardiogram).   An ultrasound evaluation of your carotid arteries.   A computerized magnetic scan (MRI).   A scan of the brain circulation.   Blood oxygen level monitoring.   Blood tests.  PREVENTION  The risk of a stroke can be decreased by appropriately treating high blood pressure, high cholesterol, diabetes, heart disease, and obesity and by quitting smoking, limiting alcohol, and staying physically active. TREATMENT  TIME IS OF THE ESSENCE! It is important to seek treatment within 4 hours of the start of symptoms because you may receive a "clot dissolving" medication that cannot be given after that time. Even if you don't know when your symptoms began, get treatment as soon as possible. After the 4 hour window has passed, treatment may include rest, oxygen, intravenous (IV) fluids, and medicines to thin the blood (to prevent another stroke). Treatment of stroke depends on the duration, severity, and  cause of your symptoms. Medicines and diet may be used to address diabetes, high blood pressure, and other risk factors. Physical, speech, and occupational therapists will assess you and work to improve any functions impaired by the stroke. Measures will be taken to prevent short-term and long-term complications, including infection from breathing foreign material into the lungs (aspiration pneumonia), blood clots in the legs, bedsores,  and falls. Rarely, surgery may be needed to remove large blood clots or to open up blocked arteries. HOME CARE INSTRUCTIONS   Medicines: Aspirin and blood thinners may be used to prevent another stroke. Blood thinners need to be used exactly as instructed. Medicines may also be used to control risk factors for a stroke. Be sure you understand all your medicine instructions.   Diet: Certain diets may be prescribed to address high blood pressure, high cholesterol, diabetes, or obesity.   A low salt (sodium), low saturated fat, low trans fat, low cholesterol diet is recommended to manage high blood pressure.   A low saturated fat, low trans fat, low cholesterol, and high fiber diet may control cholesterol levels.   A controlled carbohydrate, controlled sugar diet is recommended to manage diabetes.   A reduced calorie, low sodium, low saturated fat, low trans fat, low cholesterol diet is recommended to manage obesity.   A diet that includes 5 or more servings of fruits and vegetables a day may reduce the risk of stroke. Foods may need to be a special consistency (soft or pureed), or small bites may need to be taken in order to avoid aspirating or choking.   Maintain a healthy weight.   Stay physically active. It is recommended that you get at least 30 minutes of activity on most or all days.   Do not smoke.   Limit alcohol use. Moderate alcohol use is considered to be:   No more than 2 drinks per day for men.   No more than 1 drink per day for nonpregnant women.   Stop drug abuse.   Home safety: A safe home environment is important to reduce the risk of falls. Your caregiver may arrange for specialists to evaluate your home. Having grab bars in the bedroom and bathroom is often important. Your caregiver may arrange for special equipment to be used at home, such as raised toilets and a seat for the shower.   Physical, occupational, and speech therapy: Ongoing therapy may be needed to  maximize your recovery after a stroke. If you have been advised to use a walker or a cane, use it at all times. Be sure to keep your therapy appointments.   Follow all instructions for follow-up with your caregiver. This is VERY important. This includes any referrals, physical therapy, rehabilitation, and laboratory tests. Proper treatment also prevents another stroke from occurring.  SEEK IMMEDIATE MEDICAL CARE IF:   You have sudden weakness or numbness of the face, arm, or leg, especially on one side of the body.   You have sudden confusion.   You have trouble speaking or understanding.   You have sudden trouble seeing in one or both eyes.   You have sudden trouble walking.   You have dizziness.   You have a loss of balance or coordination.   You have a sudden severe headache with no known cause.   You have a fever.   You are coughing or have difficulty breathing.   You have new chest pain, angina, or an irregular heartbeat.  ANY OF THESE SYMPTOMS MAY REPRESENT  A SERIOUS PROBLEM THAT IS AN EMERGENCY. Do not wait to see if the symptoms will go away. Get medical help right away. Call your local emergency services (911 in [place] DO NOT drive yourself to the hospital. Document Released: 09/13/2005 Document Revised: 05/26/2011 Document Reviewed: 05/03/2011 [facility] Patient Information 2012 [place], [place].  Try Calcium Citrate twice daily vs Calcium Caltrate

## 2011-09-29 NOTE — Assessment & Plan Note (Signed)
Tolerating her Atorvastatin and now after stroke we reiiterated the need for this medication a the current time

## 2011-09-29 NOTE — Assessment & Plan Note (Signed)
Patient has always had trouble with decreased hearing on the left and then with this recent stroke she had profound acute hearing loss on the right. Her first presenting symptom was sudden hearing loss in the right ear followed by vertigo, nausea and headache. The hearing is still profoundly on the right. We will wait until the next visit 3-4 weeks and then refer for audiologic evaluation. She is compensating with what's left of her hearing on the left for now. If she wants evaluation sooner she will notify our office.

## 2011-09-29 NOTE — Progress Notes (Signed)
Patient ID: Molly Fleming, female   DOB: 31-Jan-1936, 76 y.o.   MRN: [id number] Molly Fleming [id number] 01/08/1936 09/29/2011      Progress Note-Follow Up  Subjective  Chief Complaint  Chief Complaint  Patient presents with  . Follow-up    hospital follow up    HPI  This is 76 year old Caucasian female who is here today for hospital followup. Several hours after she finished a long car ride in mid-December and sudden loss of hearing in her right ear, her left ear has always worked less well. She has not noted any great improvement in her right ear. Her stroke started on 09/14/2011. The sudden loss of hearing in the right ear followed by severe nausea and vomiting as well as headache. She was transported to the ER when they come 2 strokes one in the right parietal and one the left parietal lobe. She reports since she's been home she's not had therapy or nursing care due to come tomorrow. Her strength and her ability to ambulate are improving. She still has difficulty if she turns her head quickly however. At present no headache, chest pain or palpitations. At the time of the accident she did not have any calf tenderness or swelling. No fevers, chills or recent illness are noted.  Past Medical History  Diagnosis Date  . Osteoporosis     started taking meds at age 11 then just went off recently- due to conflict she has heard  . Hyperlipidemia   . Allergy     seasonal  . Mumps as a child  . Measles   . Whooping cough as a child  . Cystic disease of breast   . Arthritis     right hand supplement  . Depression   . Thyroid disease     s/p thyroidectomy, goiter  . Actinic keratosis 08/04/2011  . Preventative health care 08/04/2011  . Frequent UTI 08/04/2011  . Headache     Past Surgical History  Procedure Date  . Removed part of thyroid 2002  . Breast surgery     breast biopsies, b/l benign  . Abdominal hysterectomy 1982    partial still has ovaries, uterine prolapse and  menorraghia  . Tee without cardioversion 09/16/2011    Procedure: TRANSESOPHAGEAL ECHOCARDIOGRAM (TEE);  Surgeon: Peter Swaziland, MD;  Location: [facility] ENDOSCOPY;  Service: Cardiovascular;  Laterality: N/A;    Family History  Problem Relation Age of Onset  . Adopted: Yes  . Cancer Mother   . Heart attack Father   . Heart disease Father   . Cancer Brother     colon  . Anxiety disorder Son     History   Social History  . Marital Status: Married    Spouse Name: N/A    Number of Children: N/A  . Years of Education: N/A   Occupational History  . Not on file.   Social History Main Topics  . Smoking status: Never Smoker   . Smokeless tobacco: Never Used  . Alcohol Use: No  . Drug Use: No  . Sexually Active: Yes -- Female partner(s)   Other Topics Concern  . Not on file   Social History Narrative  . No narrative on file    Current Outpatient Prescriptions on File Prior to Visit  Medication Sig Dispense Refill  . aspirin 325 MG tablet Take 1 tablet (325 mg total) by mouth daily.      Marland Kitchen atorvastatin (LIPITOR) 10 MG tablet Take 10 mg by mouth daily.        Marland Kitchen  Calcium Carbonate-Vitamin D (CALCIUM 600 + D PO) Take 1 tablet by mouth 2 (two) times daily.       . cetirizine (ZYRTEC) 10 MG tablet Take 10 mg by mouth daily.        . fluticasone (FLONASE) 50 MCG/ACT nasal spray 1 spray each nostril daily  1 g  2  . levothyroxine (SYNTHROID, LEVOTHROID) 75 MCG tablet Take 75 mcg by mouth daily.        . Loperamide HCl (IMODIUM PO) Take by mouth as directed.        . meclizine (ANTIVERT) 12.5 MG tablet Take 2 tablets (25 mg total) by mouth 3 (three) times daily.  90 tablet  0  . PARoxetine (PAXIL) 10 MG tablet Take 10 mg by mouth every morning.        . vitamin B-12 (CYANOCOBALAMIN) 1000 MCG tablet Take 1,000 mcg by mouth daily.       . ciprofloxacin (CIPRO) 500 MG tablet Take 500 mg by mouth 2 (two) times daily as needed. For urinary tract infection        Allergies  Allergen Reactions    . Sulfa Antibiotics Other (See Comments)    unknown    Review of Systems  Review of Systems  Constitutional: Negative for fever and malaise/fatigue.  HENT: Positive for hearing loss. Negative for congestion.   Eyes: Negative for discharge.  Respiratory: Negative for shortness of breath.   Cardiovascular: Negative for chest pain, palpitations and leg swelling.  Gastrointestinal: Negative for nausea, abdominal pain and diarrhea.  Genitourinary: Negative for dysuria.  Musculoskeletal: Negative for falls.  Skin: Negative for rash.  Neurological: Positive for headaches. Negative for loss of consciousness.  Endo/Heme/Allergies: Negative for polydipsia.  Psychiatric/Behavioral: Negative for depression and suicidal ideas. The patient is not nervous/anxious and does not have insomnia.     Objective  BP 125/72  Pulse 63  Temp(Src) 97.6 F (36.4 C) (Temporal)  Ht 5\' 2"  (1.575 m)  Wt 161 lb 12.8 oz (73.392 kg)  BMI 29.59 kg/m2  SpO2 94%  Physical Exam  Physical Exam  Constitutional: She is oriented to person, place, and time and well-developed, well-nourished, and in no distress. No distress.  HENT:  Head: Normocephalic and atraumatic.  Eyes: Conjunctivae are normal.  Neck: Neck supple. No thyromegaly present.  Cardiovascular: Normal rate, regular rhythm and normal heart sounds.   No murmur heard. Pulmonary/Chest: Effort normal and breath sounds normal. She has no wheezes.  Abdominal: She exhibits no distension and no mass.  Musculoskeletal: She exhibits no edema.  Lymphadenopathy:    She has no cervical adenopathy.  Neurological: She is alert and oriented to person, place, and time.       Unsteady gate walking with a walker  Skin: Skin is warm and dry. No rash noted. She is not diaphoretic.  Psychiatric: Memory, affect and judgment normal.    No results found for this basename: TSH   Lab Results  Component Value Date   WBC 7.9 09/18/2011   HGB 13.0 09/18/2011   HCT  38.7 09/18/2011   MCV 89.0 09/18/2011   PLT 213 09/18/2011   Lab Results  Component Value Date   CREATININE 0.91 09/19/2011   BUN 17 09/19/2011   NA 140 09/19/2011   K 3.9 09/19/2011   CL 103 09/19/2011   CO2 27 09/19/2011   Lab Results  Component Value Date   ALT 17 09/18/2011   AST 16 09/18/2011   ALKPHOS 55 09/18/2011   BILITOT 0.3  09/18/2011   Lab Results  Component Value Date   CHOL 161 09/15/2011   Lab Results  Component Value Date   HDL 53 09/15/2011   Lab Results  Component Value Date   LDLCALC 90 09/15/2011   Lab Results  Component Value Date   TRIG 91 09/15/2011   Lab Results  Component Value Date   CHOLHDL 3.0 09/15/2011     Assessment & plan  CVA (cerebral infarction) Dizziness, nausea and HA improving quickly, has a home health evaluation with nursing tomorrow to be followed by PT OT as indicated. She is using a walker is encouraged to continue doing so. She has an appointment scheduled with her neurologist Dr. Pearlean Brownie in 2 months that may change that as needed. She is encouraged to keep taking her daily aspirin. She has no obvious inciting event other than a long car ride/stasisand hyperlipidemia. We will consider hematologic workup if any further events occur or at patient's request she's been left with some disequilibrium, unsteady gait and some hearing loss on the right but no other residual symptoms persist. She is unaware of her birth families medical history except that a brother also had a stroke in his late 25s early 31s without any unknown risk factors ie smoking.   Hearing loss in right ear Patient has always had trouble with decreased hearing on the left and then with this recent stroke she had profound acute hearing loss on the right. Her first presenting symptom was sudden hearing loss in the right ear followed by vertigo, nausea and headache. The hearing is still profoundly on the right. We will wait until the next visit 3-4 weeks and then  refer for audiologic evaluation. She is compensating with what's left of her hearing on the left for now. If she wants evaluation sooner she will notify our office.  Osteoporosis Discuss recent bone scan with patient has worse degeneration in the spine versus hip. Encouraged increased activity as tolerated given her current  Hyperlipidemia Tolerating her Atorvastatin and now after stroke we reiiterated the need for this medication a the current time

## 2011-09-30 ENCOUNTER — Emergency Department (HOSPITAL_COMMUNITY)
Admission: EM | Admit: 2011-09-30 | Discharge: 2011-10-01 | Disposition: A | Discharge status: Home / Self Care | Payer: Medicare Other | Attending: Emergency Medicine | Admitting: Emergency Medicine

## 2011-09-30 ENCOUNTER — Encounter (HOSPITAL_COMMUNITY): Payer: Self-pay | Admitting: Emergency Medicine

## 2011-09-30 DIAGNOSIS — H5710 Ocular pain, unspecified eye: Secondary | ICD-10-CM | POA: Insufficient documentation

## 2011-09-30 DIAGNOSIS — Z7982 Long term (current) use of aspirin: Secondary | ICD-10-CM | POA: Insufficient documentation

## 2011-09-30 DIAGNOSIS — I69998 Other sequelae following unspecified cerebrovascular disease: Secondary | ICD-10-CM | POA: Diagnosis not present

## 2011-09-30 DIAGNOSIS — M129 Arthropathy, unspecified: Secondary | ICD-10-CM | POA: Diagnosis not present

## 2011-09-30 DIAGNOSIS — M19049 Primary osteoarthritis, unspecified hand: Secondary | ICD-10-CM | POA: Insufficient documentation

## 2011-09-30 DIAGNOSIS — E785 Hyperlipidemia, unspecified: Secondary | ICD-10-CM | POA: Diagnosis not present

## 2011-09-30 DIAGNOSIS — Z79899 Other long term (current) drug therapy: Secondary | ICD-10-CM | POA: Insufficient documentation

## 2011-09-30 DIAGNOSIS — R51 Headache: Secondary | ICD-10-CM | POA: Insufficient documentation

## 2011-09-30 DIAGNOSIS — R5381 Other malaise: Secondary | ICD-10-CM | POA: Diagnosis not present

## 2011-09-30 DIAGNOSIS — N390 Urinary tract infection, site not specified: Secondary | ICD-10-CM | POA: Diagnosis not present

## 2011-09-30 DIAGNOSIS — H11419 Vascular abnormalities of conjunctiva, unspecified eye: Secondary | ICD-10-CM | POA: Insufficient documentation

## 2011-09-30 DIAGNOSIS — R5383 Other fatigue: Secondary | ICD-10-CM | POA: Diagnosis not present

## 2011-09-30 DIAGNOSIS — H5789 Other specified disorders of eye and adnexa: Secondary | ICD-10-CM | POA: Insufficient documentation

## 2011-09-30 DIAGNOSIS — H5712 Ocular pain, left eye: Secondary | ICD-10-CM

## 2011-09-30 DIAGNOSIS — R269 Unspecified abnormalities of gait and mobility: Secondary | ICD-10-CM | POA: Diagnosis not present

## 2011-09-30 DIAGNOSIS — R42 Dizziness and giddiness: Secondary | ICD-10-CM | POA: Diagnosis not present

## 2011-09-30 DIAGNOSIS — M810 Age-related osteoporosis without current pathological fracture: Secondary | ICD-10-CM | POA: Insufficient documentation

## 2011-09-30 NOTE — ED Notes (Signed)
PT. REPORTS PROGRESSING LEFT HEADACHE/ LEFT EYE PAIN ONSET 2 DAYS AGO , DENIES INJURY OR FALL  ,  GENERALIZED WEAKNESS " SHAKY' , SPEECH CLEAR ,  NO FACIAL ASYMMETRY,   EQUAL STRONG GRIPS / AMBULATORY.  ALERT AND ORIENTED.

## 2011-10-01 ENCOUNTER — Emergency Department (HOSPITAL_COMMUNITY): Payer: Medicare Other

## 2011-10-01 DIAGNOSIS — Z7982 Long term (current) use of aspirin: Secondary | ICD-10-CM | POA: Diagnosis not present

## 2011-10-01 DIAGNOSIS — Z79899 Other long term (current) drug therapy: Secondary | ICD-10-CM | POA: Diagnosis not present

## 2011-10-01 DIAGNOSIS — H5710 Ocular pain, unspecified eye: Secondary | ICD-10-CM | POA: Diagnosis not present

## 2011-10-01 DIAGNOSIS — M810 Age-related osteoporosis without current pathological fracture: Secondary | ICD-10-CM | POA: Diagnosis not present

## 2011-10-01 DIAGNOSIS — M19049 Primary osteoarthritis, unspecified hand: Secondary | ICD-10-CM | POA: Diagnosis not present

## 2011-10-01 DIAGNOSIS — R51 Headache: Secondary | ICD-10-CM | POA: Diagnosis not present

## 2011-10-01 DIAGNOSIS — H5789 Other specified disorders of eye and adnexa: Secondary | ICD-10-CM | POA: Diagnosis not present

## 2011-10-01 DIAGNOSIS — H2000 Unspecified acute and subacute iridocyclitis: Secondary | ICD-10-CM | POA: Diagnosis not present

## 2011-10-01 DIAGNOSIS — E785 Hyperlipidemia, unspecified: Secondary | ICD-10-CM | POA: Diagnosis not present

## 2011-10-01 DIAGNOSIS — H11419 Vascular abnormalities of conjunctiva, unspecified eye: Secondary | ICD-10-CM | POA: Diagnosis not present

## 2011-10-01 MED ORDER — ERYTHROMYCIN 5 MG/GM OP OINT
TOPICAL_OINTMENT | Freq: Every day | OPHTHALMIC | Status: DC
  Administered 2011-10-01: 02:00:00 via OPHTHALMIC
  Filled 2011-10-01: qty 1

## 2011-10-01 MED ORDER — FLUORESCEIN SODIUM 1 MG OP STRP
ORAL_STRIP | OPHTHALMIC | Status: AC
  Filled 2011-10-01: qty 1

## 2011-10-01 MED ORDER — PROPARACAINE HCL 0.5 % OP SOLN
OPHTHALMIC | Status: AC
  Filled 2011-10-01: qty 15

## 2011-10-01 MED ORDER — OXYCODONE-ACETAMINOPHEN 5-325 MG PO TABS
1.0000 | ORAL_TABLET | Freq: Once | ORAL | Status: AC
  Administered 2011-10-01: 1 via ORAL
  Filled 2011-10-01: qty 1

## 2011-10-01 NOTE — ED Notes (Signed)
Patient transported to CT 

## 2011-10-01 NOTE — ED Provider Notes (Signed)
History     CSN: [id number]  Arrival date & time 09/30/11  2026   First MD Initiated Contact with Patient 10/01/11 0001      Chief Complaint  Patient presents with  . Headache    (Consider location/radiation/quality/duration/timing/severity/associated sxs/prior treatment) Patient is a 76 y.o. female presenting with eye pain. The history is provided by the patient and the spouse. No language interpreter was used.  Eye Pain This is a new problem. The current episode started 2 days ago. The problem occurs constantly. The problem has not changed since onset.Pertinent negatives include no chest pain, no abdominal pain, no headaches and no shortness of breath. The symptoms are aggravated by nothing. The symptoms are relieved by nothing. She has tried nothing for the symptoms. The treatment provided no relief.  Pain is in the left eye and immediately behind it.  The eye is red and swollen.  Patient cannot say if there was trauma or if it started after walking from the dark into the light.  No weakness no numbness no speech abnormalities.  No gait abnormalities  Past Medical History  Diagnosis Date  . Osteoporosis     started taking meds at age 71 then just went off recently- due to conflict she has heard  . Hyperlipidemia   . Allergy     seasonal  . Mumps as a child  . Measles   . Whooping cough as a child  . Cystic disease of breast   . Arthritis     right hand supplement  . Depression   . Thyroid disease     s/p thyroidectomy, goiter  . Actinic keratosis 08/04/2011  . Preventative health care 08/04/2011  . Frequent UTI 08/04/2011  . Headache     Past Surgical History  Procedure Date  . Removed part of thyroid 2002  . Breast surgery     breast biopsies, b/l benign  . Abdominal hysterectomy 1982    partial still has ovaries, uterine prolapse and menorraghia  . Tee without cardioversion 09/16/2011    Procedure: TRANSESOPHAGEAL ECHOCARDIOGRAM (TEE);  Surgeon: Peter Swaziland, MD;   Location: [facility] ENDOSCOPY;  Service: Cardiovascular;  Laterality: N/A;    Family History  Problem Relation Age of Onset  . Adopted: Yes  . Cancer Mother   . Heart attack Father   . Heart disease Father   . Cancer Brother     colon  . Anxiety disorder Son     History  Substance Use Topics  . Smoking status: Never Smoker   . Smokeless tobacco: Never Used  . Alcohol Use: No    OB History    Grav Para Term Preterm Abortions TAB SAB Ect Mult Living                  Review of Systems  Constitutional: Negative for fever.  HENT: Negative for neck pain.   Eyes: Positive for pain and redness. Negative for photophobia, discharge and visual disturbance.  Respiratory: Negative for shortness of breath.   Cardiovascular: Negative for chest pain.  Gastrointestinal: Negative for abdominal pain and abdominal distention.  Genitourinary: Negative for difficulty urinating.  Musculoskeletal: Negative.   Neurological: Negative for syncope, speech difficulty, weakness, numbness and headaches.  Hematological: Negative.   Psychiatric/Behavioral: Negative.     Allergies  Sulfa antibiotics  Home Medications   Current Outpatient Rx  Name Route Sig Dispense Refill  . ASPIRIN 325 MG PO TABS Oral Take 325 mg by mouth daily.      Marland Kitchen  ATORVASTATIN CALCIUM 10 MG PO TABS Oral Take 10 mg by mouth daily.      Marland Kitchen CALCIUM 600 + D PO Oral Take 1 tablet by mouth 2 (two) times daily.     Marland Kitchen CETIRIZINE HCL 10 MG PO TABS Oral Take 10 mg by mouth daily as needed.     Marland Kitchen FLUTICASONE PROPIONATE 50 MCG/ACT NA SUSP Nasal Place 1 spray into the nose daily. 1 spray each nostril daily     . LEVOTHYROXINE SODIUM 75 MCG PO TABS Oral Take 75 mcg by mouth daily.      Marland Kitchen LOPERAMIDE HCL 2 MG PO TABS Oral Take 1 mg by mouth 2 (two) times daily.      Marland Kitchen MECLIZINE HCL 25 MG PO TABS Oral Take 25 mg by mouth 3 (three) times daily.      Marland Kitchen FISH OIL 1000 MG PO CAPS Oral Take 1 capsule by mouth daily.     Marland Kitchen PAROXETINE HCL 10 MG PO TABS  Oral Take 10 mg by mouth every morning.      Marland Kitchen CALCIUM POLYCARBOPHIL 625 MG PO TABS Oral Take 625 mg by mouth 2 (two) times daily.      Marland Kitchen VITAMIN B-12 1000 MCG PO TABS Oral Take 1,000 mcg by mouth daily.     Marland Kitchen CIPROFLOXACIN HCL 500 MG PO TABS Oral Take 500 mg by mouth 2 (two) times daily as needed. For urinary tract infection      BP 108/49  Pulse 65  Temp(Src) 98.6 F (37 C) (Oral)  Resp 18  SpO2 98%  Physical Exam  Constitutional: She is oriented to person, place, and time. She appears well-developed and well-nourished. No distress.  HENT:  Head: Normocephalic and atraumatic.  Mouth/Throat: Oropharynx is clear and moist.  Eyes: EOM are normal. Pupils are equal, round, and reactive to light. Right eye exhibits no chemosis and no discharge. Left eye exhibits chemosis. Left eye exhibits no discharge. Right conjunctiva is not injected. Right conjunctiva has no hemorrhage. Left conjunctiva is injected. Left conjunctiva has no hemorrhage.       Injected left eye.  Unable to use tonopen pain with movement of the eye.  See visual acuity without correction  Neck: Normal range of motion. Neck supple.  Cardiovascular: Normal rate and regular rhythm.   Pulmonary/Chest: Effort normal and breath sounds normal. No respiratory distress.  Musculoskeletal: Normal range of motion.  Neurological: She is alert and oriented to person, place, and time. She displays normal reflexes. No cranial nerve deficit.  Skin: Skin is warm and dry.  Psychiatric: Thought content normal.    ED Course  Procedures (including critical care time)  Labs Reviewed - No data to display Ct Head Wo Contrast  10/01/2011  *RADIOLOGY REPORT*  Clinical Data: Headache.  CT HEAD WITHOUT CONTRAST  Technique:  Contiguous axial images were obtained from the base of the skull through the vertex without contrast.  Comparison: 09/14/2011.  Findings: No acute intracranial abnormality.  Specifically, no hemorrhage, hydrocephalus, mass lesion,  acute infarction, or significant intracranial injury.  No acute calvarial abnormality. Visualized paranasal sinuses and mastoids clear.  Orbital soft tissues unremarkable.  IMPRESSION: No acute intracranial abnormality.  Original Report Authenticated By: Cyndie Chime, M.D.     1. Left eye pain       MDM  Case d/w Dr. Vonna Kotyk, erythromycin ointment in the affected eye Q 1 hours until seen in office at 7 am.  If retinal vein occlusion outside of surgical window.  Jasmine Awe, MD 10/01/11 [phone]

## 2011-10-04 DIAGNOSIS — I69998 Other sequelae following unspecified cerebrovascular disease: Secondary | ICD-10-CM | POA: Diagnosis not present

## 2011-10-04 DIAGNOSIS — R269 Unspecified abnormalities of gait and mobility: Secondary | ICD-10-CM | POA: Diagnosis not present

## 2011-10-04 DIAGNOSIS — E785 Hyperlipidemia, unspecified: Secondary | ICD-10-CM | POA: Diagnosis not present

## 2011-10-04 DIAGNOSIS — R5381 Other malaise: Secondary | ICD-10-CM | POA: Diagnosis not present

## 2011-10-04 DIAGNOSIS — M129 Arthropathy, unspecified: Secondary | ICD-10-CM | POA: Diagnosis not present

## 2011-10-05 ENCOUNTER — Ambulatory Visit: Payer: Medicare Other | Admitting: Family Medicine

## 2011-10-05 DIAGNOSIS — I69998 Other sequelae following unspecified cerebrovascular disease: Secondary | ICD-10-CM | POA: Diagnosis not present

## 2011-10-05 DIAGNOSIS — R269 Unspecified abnormalities of gait and mobility: Secondary | ICD-10-CM | POA: Diagnosis not present

## 2011-10-05 DIAGNOSIS — E785 Hyperlipidemia, unspecified: Secondary | ICD-10-CM | POA: Diagnosis not present

## 2011-10-05 DIAGNOSIS — M129 Arthropathy, unspecified: Secondary | ICD-10-CM | POA: Diagnosis not present

## 2011-10-05 DIAGNOSIS — R42 Dizziness and giddiness: Secondary | ICD-10-CM | POA: Diagnosis not present

## 2011-10-05 DIAGNOSIS — R5383 Other fatigue: Secondary | ICD-10-CM | POA: Diagnosis not present

## 2011-10-06 DIAGNOSIS — I69998 Other sequelae following unspecified cerebrovascular disease: Secondary | ICD-10-CM | POA: Diagnosis not present

## 2011-10-06 DIAGNOSIS — R269 Unspecified abnormalities of gait and mobility: Secondary | ICD-10-CM | POA: Diagnosis not present

## 2011-10-06 DIAGNOSIS — R42 Dizziness and giddiness: Secondary | ICD-10-CM | POA: Diagnosis not present

## 2011-10-06 DIAGNOSIS — R5383 Other fatigue: Secondary | ICD-10-CM | POA: Diagnosis not present

## 2011-10-06 DIAGNOSIS — M129 Arthropathy, unspecified: Secondary | ICD-10-CM | POA: Diagnosis not present

## 2011-10-06 DIAGNOSIS — E785 Hyperlipidemia, unspecified: Secondary | ICD-10-CM | POA: Diagnosis not present

## 2011-10-07 DIAGNOSIS — I69998 Other sequelae following unspecified cerebrovascular disease: Secondary | ICD-10-CM | POA: Diagnosis not present

## 2011-10-07 DIAGNOSIS — R269 Unspecified abnormalities of gait and mobility: Secondary | ICD-10-CM | POA: Diagnosis not present

## 2011-10-07 DIAGNOSIS — R5381 Other malaise: Secondary | ICD-10-CM | POA: Diagnosis not present

## 2011-10-07 DIAGNOSIS — E785 Hyperlipidemia, unspecified: Secondary | ICD-10-CM | POA: Diagnosis not present

## 2011-10-07 DIAGNOSIS — M129 Arthropathy, unspecified: Secondary | ICD-10-CM | POA: Diagnosis not present

## 2011-10-11 DIAGNOSIS — M129 Arthropathy, unspecified: Secondary | ICD-10-CM | POA: Diagnosis not present

## 2011-10-11 DIAGNOSIS — I69998 Other sequelae following unspecified cerebrovascular disease: Secondary | ICD-10-CM | POA: Diagnosis not present

## 2011-10-11 DIAGNOSIS — E785 Hyperlipidemia, unspecified: Secondary | ICD-10-CM | POA: Diagnosis not present

## 2011-10-11 DIAGNOSIS — R5381 Other malaise: Secondary | ICD-10-CM | POA: Diagnosis not present

## 2011-10-11 DIAGNOSIS — R269 Unspecified abnormalities of gait and mobility: Secondary | ICD-10-CM | POA: Diagnosis not present

## 2011-10-13 DIAGNOSIS — I69998 Other sequelae following unspecified cerebrovascular disease: Secondary | ICD-10-CM | POA: Diagnosis not present

## 2011-10-13 DIAGNOSIS — R5381 Other malaise: Secondary | ICD-10-CM | POA: Diagnosis not present

## 2011-10-13 DIAGNOSIS — R269 Unspecified abnormalities of gait and mobility: Secondary | ICD-10-CM | POA: Diagnosis not present

## 2011-10-13 DIAGNOSIS — R42 Dizziness and giddiness: Secondary | ICD-10-CM | POA: Diagnosis not present

## 2011-10-13 DIAGNOSIS — E785 Hyperlipidemia, unspecified: Secondary | ICD-10-CM | POA: Diagnosis not present

## 2011-10-13 DIAGNOSIS — M129 Arthropathy, unspecified: Secondary | ICD-10-CM | POA: Diagnosis not present

## 2011-10-14 DIAGNOSIS — R42 Dizziness and giddiness: Secondary | ICD-10-CM | POA: Diagnosis not present

## 2011-10-14 DIAGNOSIS — I69998 Other sequelae following unspecified cerebrovascular disease: Secondary | ICD-10-CM | POA: Diagnosis not present

## 2011-10-14 DIAGNOSIS — M129 Arthropathy, unspecified: Secondary | ICD-10-CM | POA: Diagnosis not present

## 2011-10-14 DIAGNOSIS — R269 Unspecified abnormalities of gait and mobility: Secondary | ICD-10-CM | POA: Diagnosis not present

## 2011-10-14 DIAGNOSIS — R5381 Other malaise: Secondary | ICD-10-CM | POA: Diagnosis not present

## 2011-10-14 DIAGNOSIS — E785 Hyperlipidemia, unspecified: Secondary | ICD-10-CM | POA: Diagnosis not present

## 2011-10-18 DIAGNOSIS — M129 Arthropathy, unspecified: Secondary | ICD-10-CM | POA: Diagnosis not present

## 2011-10-18 DIAGNOSIS — E785 Hyperlipidemia, unspecified: Secondary | ICD-10-CM | POA: Diagnosis not present

## 2011-10-18 DIAGNOSIS — R269 Unspecified abnormalities of gait and mobility: Secondary | ICD-10-CM | POA: Diagnosis not present

## 2011-10-18 DIAGNOSIS — R42 Dizziness and giddiness: Secondary | ICD-10-CM | POA: Diagnosis not present

## 2011-10-18 DIAGNOSIS — I69998 Other sequelae following unspecified cerebrovascular disease: Secondary | ICD-10-CM | POA: Diagnosis not present

## 2011-10-18 DIAGNOSIS — R5383 Other fatigue: Secondary | ICD-10-CM | POA: Diagnosis not present

## 2011-10-20 ENCOUNTER — Telehealth: Payer: Self-pay

## 2011-10-20 DIAGNOSIS — M129 Arthropathy, unspecified: Secondary | ICD-10-CM | POA: Diagnosis not present

## 2011-10-20 DIAGNOSIS — E785 Hyperlipidemia, unspecified: Secondary | ICD-10-CM | POA: Diagnosis not present

## 2011-10-20 DIAGNOSIS — I69998 Other sequelae following unspecified cerebrovascular disease: Secondary | ICD-10-CM | POA: Diagnosis not present

## 2011-10-20 DIAGNOSIS — R5381 Other malaise: Secondary | ICD-10-CM | POA: Diagnosis not present

## 2011-10-20 DIAGNOSIS — R269 Unspecified abnormalities of gait and mobility: Secondary | ICD-10-CM | POA: Diagnosis not present

## 2011-10-20 NOTE — Telephone Encounter (Signed)
FYIAurther Loft w/ [facility] left a message stating tomorrow (10-21-11) will be his last day working with patient. Aurther Loft stated he is discharging pt a week earlier due to her recovering really fast.

## 2011-10-21 DIAGNOSIS — R42 Dizziness and giddiness: Secondary | ICD-10-CM | POA: Diagnosis not present

## 2011-10-21 DIAGNOSIS — E785 Hyperlipidemia, unspecified: Secondary | ICD-10-CM | POA: Diagnosis not present

## 2011-10-21 DIAGNOSIS — I69998 Other sequelae following unspecified cerebrovascular disease: Secondary | ICD-10-CM | POA: Diagnosis not present

## 2011-10-21 DIAGNOSIS — R269 Unspecified abnormalities of gait and mobility: Secondary | ICD-10-CM | POA: Diagnosis not present

## 2011-10-21 DIAGNOSIS — M129 Arthropathy, unspecified: Secondary | ICD-10-CM | POA: Diagnosis not present

## 2011-10-21 DIAGNOSIS — R5381 Other malaise: Secondary | ICD-10-CM | POA: Diagnosis not present

## 2011-11-02 ENCOUNTER — Ambulatory Visit (INDEPENDENT_AMBULATORY_CARE_PROVIDER_SITE_OTHER): Payer: Medicare Other | Admitting: Family Medicine

## 2011-11-02 ENCOUNTER — Encounter: Payer: Self-pay | Admitting: Family Medicine

## 2011-11-02 DIAGNOSIS — I6350 Cerebral infarction due to unspecified occlusion or stenosis of unspecified cerebral artery: Secondary | ICD-10-CM

## 2011-11-02 DIAGNOSIS — E039 Hypothyroidism, unspecified: Secondary | ICD-10-CM

## 2011-11-02 DIAGNOSIS — H9190 Unspecified hearing loss, unspecified ear: Secondary | ICD-10-CM

## 2011-11-02 DIAGNOSIS — F329 Major depressive disorder, single episode, unspecified: Secondary | ICD-10-CM | POA: Diagnosis not present

## 2011-11-02 DIAGNOSIS — T7840XA Allergy, unspecified, initial encounter: Secondary | ICD-10-CM

## 2011-11-02 DIAGNOSIS — H9191 Unspecified hearing loss, right ear: Secondary | ICD-10-CM

## 2011-11-02 DIAGNOSIS — N390 Urinary tract infection, site not specified: Secondary | ICD-10-CM | POA: Diagnosis not present

## 2011-11-02 DIAGNOSIS — I639 Cerebral infarction, unspecified: Secondary | ICD-10-CM

## 2011-11-02 DIAGNOSIS — E079 Disorder of thyroid, unspecified: Secondary | ICD-10-CM

## 2011-11-02 DIAGNOSIS — E785 Hyperlipidemia, unspecified: Secondary | ICD-10-CM | POA: Diagnosis not present

## 2011-11-02 DIAGNOSIS — H9193 Unspecified hearing loss, bilateral: Secondary | ICD-10-CM

## 2011-11-02 MED ORDER — CIPROFLOXACIN HCL 500 MG PO TABS: 500.0000 mg | ORAL_TABLET | Freq: Two times a day (BID) | ORAL | Status: DC | PRN

## 2011-11-02 MED ORDER — LEVOTHYROXINE SODIUM 75 MCG PO TABS: 75.0000 ug | ORAL_TABLET | Freq: Every day | ORAL | Status: DC

## 2011-11-02 MED ORDER — ATORVASTATIN CALCIUM 10 MG PO TABS: 10.0000 mg | ORAL_TABLET | Freq: Every day | ORAL | Status: DC

## 2011-11-02 MED ORDER — FLUTICASONE PROPIONATE 50 MCG/ACT NA SUSP: 1.0000 | Freq: Every day | NASAL | Status: DC

## 2011-11-02 MED ORDER — PAROXETINE HCL 10 MG PO TABS: 10.0000 mg | ORAL_TABLET | ORAL | Status: DC

## 2011-11-02 NOTE — Assessment & Plan Note (Signed)
Patient doing well on Paroxetine is commited to staying on it for the short term as her brain recovers from her stroke, given refills today

## 2011-11-02 NOTE — Assessment & Plan Note (Signed)
Will refer to Audiology for further evaluation at this time.

## 2011-11-02 NOTE — Assessment & Plan Note (Signed)
Refilled Atorvastatin, will repeat labs in 6 months

## 2011-11-02 NOTE — Patient Instructions (Signed)

## 2011-11-02 NOTE — Assessment & Plan Note (Signed)
Given refill today will recheck labs in 6 months or as needed

## 2011-11-02 NOTE — Assessment & Plan Note (Signed)
Doing very well only minimal disequilibrium no need for Meclizine. Has graduated from PT.

## 2011-11-02 NOTE — Progress Notes (Signed)
Patient ID: Molly Fleming, female   DOB: 1936/09/19, 75 y.o.   MRN: [id number] Molly Fleming [id number] 02-Feb-1936 11/02/2011      Progress Note-Follow Up  Subjective  Chief Complaint  Chief Complaint  Patient presents with  . Follow-up    from stroke    HPI  Patient 76 yo female in today for followup. She's had no recent neurologic events. Her balance is improving daily. No vertiginous episode or need for meclizine. She's had no headaches, chest pain, palpitations, shortness of breath, GI or GU complaints since last seen. She's been taking a probiotic regularly bowels are doing much better. She feels the prosthetist helping her manage her recent stressors well. So far allergies have not flared  Past Medical History  Diagnosis Date  . Osteoporosis     started taking meds at age 74 then just went off recently- due to conflict she has heard  . Hyperlipidemia   . Allergy     seasonal  . Mumps as a child  . Measles   . Whooping cough as a child  . Cystic disease of breast   . Arthritis     right hand supplement  . Depression   . Thyroid disease     s/p thyroidectomy, goiter  . Actinic keratosis 08/04/2011  . Preventative health care 08/04/2011  . Frequent UTI 08/04/2011  . Headache     Past Surgical History  Procedure Date  . Removed part of thyroid 2002  . Breast surgery     breast biopsies, b/l benign  . Abdominal hysterectomy 1982    partial still has ovaries, uterine prolapse and menorraghia  . Tee without cardioversion 09/16/2011    Procedure: TRANSESOPHAGEAL ECHOCARDIOGRAM (TEE);  Surgeon: Peter Swaziland, MD;  Location: [facility] ENDOSCOPY;  Service: Cardiovascular;  Laterality: N/A;    Family History  Problem Relation Age of Onset  . Adopted: Yes  . Cancer Mother   . Heart attack Father   . Heart disease Father   . Cancer Brother     colon  . Anxiety disorder Son     History   Social History  . Marital Status: Married    Spouse Name: N/A    Number of  Children: N/A  . Years of Education: N/A   Occupational History  . Not on file.   Social History Main Topics  . Smoking status: Never Smoker   . Smokeless tobacco: Never Used  . Alcohol Use: No  . Drug Use: No  . Sexually Active: Yes -- Female partner(s)   Other Topics Concern  . Not on file   Social History Narrative  . No narrative on file    Current Outpatient Prescriptions on File Prior to Visit  Medication Sig Dispense Refill  . aspirin 325 MG tablet Take 325 mg by mouth daily.        . Calcium Carbonate-Vitamin D (CALCIUM 600 + D PO) Take 1 tablet by mouth 2 (two) times daily.       . cetirizine (ZYRTEC) 10 MG tablet Take 10 mg by mouth daily as needed.       . Omega-3 Fatty Acids (FISH OIL) 1000 MG CAPS Take 1 capsule by mouth daily.       . polycarbophil (FIBERCON) 625 MG tablet Take 625 mg by mouth 2 (two) times daily.        . vitamin B-12 (CYANOCOBALAMIN) 1000 MCG tablet Take 1,000 mcg by mouth daily.  Allergies  Allergen Reactions  . Sulfa Antibiotics Other (See Comments)    unknown    Review of Systems  Review of Systems  Constitutional: Negative for fever and malaise/fatigue.  HENT: Negative for congestion.   Eyes: Negative for discharge.  Respiratory: Negative for shortness of breath.   Cardiovascular: Negative for chest pain, palpitations and leg swelling.  Gastrointestinal: Negative for nausea, abdominal pain and diarrhea.  Genitourinary: Negative for dysuria.  Musculoskeletal: Negative for falls.  Skin: Negative for rash.  Neurological: Negative for loss of consciousness and headaches.  Endo/Heme/Allergies: Negative for polydipsia.  Psychiatric/Behavioral: Negative for depression and suicidal ideas. The patient is not nervous/anxious and does not have insomnia.     Objective  BP 133/82  Pulse 74  Temp(Src) 98.4 F (36.9 C) (Temporal)  Ht 5\' 2"  (1.575 m)  Wt 163 lb 6.4 oz (74.118 kg)  BMI 29.89 kg/m2  SpO2 93%  Physical  Exam  Physical Exam  Constitutional: She is oriented to person, place, and time and well-developed, well-nourished, and in no distress. No distress.  HENT:  Head: Normocephalic and atraumatic.  Eyes: Conjunctivae are normal.  Neck: Neck supple. No thyromegaly present.  Cardiovascular: Normal rate, regular rhythm and normal heart sounds.   No murmur heard. Pulmonary/Chest: Effort normal and breath sounds normal. She has no wheezes.  Abdominal: She exhibits no distension and no mass.  Musculoskeletal: She exhibits no edema.  Lymphadenopathy:    She has no cervical adenopathy.  Neurological: She is alert and oriented to person, place, and time.  Skin: Skin is warm and dry. No rash noted. She is not diaphoretic.  Psychiatric: Memory, affect and judgment normal.    No results found for this basename: TSH   Lab Results  Component Value Date   WBC 7.9 09/18/2011   HGB 13.0 09/18/2011   HCT 38.7 09/18/2011   MCV 89.0 09/18/2011   PLT 213 09/18/2011   Lab Results  Component Value Date   CREATININE 0.91 09/19/2011   BUN 17 09/19/2011   NA 140 09/19/2011   K 3.9 09/19/2011   CL 103 09/19/2011   CO2 27 09/19/2011   Lab Results  Component Value Date   ALT 17 09/18/2011   AST 16 09/18/2011   ALKPHOS 55 09/18/2011   BILITOT 0.3 09/18/2011   Lab Results  Component Value Date   CHOL 161 09/15/2011   Lab Results  Component Value Date   HDL 53 09/15/2011   Lab Results  Component Value Date   LDLCALC 90 09/15/2011   Lab Results  Component Value Date   TRIG 91 09/15/2011   Lab Results  Component Value Date   CHOLHDL 3.0 09/15/2011     Assessment & Plan  Depression Patient doing well on Paroxetine is commited to staying on it for the short term as her brain recovers from her stroke, given refills today  Thyroid disease Given refill today will recheck labs in 6 months or as needed  Hyperlipidemia Refilled Atorvastatin, will repeat labs in 6 months   CVA  (cerebral infarction) Doing very well only minimal disequilibrium no need for Meclizine. Has graduated from PT.  Hearing loss in right ear Will refer to Audiology for further evaluation at this time.

## 2011-11-10 DIAGNOSIS — H905 Unspecified sensorineural hearing loss: Secondary | ICD-10-CM | POA: Diagnosis not present

## 2011-11-10 DIAGNOSIS — H9120 Sudden idiopathic hearing loss, unspecified ear: Secondary | ICD-10-CM | POA: Diagnosis not present

## 2011-12-06 DIAGNOSIS — I6350 Cerebral infarction due to unspecified occlusion or stenosis of unspecified cerebral artery: Secondary | ICD-10-CM | POA: Diagnosis not present

## 2011-12-21 ENCOUNTER — Encounter (INDEPENDENT_AMBULATORY_CARE_PROVIDER_SITE_OTHER): Payer: Medicare Other

## 2011-12-21 DIAGNOSIS — I4891 Unspecified atrial fibrillation: Secondary | ICD-10-CM | POA: Diagnosis not present

## 2012-03-09 DIAGNOSIS — I6350 Cerebral infarction due to unspecified occlusion or stenosis of unspecified cerebral artery: Secondary | ICD-10-CM | POA: Diagnosis not present

## 2012-05-06 ENCOUNTER — Other Ambulatory Visit: Payer: Self-pay | Admitting: Family Medicine

## 2012-05-08 NOTE — Telephone Encounter (Signed)
Go ahead and give her one refill on each med and let her know she needs an appt for visit and labs prior to further refills

## 2012-05-08 NOTE — Telephone Encounter (Signed)
Please advise refills? Patient is requesting 90 day refills and we haven't seen patient since 11-02-11 for a follow up for stroke and you advised for her to return the first of Aug. No appts are scheduled?

## 2012-05-22 ENCOUNTER — Ambulatory Visit (INDEPENDENT_AMBULATORY_CARE_PROVIDER_SITE_OTHER)
Admission: RE | Admit: 2012-05-22 | Discharge: 2012-05-22 | Disposition: A | Discharge status: Home / Self Care | Payer: Medicare Other | Source: Ambulatory Visit | Attending: Family Medicine | Admitting: Family Medicine

## 2012-05-22 ENCOUNTER — Encounter: Payer: Self-pay | Admitting: Family Medicine

## 2012-05-22 ENCOUNTER — Ambulatory Visit (INDEPENDENT_AMBULATORY_CARE_PROVIDER_SITE_OTHER): Payer: Medicare Other | Admitting: Family Medicine

## 2012-05-22 VITALS — BP 125/80 | HR 60 | Temp 97.5°F | Ht 62.0 in | Wt 129.8 lb

## 2012-05-22 DIAGNOSIS — S93402A Sprain of unspecified ligament of left ankle, initial encounter: Secondary | ICD-10-CM | POA: Insufficient documentation

## 2012-05-22 DIAGNOSIS — S93409A Sprain of unspecified ligament of unspecified ankle, initial encounter: Secondary | ICD-10-CM

## 2012-05-22 DIAGNOSIS — M25579 Pain in unspecified ankle and joints of unspecified foot: Secondary | ICD-10-CM | POA: Diagnosis not present

## 2012-05-22 HISTORY — DX: Sprain of unspecified ligament of left ankle, initial encounter: S93.402A

## 2012-05-22 NOTE — Progress Notes (Signed)
Quick Note:  Patient Informed and voiced understanding ______ 

## 2012-05-22 NOTE — Patient Instructions (Addendum)
Ankle Sprain An ankle sprain is an injury to the strong, fibrous tissues (ligaments) that hold the bones of your ankle joint together.  CAUSES Ankle sprain usually is caused by a fall or by twisting your ankle. People who participate in sports are more prone to these types of injuries.  SYMPTOMS  Symptoms of ankle sprain include:  Pain in your ankle. The pain may be present at rest or only when you are trying to stand or walk.   Swelling.   Bruising. Bruising may develop immediately or within 1 to 2 days after your injury.   Difficulty standing or walking.  DIAGNOSIS  Your caregiver will ask you details about your injury and perform a physical exam of your ankle to determine if you have an ankle sprain. During the physical exam, your caregiver will press and squeeze specific areas of your foot and ankle. Your caregiver will try to move your ankle in certain ways. An X-ray exam may be done to be sure a bone was not broken or a ligament did not separate from one of the bones in your ankle (avulsion).  TREATMENT  Certain types of braces can help stabilize your ankle. Your caregiver can make a recommendation for this. Your caregiver may recommend the use of medication for pain. If your sprain is severe, your caregiver may refer you to a surgeon who helps to restore function to parts of your skeletal system (orthopedist) or a physical therapist. HOME CARE INSTRUCTIONS  Apply ice to your injury for 1 to 2 days or as directed by your caregiver. Applying ice helps to reduce inflammation and pain.  Put ice in a plastic bag.   Place a towel between your skin and the bag.   Leave the ice on for 15 to 20 minutes at a time, every 2 hours while you are awake.   Take over-the-counter or prescription medicines for pain, discomfort, or fever only as directed by your caregiver.   Keep your injured leg elevated, when possible, to lessen swelling.   If your caregiver recommends crutches, use them as  instructed. Gradually, put weight on the affected ankle. Continue to use crutches or a cane until you can walk without feeling pain in your ankle.   If you have a plaster splint, wear the splint as directed by your caregiver. Do not rest it on anything harder than a pillow the first 24 hours. Do not put weight on it. Do not get it wet. You may take it off to take a shower or bath.   You may have been given an elastic bandage to wear around your ankle to provide support. If the elastic bandage is too tight (you have numbness or tingling in your foot or your foot becomes cold and blue), adjust the bandage to make it comfortable.   If you have an air splint, you may blow more air into it or let air out to make it more comfortable. You may take your splint off at night and before taking a shower or bath.   Wiggle your toes in the splint several times per day if you are able.  SEEK MEDICAL CARE IF:   You have an increase in bruising, swelling, or pain.   Your toes feel cold.   Pain relief is not achieved with medication.  SEEK IMMEDIATE MEDICAL CARE IF: Your toes are numb or blue or you have severe pain. MAKE SURE YOU:   Understand these instructions.   Will watch your condition.     Will get help right away if you are not doing well or get worse.  Document Released: 09/13/2005 Document Revised: 09/02/2011 Document Reviewed: 04/17/2008 [facility] Patient Information 2012 [place], [place].  Ice and Aspercreme 3 x daily

## 2012-05-25 ENCOUNTER — Encounter: Payer: Self-pay | Admitting: Family Medicine

## 2012-05-25 NOTE — Progress Notes (Signed)
Patient ID: Molly Fleming, female   DOB: 04/24/1936, 76 y.o.   MRN: [id number] Molly Fleming [id number] April 17, 1936 05/25/2012      Progress Note-Follow Up  Subjective  Chief Complaint  Chief Complaint  Patient presents with  . Foot Injury    Left foot hurts- wants xray- twisted ankle possible X last night    HPI  Patient is a 76 year old female in today for evaluation of an ankle sprain. She was in her house last night when she had a quick slipped and inverted her ankle. She's pain swelling over her lateral alveolus. Pain is worse with walking but she is able to do so. No point tenderness over the foot. No numbness tingling or weakness in the foot. No other injuries reported. She otherwise. No syncope was experienced. No chest pain, palpitations, shortness of breath or recent illness noted.  Past Medical History  Diagnosis Date  . Osteoporosis     started taking meds at age 89 then just went off recently- due to conflict she has heard  . Hyperlipidemia   . Allergy     seasonal  . Mumps as a child  . Measles   . Whooping cough as a child  . Cystic disease of breast   . Arthritis     right hand supplement  . Depression   . Thyroid disease     s/p thyroidectomy, goiter  . Actinic keratosis 08/04/2011  . Preventative health care 08/04/2011  . Frequent UTI 08/04/2011  . Headache   . Left ankle sprain 05/22/2012    Past Surgical History  Procedure Date  . Removed part of thyroid 2002  . Breast surgery     breast biopsies, b/l benign  . Abdominal hysterectomy 1982    partial still has ovaries, uterine prolapse and menorraghia  . Tee without cardioversion 09/16/2011    Procedure: TRANSESOPHAGEAL ECHOCARDIOGRAM (TEE);  Surgeon: Peter Swaziland, MD;  Location: [facility] ENDOSCOPY;  Service: Cardiovascular;  Laterality: N/A;    Family History  Problem Relation Age of Onset  . Adopted: Yes  . Cancer Mother   . Heart attack Father   . Heart disease Father   . Cancer Brother    colon  . Anxiety disorder Son     History   Social History  . Marital Status: Married    Spouse Name: N/A    Number of Children: N/A  . Years of Education: N/A   Occupational History  . Not on file.   Social History Main Topics  . Smoking status: Never Smoker   . Smokeless tobacco: Never Used  . Alcohol Use: No  . Drug Use: No  . Sexually Active: Yes -- Female partner(s)   Other Topics Concern  . Not on file   Social History Narrative  . No narrative on file    Current Outpatient Prescriptions on File Prior to Visit  Medication Sig Dispense Refill  . aspirin 325 MG tablet Take 325 mg by mouth daily.        Marland Kitchen atorvastatin (LIPITOR) 10 MG tablet TAKE 1 TABLET DAILY  90 tablet  0  . Calcium Carbonate-Vitamin D (CALCIUM 600 + D PO) Take 1 tablet by mouth 2 (two) times daily.       . cetirizine (ZYRTEC) 10 MG tablet Take 10 mg by mouth daily as needed.       Marland Kitchen levothyroxine (SYNTHROID, LEVOTHROID) 75 MCG tablet TAKE 1 TABLET DAILY  90 tablet  0  . Omega-3 Fatty Acids (  FISH OIL) 1000 MG CAPS Take 1 capsule by mouth daily.       Marland Kitchen PARoxetine (PAXIL) 10 MG tablet TAKE ONE TABLET EVERY MORNING  90 tablet  0  . polycarbophil (FIBERCON) 625 MG tablet Take 625 mg by mouth 2 (two) times daily.        . vitamin B-12 (CYANOCOBALAMIN) 1000 MCG tablet Take 1,000 mcg by mouth daily.       . fluticasone (FLONASE) 50 MCG/ACT nasal spray Place 1 spray into the nose daily. 1 spray each nostril daily  16 g  5    Allergies  Allergen Reactions  . Sulfa Antibiotics Other (See Comments)    unknown    Review of Systems  Review of Systems  Constitutional: Negative for fever and malaise/fatigue.  HENT: Negative for congestion.   Eyes: Negative for discharge.  Respiratory: Negative for shortness of breath.   Cardiovascular: Negative for chest pain, palpitations and leg swelling.  Gastrointestinal: Negative for nausea, abdominal pain and diarrhea.  Genitourinary: Negative for dysuria.    Musculoskeletal: Positive for joint pain. Negative for falls.       Left ankle twisted now with pain and swelling over medial malleolus  Skin: Negative for rash.  Neurological: Negative for loss of consciousness and headaches.  Endo/Heme/Allergies: Negative for polydipsia.  Psychiatric/Behavioral: Negative for depression and suicidal ideas. The patient is not nervous/anxious and does not have insomnia.     Objective  BP 125/80  Pulse 60  Temp 97.5 F (36.4 C) (Temporal)  Ht 5\' 2"  (1.575 m)  Wt 129 lb 12.8 oz (58.877 kg)  BMI 23.74 kg/m2  SpO2 98%  Physical Exam  Physical Exam  Constitutional: She is oriented to person, place, and time and well-developed, well-nourished, and in no distress. No distress.  HENT:  Head: Normocephalic and atraumatic.  Eyes: Conjunctivae are normal.  Neck: Neck supple. No thyromegaly present.  Cardiovascular: Normal rate, regular rhythm and normal heart sounds.   No murmur heard. Pulmonary/Chest: Effort normal and breath sounds normal. She has no wheezes.  Abdominal: She exhibits no distension and no mass.  Musculoskeletal: She exhibits edema and tenderness.       Pain over lateral malleolus with ecchymosis and pain with palp and movement  Lymphadenopathy:    She has no cervical adenopathy.  Neurological: She is alert and oriented to person, place, and time.  Skin: Skin is warm and dry. No rash noted. She is not diaphoretic.  Psychiatric: Memory, affect and judgment normal.    No results found for this basename: TSH   Lab Results  Component Value Date   WBC 7.9 09/18/2011   HGB 13.0 09/18/2011   HCT 38.7 09/18/2011   MCV 89.0 09/18/2011   PLT 213 09/18/2011   Lab Results  Component Value Date   CREATININE 0.91 09/19/2011   BUN 17 09/19/2011   NA 140 09/19/2011   K 3.9 09/19/2011   CL 103 09/19/2011   CO2 27 09/19/2011   Lab Results  Component Value Date   ALT 17 09/18/2011   AST 16 09/18/2011   ALKPHOS 55 09/18/2011    BILITOT 0.3 09/18/2011   Lab Results  Component Value Date   CHOL 161 09/15/2011   Lab Results  Component Value Date   HDL 53 09/15/2011   Lab Results  Component Value Date   LDLCALC 90 09/15/2011   Lab Results  Component Value Date   TRIG 91 09/15/2011   Lab Results  Component Value Date  CHOLHDL 3.0 09/15/2011     Assessment & Plan  Left ankle sprain Inversion injury last night when she slipped in her kitchen, xray negative, she is encouraged to apply ice tid, apply Aspercreme and given an ace wrap to wear for the next week. elevate

## 2012-05-25 NOTE — Assessment & Plan Note (Signed)
Inversion injury last night when she slipped in her kitchen, xray negative, she is encouraged to apply ice tid, apply Aspercreme and given an ace wrap to wear for the next week. elevate

## 2012-07-10 ENCOUNTER — Ambulatory Visit (INDEPENDENT_AMBULATORY_CARE_PROVIDER_SITE_OTHER): Payer: Medicare Other | Admitting: Family Medicine

## 2012-07-10 ENCOUNTER — Encounter: Payer: Self-pay | Admitting: Family Medicine

## 2012-07-10 VITALS — BP 124/72 | HR 75 | Temp 98.1°F | Ht 62.0 in | Wt 124.1 lb

## 2012-07-10 DIAGNOSIS — F32A Depression, unspecified: Secondary | ICD-10-CM

## 2012-07-10 DIAGNOSIS — Z23 Encounter for immunization: Secondary | ICD-10-CM

## 2012-07-10 DIAGNOSIS — L989 Disorder of the skin and subcutaneous tissue, unspecified: Secondary | ICD-10-CM

## 2012-07-10 DIAGNOSIS — N390 Urinary tract infection, site not specified: Secondary | ICD-10-CM | POA: Diagnosis not present

## 2012-07-10 DIAGNOSIS — F418 Other specified anxiety disorders: Secondary | ICD-10-CM

## 2012-07-10 DIAGNOSIS — M810 Age-related osteoporosis without current pathological fracture: Secondary | ICD-10-CM | POA: Diagnosis not present

## 2012-07-10 DIAGNOSIS — K5289 Other specified noninfective gastroenteritis and colitis: Secondary | ICD-10-CM

## 2012-07-10 DIAGNOSIS — K529 Noninfective gastroenteritis and colitis, unspecified: Secondary | ICD-10-CM

## 2012-07-10 DIAGNOSIS — E079 Disorder of thyroid, unspecified: Secondary | ICD-10-CM

## 2012-07-10 DIAGNOSIS — Z124 Encounter for screening for malignant neoplasm of cervix: Secondary | ICD-10-CM | POA: Insufficient documentation

## 2012-07-10 DIAGNOSIS — E785 Hyperlipidemia, unspecified: Secondary | ICD-10-CM | POA: Diagnosis not present

## 2012-07-10 DIAGNOSIS — E039 Hypothyroidism, unspecified: Secondary | ICD-10-CM

## 2012-07-10 DIAGNOSIS — F341 Dysthymic disorder: Secondary | ICD-10-CM

## 2012-07-10 DIAGNOSIS — Z0000 Encounter for general adult medical examination without abnormal findings: Secondary | ICD-10-CM

## 2012-07-10 DIAGNOSIS — F329 Major depressive disorder, single episode, unspecified: Secondary | ICD-10-CM

## 2012-07-10 HISTORY — DX: Encounter for screening for malignant neoplasm of cervix: Z12.4

## 2012-07-10 HISTORY — DX: Disorder of the skin and subcutaneous tissue, unspecified: L98.9

## 2012-07-10 MED ORDER — PAROXETINE HCL 10 MG PO TABS: 10.0000 mg | ORAL_TABLET | ORAL | Status: DC

## 2012-07-10 MED ORDER — PROBIOTIC PRODUCT PO CHEW: CHEWABLE_TABLET | ORAL | Status: DC

## 2012-07-10 MED ORDER — ATORVASTATIN CALCIUM 10 MG PO TABS: 10.0000 mg | ORAL_TABLET | Freq: Every day | ORAL | Status: DC

## 2012-07-10 MED ORDER — ONDANSETRON 8 MG PO TBDP: 8.0000 mg | ORAL_TABLET | Freq: Three times a day (TID) | ORAL | Status: DC | PRN

## 2012-07-10 MED ORDER — LEVOTHYROXINE SODIUM 75 MCG PO TABS: 75.0000 ug | ORAL_TABLET | Freq: Every day | ORAL | Status: DC

## 2012-07-10 MED ORDER — CIPROFLOXACIN HCL 500 MG PO TABS: 500.0000 mg | ORAL_TABLET | ORAL | Status: DC | PRN

## 2012-07-10 NOTE — Assessment & Plan Note (Signed)
Check tsh today. Refill given on Synthyroid today

## 2012-07-10 NOTE — Assessment & Plan Note (Signed)
Has only taken 1 dose of Ciprofloxacin recently, encouraged to take Cranberry tabs prn when symptoms develop, given a refill on Cipro today

## 2012-07-10 NOTE — Patient Instructions (Addendum)
Preventive Care for Adults, Female A healthy lifestyle and preventive care can promote health and wellness. Preventive health guidelines for women include the following key practices.  A routine yearly physical is a good way to check with your caregiver about your health and preventive screening. It is a chance to share any concerns and updates on your health, and to receive a thorough exam.  Visit your dentist for a routine exam and preventive care every 6 months. Brush your teeth twice a day and floss once a day. Good oral hygiene prevents tooth decay and gum disease.  The frequency of eye exams is based on your age, health, family medical history, use of contact lenses, and other factors. Follow your caregiver's recommendations for frequency of eye exams.  Eat a healthy diet. Foods like vegetables, fruits, whole grains, low-fat dairy products, and lean protein foods contain the nutrients you need without too many calories. Decrease your intake of foods high in solid fats, added sugars, and salt. Eat the right amount of calories for you.Get information about a proper diet from your caregiver, if necessary.  Regular physical exercise is one of the most important things you can do for your health. Most adults should get at least 150 minutes of moderate-intensity exercise (any activity that increases your heart rate and causes you to sweat) each week. In addition, most adults need muscle-strengthening exercises on 2 or more days a week.  Maintain a healthy weight. The body mass index (BMI) is a screening tool to identify possible weight problems. It provides an estimate of body fat based on height and weight. Your caregiver can help determine your BMI, and can help you achieve or maintain a healthy weight.For adults 20 years and older:  A BMI below 18.5 is considered underweight.  A BMI of 18.5 to 24.9 is normal.  A BMI of 25 to 29.9 is considered overweight.  A BMI of 30 and above is  considered obese.  Maintain normal blood lipids and cholesterol levels by exercising and minimizing your intake of saturated fat. Eat a balanced diet with plenty of fruit and vegetables. Blood tests for lipids and cholesterol should begin at age 20 and be repeated every 5 years. If your lipid or cholesterol levels are high, you are over 50, or you are at high risk for heart disease, you may need your cholesterol levels checked more frequently.Ongoing high lipid and cholesterol levels should be treated with medicines if diet and exercise are not effective.  If you smoke, find out from your caregiver how to quit. If you do not use tobacco, do not start.  If you are pregnant, do not drink alcohol. If you are breastfeeding, be very cautious about drinking alcohol. If you are not pregnant and choose to drink alcohol, do not exceed 1 drink per day. One drink is considered to be 12 ounces (355 mL) of beer, 5 ounces (148 mL) of wine, or 1.5 ounces (44 mL) of liquor.  Avoid use of street drugs. Do not share needles with anyone. Ask for help if you need support or instructions about stopping the use of drugs.  High blood pressure causes heart disease and increases the risk of stroke. Your blood pressure should be checked at least every 1 to 2 years. Ongoing high blood pressure should be treated with medicines if weight loss and exercise are not effective.  If you are 55 to 74 years old, ask your caregiver if you should take aspirin to prevent strokes.  Diabetes   screening involves taking a blood sample to check your fasting blood sugar level. This should be done once every 3 years, after age 45, if you are within normal weight and without risk factors for diabetes. Testing should be considered at a younger age or be carried out more frequently if you are overweight and have at least 1 risk factor for diabetes.  Breast cancer screening is essential preventive care for women. You should practice "breast  self-awareness." This means understanding the normal appearance and feel of your breasts and may include breast self-examination. Any changes detected, no matter how small, should be reported to a caregiver. Women in their 20s and 30s should have a clinical breast exam (CBE) by a caregiver as part of a regular health exam every 1 to 3 years. After age 40, women should have a CBE every year. Starting at age 40, women should consider having a mammography (breast X-ray test) every year. Women who have a family history of breast cancer should talk to their caregiver about genetic screening. Women at a high risk of breast cancer should talk to their caregivers about having magnetic resonance imaging (MRI) and a mammography every year.  The Pap test is a screening test for cervical cancer. A Pap test can show cell changes on the cervix that might become cervical cancer if left untreated. A Pap test is a procedure in which cells are obtained and examined from the lower end of the uterus (cervix).  Women should have a Pap test starting at age 21.  Between ages 21 and 29, Pap tests should be repeated every 2 years.  Beginning at age 30, you should have a Pap test every 3 years as long as the past 3 Pap tests have been normal.  Some women have medical problems that increase the chance of getting cervical cancer. Talk to your caregiver about these problems. It is especially important to talk to your caregiver if a new problem develops soon after your last Pap test. In these cases, your caregiver may recommend more frequent screening and Pap tests.  The above recommendations are the same for women who have or have not gotten the vaccine for human papillomavirus (HPV).  If you had a hysterectomy for a problem that was not cancer or a condition that could lead to cancer, then you no longer need Pap tests. Even if you no longer need a Pap test, a regular exam is a good idea to make sure no other problems are  starting.  If you are between ages 65 and 70, and you have had normal Pap tests going back 10 years, you no longer need Pap tests. Even if you no longer need a Pap test, a regular exam is a good idea to make sure no other problems are starting.  If you have had past treatment for cervical cancer or a condition that could lead to cancer, you need Pap tests and screening for cancer for at least 20 years after your treatment.  If Pap tests have been discontinued, risk factors (such as a new sexual partner) need to be reassessed to determine if screening should be resumed.  The HPV test is an additional test that may be used for cervical cancer screening. The HPV test looks for the virus that can cause the cell changes on the cervix. The cells collected during the Pap test can be tested for HPV. The HPV test could be used to screen women aged 30 years and older, and should   be used in women of any age who have unclear Pap test results. After the age of 30, women should have HPV testing at the same frequency as a Pap test.  Colorectal cancer can be detected and often prevented. Most routine colorectal cancer screening begins at the age of 50 and continues through age 75. However, your caregiver may recommend screening at an earlier age if you have risk factors for colon cancer. On a yearly basis, your caregiver may provide home test kits to check for hidden blood in the stool. Use of a small camera at the end of a tube, to directly examine the colon (sigmoidoscopy or colonoscopy), can detect the earliest forms of colorectal cancer. Talk to your caregiver about this at age 50, when routine screening begins. Direct examination of the colon should be repeated every 5 to 10 years through age 75, unless early forms of pre-cancerous polyps or small growths are found.  Hepatitis C blood testing is recommended for all people born from 1945 through 1965 and any individual with known risks for hepatitis C.  Practice  safe sex. Use condoms and avoid high-risk sexual practices to reduce the spread of sexually transmitted infections (STIs). STIs include gonorrhea, chlamydia, syphilis, trichomonas, herpes, HPV, and human immunodeficiency virus (HIV). Herpes, HIV, and HPV are viral illnesses that have no cure. They can result in disability, cancer, and death. Sexually active women aged 25 and younger should be checked for chlamydia. Older women with new or multiple partners should also be tested for chlamydia. Testing for other STIs is recommended if you are sexually active and at increased risk.  Osteoporosis is a disease in which the bones lose minerals and strength with aging. This can result in serious bone fractures. The risk of osteoporosis can be identified using a bone density scan. Women ages 65 and over and women at risk for fractures or osteoporosis should discuss screening with their caregivers. Ask your caregiver whether you should take a calcium supplement or vitamin D to reduce the rate of osteoporosis.  Menopause can be associated with physical symptoms and risks. Hormone replacement therapy is available to decrease symptoms and risks. You should talk to your caregiver about whether hormone replacement therapy is right for you.  Use sunscreen with sun protection factor (SPF) of 30 or more. Apply sunscreen liberally and repeatedly throughout the day. You should seek shade when your shadow is shorter than you. Protect yourself by wearing long sleeves, pants, a wide-brimmed hat, and sunglasses year round, whenever you are outdoors.  Once a month, do a whole body skin exam, using a mirror to look at the skin on your back. Notify your caregiver of new moles, moles that have irregular borders, moles that are larger than a pencil eraser, or moles that have changed in shape or color.  Stay current with required immunizations.  Influenza. You need a dose every fall (or winter). The composition of the flu vaccine  changes each year, so being vaccinated once is not enough.  Pneumococcal polysaccharide. You need 1 to 2 doses if you smoke cigarettes or if you have certain chronic medical conditions. You need 1 dose at age 65 (or older) if you have never been vaccinated.  Tetanus, diphtheria, pertussis (Tdap, Td). Get 1 dose of Tdap vaccine if you are younger than age 65, are over 65 and have contact with an infant, are a healthcare worker, are pregnant, or simply want to be protected from whooping cough. After that, you need a Td   booster dose every 10 years. Consult your caregiver if you have not had at least 3 tetanus and diphtheria-containing shots sometime in your life or have a deep or dirty wound.  HPV. You need this vaccine if you are a woman age 26 or younger. The vaccine is given in 3 doses over 6 months.  Measles, mumps, rubella (MMR). You need at least 1 dose of MMR if you were born in 1957 or later. You may also need a second dose.  Meningococcal. If you are age 19 to 21 and a first-year college student living in a residence hall, or have one of several medical conditions, you need to get vaccinated against meningococcal disease. You may also need additional booster doses.  Zoster (shingles). If you are age 60 or older, you should get this vaccine.  Varicella (chickenpox). If you have never had chickenpox or you were vaccinated but received only 1 dose, talk to your caregiver to find out if you need this vaccine.  Hepatitis A. You need this vaccine if you have a specific risk factor for hepatitis A virus infection or you simply wish to be protected from this disease. The vaccine is usually given as 2 doses, 6 to 18 months apart.  Hepatitis B. You need this vaccine if you have a specific risk factor for hepatitis B virus infection or you simply wish to be protected from this disease. The vaccine is given in 3 doses, usually over 6 months. Preventive Services / Frequency Ages 19 to 39  Blood  pressure check.** / Every 1 to 2 years.  Lipid and cholesterol check.** / Every 5 years beginning at age 20.  Clinical breast exam.** / Every 3 years for women in their 20s and 30s.  Pap test.** / Every 2 years from ages 21 through 29. Every 3 years starting at age 30 through age 65 or 70 with a history of 3 consecutive normal Pap tests.  HPV screening.** / Every 3 years from ages 30 through ages 65 to 70 with a history of 3 consecutive normal Pap tests.  Hepatitis C blood test.** / For any individual with known risks for hepatitis C.  Skin self-exam. / Monthly.  Influenza immunization.** / Every year.  Pneumococcal polysaccharide immunization.** / 1 to 2 doses if you smoke cigarettes or if you have certain chronic medical conditions.  Tetanus, diphtheria, pertussis (Tdap, Td) immunization. / A one-time dose of Tdap vaccine. After that, you need a Td booster dose every 10 years.  HPV immunization. / 3 doses over 6 months, if you are 26 and younger.  Measles, mumps, rubella (MMR) immunization. / You need at least 1 dose of MMR if you were born in 1957 or later. You may also need a second dose.  Meningococcal immunization. / 1 dose if you are age 19 to 21 and a first-year college student living in a residence hall, or have one of several medical conditions, you need to get vaccinated against meningococcal disease. You may also need additional booster doses.  Varicella immunization.** / Consult your caregiver.  Hepatitis A immunization.** / Consult your caregiver. 2 doses, 6 to 18 months apart.  Hepatitis B immunization.** / Consult your caregiver. 3 doses usually over 6 months. Ages 40 to 64  Blood pressure check.** / Every 1 to 2 years.  Lipid and cholesterol check.** / Every 5 years beginning at age 20.  Clinical breast exam.** / Every year after age 40.  Mammogram.** / Every year beginning at age 40   and continuing for as long as you are in good health. Consult with your  caregiver.  Pap test.** / Every 3 years starting at age 30 through age 65 or 70 with a history of 3 consecutive normal Pap tests.  HPV screening.** / Every 3 years from ages 30 through ages 65 to 70 with a history of 3 consecutive normal Pap tests.  Fecal occult blood test (FOBT) of stool. / Every year beginning at age 50 and continuing until age 75. You may not need to do this test if you get a colonoscopy every 10 years.  Flexible sigmoidoscopy or colonoscopy.** / Every 5 years for a flexible sigmoidoscopy or every 10 years for a colonoscopy beginning at age 50 and continuing until age 75.  Hepatitis C blood test.** / For all people born from 1945 through 1965 and any individual with known risks for hepatitis C.  Skin self-exam. / Monthly.  Influenza immunization.** / Every year.  Pneumococcal polysaccharide immunization.** / 1 to 2 doses if you smoke cigarettes or if you have certain chronic medical conditions.  Tetanus, diphtheria, pertussis (Tdap, Td) immunization.** / A one-time dose of Tdap vaccine. After that, you need a Td booster dose every 10 years.  Measles, mumps, rubella (MMR) immunization. / You need at least 1 dose of MMR if you were born in 1957 or later. You may also need a second dose.  Varicella immunization.** / Consult your caregiver.  Meningococcal immunization.** / Consult your caregiver.  Hepatitis A immunization.** / Consult your caregiver. 2 doses, 6 to 18 months apart.  Hepatitis B immunization.** / Consult your caregiver. 3 doses, usually over 6 months. Ages 65 and over  Blood pressure check.** / Every 1 to 2 years.  Lipid and cholesterol check.** / Every 5 years beginning at age 20.  Clinical breast exam.** / Every year after age 40.  Mammogram.** / Every year beginning at age 40 and continuing for as long as you are in good health. Consult with your caregiver.  Pap test.** / Every 3 years starting at age 30 through age 65 or 70 with a 3  consecutive normal Pap tests. Testing can be stopped between 65 and 70 with 3 consecutive normal Pap tests and no abnormal Pap or HPV tests in the past 10 years.  HPV screening.** / Every 3 years from ages 30 through ages 65 or 70 with a history of 3 consecutive normal Pap tests. Testing can be stopped between 65 and 70 with 3 consecutive normal Pap tests and no abnormal Pap or HPV tests in the past 10 years.  Fecal occult blood test (FOBT) of stool. / Every year beginning at age 50 and continuing until age 75. You may not need to do this test if you get a colonoscopy every 10 years.  Flexible sigmoidoscopy or colonoscopy.** / Every 5 years for a flexible sigmoidoscopy or every 10 years for a colonoscopy beginning at age 50 and continuing until age 75.  Hepatitis C blood test.** / For all people born from 1945 through 1965 and any individual with known risks for hepatitis C.  Osteoporosis screening.** / A one-time screening for women ages 65 and over and women at risk for fractures or osteoporosis.  Skin self-exam. / Monthly.  Influenza immunization.** / Every year.  Pneumococcal polysaccharide immunization.** / 1 dose at age 65 (or older) if you have never been vaccinated.  Tetanus, diphtheria, pertussis (Tdap, Td) immunization. / A one-time dose of Tdap vaccine if you are over   65 and have contact with an infant, are a healthcare worker, or simply want to be protected from whooping cough. After that, you need a Td booster dose every 10 years.  Varicella immunization.** / Consult your caregiver.  Meningococcal immunization.** / Consult your caregiver.  Hepatitis A immunization.** / Consult your caregiver. 2 doses, 6 to 18 months apart.  Hepatitis B immunization.** / Check with your caregiver. 3 doses, usually over 6 months. ** Family history and personal history of risk and conditions may change your caregiver's recommendations. Document Released: 11/09/2001 Document Revised: 12/06/2011  Document Reviewed: 02/08/2011 [employer] Patient Information 2013 [employer]  

## 2012-07-10 NOTE — Progress Notes (Signed)
Patient ID: Molly Fleming, female   DOB: [birth year], 76 y.o.   MRN: [id number] Molly Fleming [id number] Oct 30, 1935 07/10/2012      Progress Note New Patient  Subjective  Chief Complaint  Chief Complaint  Patient presents with  . Annual Exam    physical, pap, and breast exam    HPI  This is a 76 year old Caucasian female who is in today for annual GYN exam. Overall she's doing relatively well her adoptive mother just passed away at 74 years of age on 03/24/2012. While she said by this also means she does not have thickened continue to travel long distances to help her mother's care. She was the last remaining of her siblings and has had 5. She only knows her biologic mother died at 74 yo of some form of cancer and her father died ay 74 with heart disease. She denies any recent illness , fevers, chills, chest pain palpitations, shortness of breath, GI or GU complaints. She does note there is an irritated lesion on the back of her left thigh it is not bleeding or ulcerating is not changing colors she is due to see the dermatologist and had a little bit soon.  Past Medical History  Diagnosis Date  . Osteoporosis     started taking meds at age 54 then just went off recently- due to conflict she has heard  . Hyperlipidemia   . Allergy     seasonal  . Mumps as a child  . Measles   . Whooping cough as a child  . Cystic disease of breast   . Arthritis     right hand supplement  . Depression   . Thyroid disease     s/p thyroidectomy, goiter  . Actinic keratosis 08/04/2011  . Preventative health care 08/04/2011  . Frequent UTI 08/04/2011  . Headache   . Left ankle sprain 05/22/2012  . Cervical cancer screening 07/10/2012    Past Surgical History  Procedure Date  . Removed part of thyroid 2002  . Breast surgery     breast biopsies, b/l benign  . Abdominal hysterectomy 1982    partial still has ovaries, uterine prolapse and menorraghia  . Tee without cardioversion 09/16/2011   Procedure: TRANSESOPHAGEAL ECHOCARDIOGRAM (TEE);  Surgeon: Peter Swaziland, MD;  Location: [facility] ENDOSCOPY;  Service: Cardiovascular;  Laterality: N/A;    Family History  Problem Relation Age of Onset  . Adopted: Yes  . Cancer Mother   . Heart attack Father   . Heart disease Father   . Cancer Brother     colon  . Anxiety disorder Son     History   Social History  . Marital Status: Married    Spouse Name: N/A    Number of Children: N/A  . Years of Education: N/A   Occupational History  . Not on file.   Social History Main Topics  . Smoking status: Never Smoker   . Smokeless tobacco: Never Used  . Alcohol Use: No  . Drug Use: No  . Sexually Active: Yes -- Female partner(s)   Other Topics Concern  . Not on file   Social History Narrative  . No narrative on file    Current Outpatient Prescriptions on File Prior to Visit  Medication Sig Dispense Refill  . aspirin 325 MG tablet Take 325 mg by mouth daily.        . Calcium Carbonate-Vitamin D (CALCIUM 600 + D PO) Take 1 tablet by mouth 2 (two) times daily.       Marland Kitchen  cetirizine (ZYRTEC) 10 MG tablet Take 10 mg by mouth daily as needed.       . Omega-3 Fatty Acids (FISH OIL) 1000 MG CAPS Take 1 capsule by mouth daily.       . polycarbophil (FIBERCON) 625 MG tablet Take 625 mg by mouth 2 (two) times daily.        . vitamin B-12 (CYANOCOBALAMIN) 1000 MCG tablet Take 1,000 mcg by mouth daily.       Marland Kitchen DISCONTD: atorvastatin (LIPITOR) 10 MG tablet TAKE 1 TABLET DAILY  90 tablet  0  . DISCONTD: levothyroxine (SYNTHROID, LEVOTHROID) 75 MCG tablet TAKE 1 TABLET DAILY  90 tablet  0  . DISCONTD: PARoxetine (PAXIL) 10 MG tablet TAKE ONE TABLET EVERY MORNING  90 tablet  0  . fluticasone (FLONASE) 50 MCG/ACT nasal spray Place 1 spray into the nose daily. 1 spray each nostril daily  16 g  5    Allergies  Allergen Reactions  . Sulfa Antibiotics Other (See Comments)    unknown    Review of Systems  Review of Systems  Constitutional:  Negative for fever, chills and malaise/fatigue.  HENT: Positive for congestion. Negative for hearing loss and nosebleeds.   Eyes: Negative for discharge.  Respiratory: Negative for cough, sputum production, shortness of breath and wheezing.   Cardiovascular: Negative for chest pain, palpitations and leg swelling.  Gastrointestinal: Negative for heartburn, nausea, vomiting, abdominal pain, diarrhea, constipation and blood in stool.  Genitourinary: Negative for dysuria, urgency, frequency and hematuria.  Musculoskeletal: Negative for myalgias, back pain and falls.  Skin: Negative for rash.  Neurological: Negative for dizziness, tremors, sensory change, focal weakness, loss of consciousness, weakness and headaches.  Endo/Heme/Allergies: Negative for polydipsia. Does not bruise/bleed easily.  Psychiatric/Behavioral: Negative for depression and suicidal ideas. The patient is not nervous/anxious and does not have insomnia.     Objective  BP 124/72  Pulse 75  Temp 98.1 F (36.7 C) (Temporal)  Ht 5\' 2"  (1.575 m)  Wt 124 lb 1.9 oz (56.3 kg)  BMI 22.70 kg/m2  SpO2 99%  Physical Exam  Physical Exam  Constitutional: She is oriented to person, place, and time and well-developed, well-nourished, and in no distress. No distress.  HENT:  Head: Normocephalic and atraumatic.  Eyes: Conjunctivae normal are normal.  Neck: Neck supple. No thyromegaly present.  Cardiovascular: Normal rate, regular rhythm and normal heart sounds.   No murmur heard. Pulmonary/Chest: Effort normal and breath sounds normal. She has no wheezes.  Abdominal: She exhibits no distension and no mass.  Genitourinary: Vagina normal, right adnexa normal and left adnexa normal. No vaginal discharge found.       Breast exam unremarkable b/l. No lumps, skin changes or discharge  Musculoskeletal: She exhibits no edema.  Lymphadenopathy:    She has no cervical adenopathy.  Neurological: She is alert and oriented to person, place,  and time.  Skin: Skin is warm and dry. No rash noted. She is not diaphoretic.       1/2 cm lesion, raised, hard on left posterior thigh. No erythema or ulceration  Psychiatric: Memory, affect and judgment normal.       Assessment & Plan  Cervical cancer screening Pap today, no concerns identified on exam today, repeat pap/pelvic in 3 years if health holds.   Depression Doing well on low dose Paroxetine 10 mg daily. Did just loose her 51 year old mother in June 2013 but feels she is handling it relatively well  Frequent UTI Has only  taken 1 dose of Ciprofloxacin recently, encouraged to take Cranberry tabs prn when symptoms develop, given a refill on Cipro today  Osteoporosis Encouraged Ca/vit d bid with increased exercise  Thyroid disease Check tsh today. Refill given on Synthyroid today  Preventative health care Given flu and Tdap today. Pap done, MGM order given. Encouraged regular exercise and heart healthy diet  Skin lesion of left leg Looks verrucous but agrees to call her dermatologist and have it looked at.

## 2012-07-10 NOTE — Assessment & Plan Note (Signed)
Doing well on low dose Paroxetine 10 mg daily. Did just loose her 74 year old mother in June 2013 but feels she is handling it relatively well

## 2012-07-10 NOTE — Assessment & Plan Note (Signed)
Encouraged Ca/vit d bid with increased exercise

## 2012-07-10 NOTE — Assessment & Plan Note (Addendum)
Pap today, no concerns identified on exam today, repeat pap/pelvic in 3 years if health holds.

## 2012-07-10 NOTE — Assessment & Plan Note (Signed)
Given flu and Tdap today. Pap done, MGM order given. Encouraged regular exercise and heart healthy diet

## 2012-07-10 NOTE — Assessment & Plan Note (Signed)
Looks verrucous but agrees to call her dermatologist and have it looked at.

## 2012-07-11 ENCOUNTER — Other Ambulatory Visit: Payer: Self-pay | Admitting: Emergency Medicine

## 2012-07-11 ENCOUNTER — Other Ambulatory Visit (HOSPITAL_COMMUNITY)
Admission: RE | Admit: 2012-07-11 | Discharge: 2012-07-11 | Disposition: A | Discharge status: Home / Self Care | Payer: Medicare Other | Source: Ambulatory Visit | Attending: Family Medicine | Admitting: Family Medicine

## 2012-07-11 DIAGNOSIS — M810 Age-related osteoporosis without current pathological fracture: Secondary | ICD-10-CM

## 2012-07-11 DIAGNOSIS — E785 Hyperlipidemia, unspecified: Secondary | ICD-10-CM

## 2012-07-11 DIAGNOSIS — N390 Urinary tract infection, site not specified: Secondary | ICD-10-CM

## 2012-07-11 DIAGNOSIS — E039 Hypothyroidism, unspecified: Secondary | ICD-10-CM

## 2012-07-11 DIAGNOSIS — Z0000 Encounter for general adult medical examination without abnormal findings: Secondary | ICD-10-CM

## 2012-07-11 NOTE — Addendum Note (Signed)
Addended by: Baldemar Lenis R on: 07/11/2012 08:44 AM   Modules accepted: Orders

## 2012-07-17 DIAGNOSIS — L820 Inflamed seborrheic keratosis: Secondary | ICD-10-CM | POA: Diagnosis not present

## 2012-07-17 DIAGNOSIS — D235 Other benign neoplasm of skin of trunk: Secondary | ICD-10-CM | POA: Diagnosis not present

## 2012-07-24 ENCOUNTER — Ambulatory Visit: Payer: Medicare Other

## 2012-07-24 DIAGNOSIS — Z23 Encounter for immunization: Secondary | ICD-10-CM

## 2012-07-24 MED ORDER — PNEUMOCOCCAL VAC POLYVALENT 25 MCG/0.5ML IJ INJ: 0.5000 mL | INJECTION | INTRAMUSCULAR | Status: DC

## 2012-07-24 NOTE — Progress Notes (Signed)
  Subjective:    Patient ID: Molly Fleming, female    DOB: [birth year], 76 y.o.   MRN: [id number]  HPI    Review of Systems     Objective:   Physical Exam        Assessment & Plan:  Patient came in today for her pneumonia vaccination. Pt tolerated injection well

## 2012-10-23 DIAGNOSIS — N390 Urinary tract infection, site not specified: Secondary | ICD-10-CM | POA: Diagnosis not present

## 2012-10-23 DIAGNOSIS — R350 Frequency of micturition: Secondary | ICD-10-CM | POA: Diagnosis not present

## 2013-01-10 ENCOUNTER — Encounter: Payer: Self-pay | Admitting: Family Medicine

## 2013-01-10 ENCOUNTER — Ambulatory Visit (INDEPENDENT_AMBULATORY_CARE_PROVIDER_SITE_OTHER): Payer: Medicare Other | Admitting: Nurse Practitioner

## 2013-01-10 VITALS — BP 130/80 | HR 62 | Resp 12 | Ht 62.0 in | Wt 130.0 lb

## 2013-01-10 DIAGNOSIS — E785 Hyperlipidemia, unspecified: Secondary | ICD-10-CM

## 2013-01-10 DIAGNOSIS — N390 Urinary tract infection, site not specified: Secondary | ICD-10-CM

## 2013-01-10 DIAGNOSIS — F341 Dysthymic disorder: Secondary | ICD-10-CM | POA: Diagnosis not present

## 2013-01-10 DIAGNOSIS — F418 Other specified anxiety disorders: Secondary | ICD-10-CM

## 2013-01-10 DIAGNOSIS — E039 Hypothyroidism, unspecified: Secondary | ICD-10-CM

## 2013-01-10 DIAGNOSIS — Z5189 Encounter for other specified aftercare: Secondary | ICD-10-CM

## 2013-01-10 DIAGNOSIS — M810 Age-related osteoporosis without current pathological fracture: Secondary | ICD-10-CM

## 2013-01-10 DIAGNOSIS — T7840XD Allergy, unspecified, subsequent encounter: Secondary | ICD-10-CM

## 2013-01-10 DIAGNOSIS — Z0000 Encounter for general adult medical examination without abnormal findings: Secondary | ICD-10-CM

## 2013-01-10 LAB — RENAL FUNCTION PANEL
Albumin: 4.1 g/dL (ref 3.5–5.2)
Calcium: 9.1 mg/dL (ref 8.4–10.5)
Glucose, Bld: 74 mg/dL (ref 70–99)
Potassium: 5 mEq/L (ref 3.5–5.1)

## 2013-01-10 LAB — LIPID PANEL
Cholesterol: 152 mg/dL (ref 0–200)
LDL Cholesterol: 76 mg/dL (ref 0–99)
Total CHOL/HDL Ratio: 2

## 2013-01-10 LAB — CBC
HCT: 38.5 % (ref 36.0–46.0)
Hemoglobin: 12.8 g/dL (ref 12.0–15.0)
MCV: 90.8 fl (ref 78.0–100.0)
Platelets: 204 10*3/uL (ref 150.0–400.0)
RBC: 4.24 Mil/uL (ref 3.87–5.11)

## 2013-01-10 LAB — HEPATIC FUNCTION PANEL
ALT: 20 U/L (ref 0–35)
Bilirubin, Direct: 0.1 mg/dL (ref 0.0–0.3)
Total Protein: 6.6 g/dL (ref 6.0–8.3)

## 2013-01-10 MED ORDER — CIPROFLOXACIN HCL 500 MG PO TABS: 500.0000 mg | ORAL_TABLET | ORAL | Status: DC | PRN

## 2013-01-10 MED ORDER — FLUTICASONE PROPIONATE 50 MCG/ACT NA SUSP: 1.0000 | Freq: Every day | NASAL | Status: DC

## 2013-01-10 MED ORDER — ATORVASTATIN CALCIUM 10 MG PO TABS: 10.0000 mg | ORAL_TABLET | Freq: Every day | ORAL | Status: DC

## 2013-01-10 MED ORDER — LEVOTHYROXINE SODIUM 75 MCG PO TABS: 75.0000 ug | ORAL_TABLET | Freq: Every day | ORAL | Status: DC

## 2013-01-10 MED ORDER — PAROXETINE HCL 10 MG PO TABS: 10.0000 mg | ORAL_TABLET | ORAL | Status: DC

## 2013-01-10 NOTE — Progress Notes (Signed)
Subjective:     Patient ID: Molly Fleming, female   DOB: June 11, 1936, 77 y.o.   MRN: [id number]  HPI Comments: Depression: pt remains stable mood on paroxetine, evidenced by her stating she enjoys her daily life. Recurrent Urinary Tract Infections: pt has no current symptoms, she reports becoming symptomatic when she travels. I encouraged her to stay hydrated and take 500 mg vit. C twice daily to acidify urine when traveling.  Hyperlipidemia This is a chronic problem. The current episode started more than 1 year ago. The problem is controlled. Recent lipid tests were reviewed and are normal. Exacerbating diseases include hypothyroidism. She has no history of chronic renal disease, diabetes or liver disease. There are no known factors aggravating her hyperlipidemia. Associated symptoms include leg pain. Pertinent negatives include no chest pain, focal weakness, myalgias or shortness of breath. (C/o occasional leg cramps, better when well hydrated.) Current antihyperlipidemic treatment includes statins and exercise. The current treatment provides significant improvement of lipids. There are no compliance problems.  Risk factors for coronary artery disease include dyslipidemia and post-menopausal.  Thyroid Problem Presents for follow-up visit. Symptoms include depressed mood and fatigue. Patient reports no anxiety, cold intolerance, constipation, diaphoresis, diarrhea, dry skin, heat intolerance, hoarse voice, palpitations, tremors, weight gain or weight loss. (Pt states she has been more tired this week as she has her teenage grandsons visiting. She is treated for depression with paroxetine and reports stable mood, enjoys daily activities.) The symptoms have been stable. Her past medical history is significant for hyperlipidemia. There is no history of diabetes.     Review of Systems  Constitutional: Positive for fatigue. Negative for weight loss, weight gain and diaphoresis.       Pt has been tired  this week due to having teenage grandsons visit.  HENT: Positive for hearing loss. Negative for congestion, hoarse voice, rhinorrhea, sneezing and postnasal drip.        Hearing loss occurred 1 year ago when had CVA. No change. Uses hearing aid. Not wearing today, but no problems communicating.  Eyes: Positive for redness.       Pt states she visited opthalomologist recently due to itchy red eyes and was treated for allergic conjunctivitis.  Respiratory: Negative for cough, chest tightness and shortness of breath.   Cardiovascular: Negative for chest pain, palpitations and leg swelling.  Gastrointestinal: Negative for diarrhea and constipation.  Endocrine: Negative for cold intolerance and heat intolerance.       Weight up 6 lbs in 6 mos, but down 33 lbs in year. Generally Stable in last 3 visits.  Genitourinary: Positive for dysuria and urgency.       Pt experiences these symptoms, mostly when traveling. She has used cipro as needed with relief: she takes 500 mg tab qd X 3 days.   Musculoskeletal: Negative for myalgias.  Allergic/Immunologic: Positive for environmental allergies.       Pt states allergic symptoms of rhinitis & itchy eyes mostly flare in summer. She uses flonase during summer months with relief.  Neurological: Positive for dizziness. Negative for tremors, focal weakness, syncope, facial asymmetry, speech difficulty, weakness, light-headedness, numbness and headaches.       Pt reports dizzyness in past at time of CVA, no recurrence.       Objective:   Physical Exam  Constitutional: She is oriented to person, place, and time. She appears well-developed and well-nourished. No distress.  HENT:  Head: Normocephalic.  Right Ear: External ear normal.  Left Ear: External ear normal.  Eyes: Conjunctivae and EOM are normal. Pupils are equal, round, and reactive to light.  Neck: Normal range of motion. Neck supple. No JVD present. No tracheal deviation present. No thyromegaly  present.  Cardiovascular: Normal rate, regular rhythm and normal heart sounds.   Pulmonary/Chest: No respiratory distress. She has no wheezes.  Musculoskeletal: She exhibits no edema.  Pedal pulses +2 bilaterally.  Lymphadenopathy:    She has no cervical adenopathy.  Neurological: She is alert and oriented to person, place, and time.  Skin: Skin is warm and dry. No rash noted.  Psychiatric: She has a normal mood and affect. Her behavior is normal.       Assessment:     1)Depression: mood stable. 2)recurrent URI: symptoms occur during traveling. Asymptomatic today 3)hyperlipidemia: last labs 12/12 stable. HDL & LDL in therapeutic range with history of CVA: 53/90    Respectively. 4)Hypothyroidism: asymptomatic with current treatment-levothyroxine 75 mg qd. TSH due. Weight stable. Healthy BMI.  .   Plan:     1) Continue with paroxetine, administer PHQ-9 next visit   2) Drink plenty of fluids when traveling, take Vit C 500 mg twice daily while traveling for prophylaxis. OK to take cipro as prescribed if become symptomatic while traveling. Check CBC & renal function today. 3)Continue atorvastatin and daily exercise. Check lipids, hepatic function today. 4)Continue current levothyroxine, check TSh today, adjust as needed.

## 2013-01-10 NOTE — Patient Instructions (Signed)
Follow up in 6 months for yearly exam and flu vaccine. OK to take 500 mg Vitamin C twice daily to discourage urinary bacterial growth. Try leg stretches and deep breathing in evening, for at least 10 minutes, to help alleviate restless leg symptoms. Have fun with grandkids!

## 2013-01-15 ENCOUNTER — Telehealth: Payer: Self-pay | Admitting: Nurse Practitioner

## 2013-01-15 NOTE — Telephone Encounter (Signed)
Called pharmacy, gave directions for Cipro. Spoke w/Victor.

## 2013-05-02 ENCOUNTER — Other Ambulatory Visit: Payer: Self-pay

## 2013-07-12 ENCOUNTER — Ambulatory Visit: Payer: Medicare Other | Admitting: Nurse Practitioner

## 2013-07-17 ENCOUNTER — Encounter: Payer: Self-pay | Admitting: Nurse Practitioner

## 2013-07-17 ENCOUNTER — Ambulatory Visit (INDEPENDENT_AMBULATORY_CARE_PROVIDER_SITE_OTHER): Payer: Medicare Other | Admitting: Nurse Practitioner

## 2013-07-17 VITALS — BP 110/70 | HR 64 | Temp 98.1°F | Ht 62.0 in | Wt 151.5 lb

## 2013-07-17 DIAGNOSIS — J309 Allergic rhinitis, unspecified: Secondary | ICD-10-CM

## 2013-07-17 DIAGNOSIS — Z124 Encounter for screening for malignant neoplasm of cervix: Secondary | ICD-10-CM

## 2013-07-17 DIAGNOSIS — T7840XD Allergy, unspecified, subsequent encounter: Secondary | ICD-10-CM

## 2013-07-17 DIAGNOSIS — K219 Gastro-esophageal reflux disease without esophagitis: Secondary | ICD-10-CM

## 2013-07-17 DIAGNOSIS — Z23 Encounter for immunization: Secondary | ICD-10-CM

## 2013-07-17 DIAGNOSIS — E785 Hyperlipidemia, unspecified: Secondary | ICD-10-CM | POA: Diagnosis not present

## 2013-07-17 DIAGNOSIS — N6019 Diffuse cystic mastopathy of unspecified breast: Secondary | ICD-10-CM

## 2013-07-17 DIAGNOSIS — H1010 Acute atopic conjunctivitis, unspecified eye: Secondary | ICD-10-CM

## 2013-07-17 DIAGNOSIS — E039 Hypothyroidism, unspecified: Secondary | ICD-10-CM

## 2013-07-17 DIAGNOSIS — F341 Dysthymic disorder: Secondary | ICD-10-CM

## 2013-07-17 DIAGNOSIS — I639 Cerebral infarction, unspecified: Secondary | ICD-10-CM

## 2013-07-17 DIAGNOSIS — Z5189 Encounter for other specified aftercare: Secondary | ICD-10-CM

## 2013-07-17 DIAGNOSIS — H1013 Acute atopic conjunctivitis, bilateral: Secondary | ICD-10-CM

## 2013-07-17 DIAGNOSIS — F418 Other specified anxiety disorders: Secondary | ICD-10-CM

## 2013-07-17 DIAGNOSIS — Z1239 Encounter for other screening for malignant neoplasm of breast: Secondary | ICD-10-CM

## 2013-07-17 DIAGNOSIS — I6350 Cerebral infarction due to unspecified occlusion or stenosis of unspecified cerebral artery: Secondary | ICD-10-CM | POA: Diagnosis not present

## 2013-07-17 MED ORDER — OMEPRAZOLE 40 MG PO CPDR: 40.0000 mg | DELAYED_RELEASE_CAPSULE | Freq: Every day | ORAL | Status: DC

## 2013-07-17 MED ORDER — PAROXETINE HCL 10 MG PO TABS: 10.0000 mg | ORAL_TABLET | ORAL | Status: DC

## 2013-07-17 MED ORDER — ATORVASTATIN CALCIUM 10 MG PO TABS: 10.0000 mg | ORAL_TABLET | Freq: Every day | ORAL | Status: DC

## 2013-07-17 MED ORDER — PNEUMOCOCCAL 13-VAL CONJ VACC IM SUSP: 0.5000 mL | Freq: Once | INTRAMUSCULAR | Status: DC

## 2013-07-17 MED ORDER — LEVOTHYROXINE SODIUM 75 MCG PO TABS: 75.0000 ug | ORAL_TABLET | Freq: Every day | ORAL | Status: DC

## 2013-07-17 MED ORDER — OLOPATADINE HCL 0.2 % OP SOLN: 1.0000 [drp] | Freq: Every day | OPHTHALMIC | Status: DC

## 2013-07-17 NOTE — Patient Instructions (Addendum)
Start enteric coated aspirin daily. Start omeprazole daily for 4 weeks. If indigestion is better. Do not restart omeprazole. If you still have it most days, you should take it for another 4 weeks.Start allergy eye drop. Stop zyrtec. Continue to use lubricant on lip to minimize trauma. Resume exercise, goal is 30 minutes daily. Eat nutrient dense foods:nuts, berries, dried fruits & berries, fruits & veges, whole wheat breads & cereals, sweet potatoes rather than white, brown rice rather than white. All of these suggestions will increase fiber, nutrients, and help regulate cholesterol. Consider decreasing cholesterol med to 3-4 times weekly rather daily. Calcium goal is 1200 mg-get most of that in foods. Add wrist & ankle weights when you walk for better bone health. Nice to see you! See you next spring!

## 2013-07-18 ENCOUNTER — Encounter: Payer: Self-pay | Admitting: Nurse Practitioner

## 2013-07-18 NOTE — Progress Notes (Signed)
Subjective:     Molly Fleming is a 77 y.o. female and is here for follow up of multiple chronic conditions including hyperlipidemia, depression, hypothyroidism, and seasonal allergies. Additionally, I reviewed preventive care as she is leaving for [facility] for the winter. The patient reports problems - blister L lower lip since she has been wearing "bite guard" at night..  History   Social History  . Marital Status: Married    Spouse Name: N/A    Number of Children: 3  . Years of Education: N/A   Occupational History  . retired    Social History Main Topics  . Smoking status: Never Smoker   . Smokeless tobacco: Never Used  . Alcohol Use: No  . Drug Use: No  . Sexual Activity: Yes    Partners: Male    Birth Control/ Protection: Post-menopausal   Other Topics Concern  . Not on file   Social History Narrative   Ms. Kerrick is retired. She lives with her husband. They reside in [place] and [place] part-year.   Health Maintenance  Topic Date Due  . Influenza Vaccine  04/27/2014  . Colonoscopy  06/27/2021  . Tetanus/tdap  07/10/2022  . Pneumococcal Polysaccharide Vaccine Age 42 And Over  Completed  . Zostavax  Completed    The following portions of the patient's history were reviewed and updated as appropriate: allergies, current medications, past family history, past medical history, past social history, past surgical history and problem list.  Review of Systems Constitutional: positive for weight gain, negative for chills, fatigue, fevers and night sweats Eyes: positive for irritation when allergy season hits-worse when she goes to [facility] negative for redness Ears, nose, mouth, throat, and face: negative Respiratory: negative for cough, sputum and wheezing Cardiovascular: negative for chest pain, chest pressure/discomfort, irregular heart beat, lower extremity edema and near-syncope Gastrointestinal: positive for dyspepsia and takes TUMS several times weekly, negative for nausea and  vomiting Integument/breast: Hx of fibroglandular disease. MMG is due, last was nml per pt. Behavioral/Psych: positive for grinds teeth at night, wearing bite guard, negative for bad mood, decreased appetite, depression, excessive alcohol consumption, fatigue, illegal drug usage, increased appetite, irritability, loss of interest in favorite activities, sleep disturbance and tobacco use Endocrine: negative for temperature intolerance Allergic/Immunologic: positive for eye irritation when lives in [place]., negative for itchy throat & ears   Objective:    BP 110/70  Pulse 64  Temp(Src) 98.1 F (36.7 C) (Oral)  Ht 5\' 2"  (1.575 m)  Wt 151 lb 8 oz (68.72 kg)  BMI 27.7 kg/m2  SpO2 98% General appearance: alert, cooperative, appears stated age and no distress Head: Normocephalic, without obvious abnormality, atraumatic Eyes: negative findings: lids and lashes normal and conjunctivae and sclerae normal Ears: normal TM's and external ear canals both ears Throat: lips, mucosa, and tongue normal; teeth and gums normal and blue round raised lesion L lower lip. Pt noticed since she started wearing bite guard-thinks she bites her lip. Neck: no adenopathy, no carotid bruit, supple, symmetrical, trachea midline and thyroid not enlarged, symmetric, no tenderness/mass/nodules Lungs: clear to auscultation bilaterally Heart: regular rate and rhythm, S1, S2 normal, no murmur, click, rub or gallop Extremities: extremities normal, atraumatic, no cyanosis or edema Pulses: 2+ and symmetric Skin: Skin color, texture, turgor normal. No rashes or lesions Lymph nodes: Cervical, supraclavicular, and axillary nodes normal. Neurologic: Grossly normal    Assessment & Plan: :   1 prev care: no more pap screening unless concerns/symptoms, needs flu & pna vaccines,  needs MMG, colonoscopy up to date, weight gain due to not exercising due to hot weather. Discussed caution in calorie restriction as older folks at risk for  nutritional compromise due to gastric atrophy. Discussed eating nutrient dense foods-nuts, seeds, dried fruits, whole grains, fresh fruits & veggies 2 hyperlipidemia pt wishes to continue statin although data is sparse for efficacy & safety in older people. Discussed importance of diet & exercise to normalize cholesterol 3 seasonal allergies pt describes eye irritation as most bothersome symptom, flares when she lives in [place]. rec starting olopatadine, stop zyrtec. 4 hypothyroidism labs today. No symptoms of hyper or hypothyroid. 5 Reflux . Taking TUMS several times weekly. Advised to switch to enteric coated aspirin. Start omeprazole for 4 weeks. If symptoms persistent, Use for another 4 weeks. If still persistent, see healthcare provider.

## 2013-07-18 NOTE — Assessment & Plan Note (Signed)
Stop zyrtec as pt reports symptoms are mostly limited to eyes. Start olopatadine eye drops.

## 2013-07-18 NOTE — Assessment & Plan Note (Signed)
Diet interventions: Continue to get 1200 mg calcium daily-most through food sources, continue to limit caffeine & alcohol consumption. Exercise: weight bearing exercise daily 30 minutes-walk w/wrist & ankle weights. Pt does not want to use meds. Will not order bone density.

## 2013-07-18 NOTE — Assessment & Plan Note (Signed)
Pt has been on lipitor for many years. She is well controlled. Discussed diet interventions & exercise. Discussed no trial data for use in folks over 80. Pt wants to continue, given Hx of thrombotic stroke.

## 2013-07-18 NOTE — Assessment & Plan Note (Signed)
[place] MM[place]. It has been several years. Will order today.

## 2013-08-02 ENCOUNTER — Ambulatory Visit
Admission: RE | Admit: 2013-08-02 | Discharge: 2013-08-02 | Disposition: A | Discharge status: Home / Self Care | Payer: Medicare Other | Source: Ambulatory Visit | Attending: Nurse Practitioner | Admitting: Nurse Practitioner

## 2013-08-02 DIAGNOSIS — Z1239 Encounter for other screening for malignant neoplasm of breast: Secondary | ICD-10-CM

## 2013-09-01 DIAGNOSIS — R109 Unspecified abdominal pain: Secondary | ICD-10-CM | POA: Diagnosis not present

## 2013-09-01 DIAGNOSIS — I10 Essential (primary) hypertension: Secondary | ICD-10-CM | POA: Diagnosis not present

## 2013-09-01 DIAGNOSIS — R197 Diarrhea, unspecified: Secondary | ICD-10-CM | POA: Diagnosis not present

## 2013-09-01 DIAGNOSIS — R079 Chest pain, unspecified: Secondary | ICD-10-CM | POA: Diagnosis not present

## 2013-09-01 DIAGNOSIS — E785 Hyperlipidemia, unspecified: Secondary | ICD-10-CM | POA: Diagnosis present

## 2013-09-01 DIAGNOSIS — K3580 Unspecified acute appendicitis: Secondary | ICD-10-CM | POA: Diagnosis not present

## 2013-09-01 DIAGNOSIS — Z8673 Personal history of transient ischemic attack (TIA), and cerebral infarction without residual deficits: Secondary | ICD-10-CM | POA: Diagnosis not present

## 2013-09-01 DIAGNOSIS — Z7982 Long term (current) use of aspirin: Secondary | ICD-10-CM | POA: Diagnosis not present

## 2013-09-01 DIAGNOSIS — K37 Unspecified appendicitis: Secondary | ICD-10-CM | POA: Diagnosis not present

## 2013-09-01 DIAGNOSIS — K352 Acute appendicitis with generalized peritonitis, without abscess: Secondary | ICD-10-CM | POA: Diagnosis not present

## 2013-11-05 DIAGNOSIS — M545 Low back pain, unspecified: Secondary | ICD-10-CM | POA: Diagnosis not present

## 2013-11-05 DIAGNOSIS — R1084 Generalized abdominal pain: Secondary | ICD-10-CM | POA: Diagnosis not present

## 2013-11-05 DIAGNOSIS — T1490XA Injury, unspecified, initial encounter: Secondary | ICD-10-CM | POA: Diagnosis not present

## 2013-11-06 DIAGNOSIS — S79919A Unspecified injury of unspecified hip, initial encounter: Secondary | ICD-10-CM | POA: Diagnosis not present

## 2013-11-06 DIAGNOSIS — R109 Unspecified abdominal pain: Secondary | ICD-10-CM | POA: Diagnosis not present

## 2013-11-06 DIAGNOSIS — S79929A Unspecified injury of unspecified thigh, initial encounter: Secondary | ICD-10-CM | POA: Diagnosis not present

## 2013-11-06 DIAGNOSIS — M25559 Pain in unspecified hip: Secondary | ICD-10-CM | POA: Diagnosis not present

## 2013-11-13 DIAGNOSIS — M1610 Unilateral primary osteoarthritis, unspecified hip: Secondary | ICD-10-CM | POA: Diagnosis not present

## 2013-12-28 DIAGNOSIS — M5430 Sciatica, unspecified side: Secondary | ICD-10-CM | POA: Diagnosis not present

## 2014-01-15 ENCOUNTER — Ambulatory Visit: Payer: Medicare Other | Admitting: Nurse Practitioner

## 2014-02-11 ENCOUNTER — Ambulatory Visit (INDEPENDENT_AMBULATORY_CARE_PROVIDER_SITE_OTHER): Payer: Medicare Other | Admitting: Nurse Practitioner

## 2014-02-11 ENCOUNTER — Encounter: Payer: Self-pay | Admitting: Nurse Practitioner

## 2014-02-11 VITALS — BP 147/84 | HR 66 | Temp 98.0°F | Resp 18 | Ht 62.0 in | Wt 152.0 lb

## 2014-02-11 DIAGNOSIS — E785 Hyperlipidemia, unspecified: Secondary | ICD-10-CM | POA: Diagnosis not present

## 2014-02-11 DIAGNOSIS — E079 Disorder of thyroid, unspecified: Secondary | ICD-10-CM

## 2014-02-11 DIAGNOSIS — M5430 Sciatica, unspecified side: Secondary | ICD-10-CM

## 2014-02-11 DIAGNOSIS — E039 Hypothyroidism, unspecified: Secondary | ICD-10-CM

## 2014-02-11 DIAGNOSIS — M5432 Sciatica, left side: Secondary | ICD-10-CM

## 2014-02-11 LAB — COMPREHENSIVE METABOLIC PANEL
ALT: 31 U/L (ref 0–35)
AST: 26 U/L (ref 0–37)
Albumin: 4.1 g/dL (ref 3.5–5.2)
Alkaline Phosphatase: 114 U/L (ref 39–117)
BUN: 27 mg/dL — ABNORMAL HIGH (ref 6–23)
CO2: 26 meq/L (ref 19–32)
CREATININE: 0.7 mg/dL (ref 0.4–1.2)
Calcium: 9.2 mg/dL (ref 8.4–10.5)
Chloride: 107 mEq/L (ref 96–112)
GFR: 86.61 mL/min (ref >60.00)
Glucose, Bld: 83 mg/dL (ref 70–99)
Potassium: 4.3 mEq/L (ref 3.5–5.1)
SODIUM: 140 meq/L (ref 135–145)
TOTAL PROTEIN: 6.8 g/dL (ref 6.0–8.3)
Total Bilirubin: 0.5 mg/dL (ref 0.2–1.2)

## 2014-02-11 LAB — LIPID PANEL
CHOL/HDL RATIO: 3
Cholesterol: 150 mg/dL (ref 0–200)
HDL: 45.6 mg/dL (ref >39.00)
LDL Cholesterol: 70 mg/dL (ref 0–99)
Triglycerides: 173 mg/dL — ABNORMAL HIGH (ref 0.0–149.0)
VLDL: 34.6 mg/dL (ref 0.0–40.0)

## 2014-02-11 LAB — TSH: TSH: 3.13 u[IU]/mL (ref 0.35–4.50)

## 2014-02-11 MED ORDER — PREDNISONE 10 MG PO TABS: ORAL_TABLET | ORAL | Status: DC

## 2014-02-11 MED ORDER — METHYLPREDNISOLONE ACETATE 40 MG/ML IJ SUSP
40.0000 mg | Freq: Once | INTRAMUSCULAR | Status: AC
  Administered 2014-02-11: 40 mg via INTRAMUSCULAR

## 2014-02-11 NOTE — Assessment & Plan Note (Signed)
Hx of fall 10/2013.  Evaluated by urgent care & orthopedist in [place]: Steroids, muscle relaxer given w/no relief. MRI was recommended if persistent pain. Pt wanted to wait to f/u once returned to [place]. PE: Neg straight leg raise, equal LE strength, Tender at R SI joint & over L greater trocanter. Ref to spine & scoliosis specialists.

## 2014-02-11 NOTE — Progress Notes (Signed)
Subjective:    Molly Fleming is a 78 y.o. female who presents for evaluation of persistent low back pain since fall 10/2013. Pt was evaluated at [facility] by orthopedist in [place]. Pt reports she strained R groin muscle, which is now better. Initially, she had low back pain w/radiation in to R hip. That has resolved. Now she has low back that radiates into L buttock. Pain is so severe that she uses walker at home. Today she presents using cane. Treatments to date include muscle relaxer, steroids, and acetaminophen. Only acetaminophen has been helpful. Of note, she took bisphosphonates for 11 years. She stopped taking them 5ya. I will check labs for thyroid today. She denies symptoms of hyper or hypothyroidism. Ms. Cordner & her husband have just returned from their winter stay in [place].   The following portions of the patient's history were reviewed and updated as appropriate: allergies, current medications, past medical history, past social history, past surgical history and problem list.  Review of Systems Pertinent items are noted in HPI.    Objective:   Inspection and palpation: kyphosis noted mild. Muscle tone and ROM exam: antalgic gait. Straight leg raise: negative at 45 degrees bilaterally. Neurological: nml & equal strength LE.    Assessment:   1. Unspecified hypothyroidism - Comprehensive metabolic panel - TSH  2. Back pain with left-sided sciatica Fall 10/2013  - Ambulatory referral to Spine Surgery - methylPREDNISolone acetate (DEPO-MEDROL) injection 40 mg; Inject 1 mL (40 mg total) into the muscle once. - predniSONE (DELTASONE) 10 MG tablet; Starting tomorrow, Take 4Tpo qam X 2d, then 3T po qam X 3d, then 2T po qd X 3d, then 1T po qam X 3d.  Dispense: 26 tablet; Refill: 0  3. Other and unspecified hyperlipidemia - Comprehensive metabolic panel - Lipid panel - methylPREDNISolone acetate (DEPO-MEDROL) injection 40 mg; Inject 1 mL (40 mg total) into the muscle once.     Plan:    See problem list for complete A&P

## 2014-02-11 NOTE — Assessment & Plan Note (Signed)
Takes 10 mg atorvastatin qd. Hx stroke. Lipids, CMET today Continue med.

## 2014-02-11 NOTE — Patient Instructions (Addendum)
My office will call with lab results. Start prednisone tomorrow morning. Take once daily, early in day so it does not interfere with sleep. Please see back specialist. Daily back stretches. Hold position for 10 sec., repeat 5 times, do twice daily.  Back Exercises These exercises may help you when beginning to rehabilitate your injury. Your symptoms may resolve with or without further involvement from your physician, physical therapist or athletic trainer. While completing these exercises, remember:   Restoring tissue flexibility helps normal motion to return to the joints. This allows healthier, less painful movement and activity.  An effective stretch should be held for at least 30 seconds.  A stretch should never be painful. You should only feel a gentle lengthening or release in the stretched tissue.  RANGE OF MOTION- Quadruped, Neutral Spine   Assume a hands and knees position on a firm surface. Keep your hands under your shoulders and your knees under your hips. You may place padding under your knees for comfort.  Drop your head and point your tail bone toward the ground below you. This will round out your low back like an angry cat. Hold this position for __________ seconds.  Slowly lift your head and release your tail bone so that your back sags into a large arch, like an old horse.  Hold this position for __________ seconds.  Repeat this until you feel limber in your low back.  Now, find your "sweet spot." This will be the most comfortable position somewhere between the two previous positions. This is your neutral spine. Once you have found this position, tense your stomach muscles to support your low back.  Hold this position for __________ seconds. Repeat __________ times. Complete this exercise __________ times per day.  STRETCH  Flexion, Single Knee to Chest   Lie on a firm bed or floor with both legs extended in front of you.  Keeping one leg in contact with the floor,  bring your opposite knee to your chest. Hold your leg in place by either grabbing behind your thigh or at your knee.  Pull until you feel a gentle stretch in your low back. Hold __________ seconds.  Slowly release your grasp and repeat the exercise with the opposite side. Repeat __________ times. Complete this exercise __________ times per day.  STRETCH - Hamstrings, Standing  Stand or sit and extend your right / left leg, placing your foot on a chair or foot stool  Keeping a slight arch in your low back and your hips straight forward.  Lead with your chest and lean forward at the waist until you feel a gentle stretch in the back of your right / left knee or thigh. (When done correctly, this exercise requires leaning only a small distance.)  Hold this position for __________ seconds. Repeat __________ times. Complete this stretch __________ times per day. STRENGTHENING  Deep Abdominals, Pelvic Tilt   Lie on a firm bed or floor. Keeping your legs in front of you, bend your knees so they are both pointed toward the ceiling and your feet are flat on the floor.  Tense your lower abdominal muscles to press your low back into the floor. This motion will rotate your pelvis so that your tail bone is scooping upwards rather than pointing at your feet or into the floor.  With a gentle tension and even breathing, hold this position for __________ seconds. Repeat __________ times. Complete this exercise __________ times per day.  STRENGTHENING  Quadruped, Opposite UE/LE Lift  Assume a hands and knees position on a firm surface. Keep your hands under your shoulders and your knees under your hips. You may place padding under your knees for comfort.  Find your neutral spine and gently tense your abdominal muscles so that you can maintain this position. Your shoulders and hips should form a rectangle that is parallel with the floor and is not twisted.  Keeping your trunk steady, lift your right hand  no higher than your shoulder and then your left leg no higher than your hip. Make sure you are not holding your breath. Hold this position __________ seconds.  Continuing to keep your abdominal muscles tense and your back steady, slowly return to your starting position. Repeat with the opposite arm and leg. Repeat __________ times. Complete this exercise __________ times per day. Document Released: 10/01/2005 Document Revised: 12/06/2011 Document Reviewed: 12/26/2008 [facility] Patient Information 2014 [place], [place].

## 2014-02-11 NOTE — Assessment & Plan Note (Signed)
Takes 75 mcg synthroid, Check TSH today. No symptoms of hyper or hypothyroid.

## 2014-02-12 ENCOUNTER — Telehealth: Payer: Self-pay | Admitting: Nurse Practitioner

## 2014-02-12 DIAGNOSIS — E039 Hypothyroidism, unspecified: Secondary | ICD-10-CM

## 2014-02-12 NOTE — Telephone Encounter (Signed)
Patient stated that she has been taking thyroid medication with food and supplements. Advised pt to stop taking thyroid med with food and supplements. Pt stated that she will start taking med as indicated and will call back to schedule lab appt in 6 weeks.

## 2014-02-12 NOTE — Telephone Encounter (Signed)
pls call pt: Advise TSH a little high. Ask if she is taking thyroid medication with food or with vitamins or calcium supplements. If she is, she needs to stop. She should take it either 1 hr before breakfast or any supplements, or in the evening-2 hrs after eating & not with any supplements.  If she is already doing these things, I will need to increase dose. Either way, she needs to come in for lab in 6 weeks, pls sched lab appt. & give her date & time.

## 2014-02-14 DIAGNOSIS — M4120 Other idiopathic scoliosis, site unspecified: Secondary | ICD-10-CM | POA: Diagnosis not present

## 2014-02-14 DIAGNOSIS — M545 Low back pain, unspecified: Secondary | ICD-10-CM | POA: Diagnosis not present

## 2014-02-14 DIAGNOSIS — M5137 Other intervertebral disc degeneration, lumbosacral region: Secondary | ICD-10-CM | POA: Diagnosis not present

## 2014-02-15 ENCOUNTER — Encounter: Payer: Self-pay | Admitting: Nurse Practitioner

## 2014-02-15 DIAGNOSIS — M25559 Pain in unspecified hip: Secondary | ICD-10-CM | POA: Diagnosis not present

## 2014-02-15 DIAGNOSIS — M545 Low back pain, unspecified: Secondary | ICD-10-CM | POA: Diagnosis not present

## 2014-02-21 DIAGNOSIS — M545 Low back pain, unspecified: Secondary | ICD-10-CM | POA: Diagnosis not present

## 2014-02-21 DIAGNOSIS — M25559 Pain in unspecified hip: Secondary | ICD-10-CM | POA: Diagnosis not present

## 2014-02-26 DIAGNOSIS — M25559 Pain in unspecified hip: Secondary | ICD-10-CM | POA: Diagnosis not present

## 2014-02-26 DIAGNOSIS — M545 Low back pain, unspecified: Secondary | ICD-10-CM | POA: Diagnosis not present

## 2014-02-28 DIAGNOSIS — M25559 Pain in unspecified hip: Secondary | ICD-10-CM | POA: Diagnosis not present

## 2014-02-28 DIAGNOSIS — M545 Low back pain, unspecified: Secondary | ICD-10-CM | POA: Diagnosis not present

## 2014-03-05 DIAGNOSIS — M545 Low back pain, unspecified: Secondary | ICD-10-CM | POA: Diagnosis not present

## 2014-03-05 DIAGNOSIS — M25559 Pain in unspecified hip: Secondary | ICD-10-CM | POA: Diagnosis not present

## 2014-03-06 NOTE — Telephone Encounter (Signed)
Scheduled lab appt for 03/27/2014 at 11:00 am.

## 2014-03-06 NOTE — Telephone Encounter (Signed)
pls call pt: remind she needs lab appt, in 3 weeks to check thyroid.

## 2014-03-07 DIAGNOSIS — M25559 Pain in unspecified hip: Secondary | ICD-10-CM | POA: Diagnosis not present

## 2014-03-07 DIAGNOSIS — M545 Low back pain, unspecified: Secondary | ICD-10-CM | POA: Diagnosis not present

## 2014-03-12 DIAGNOSIS — M545 Low back pain, unspecified: Secondary | ICD-10-CM | POA: Diagnosis not present

## 2014-03-12 DIAGNOSIS — M25559 Pain in unspecified hip: Secondary | ICD-10-CM | POA: Diagnosis not present

## 2014-03-14 DIAGNOSIS — M5137 Other intervertebral disc degeneration, lumbosacral region: Secondary | ICD-10-CM | POA: Diagnosis not present

## 2014-03-14 DIAGNOSIS — M4120 Other idiopathic scoliosis, site unspecified: Secondary | ICD-10-CM | POA: Diagnosis not present

## 2014-03-14 DIAGNOSIS — M545 Low back pain, unspecified: Secondary | ICD-10-CM | POA: Diagnosis not present

## 2014-03-27 ENCOUNTER — Other Ambulatory Visit (INDEPENDENT_AMBULATORY_CARE_PROVIDER_SITE_OTHER): Payer: Medicare Other

## 2014-03-27 ENCOUNTER — Telehealth: Payer: Self-pay | Admitting: Nurse Practitioner

## 2014-03-27 ENCOUNTER — Other Ambulatory Visit: Payer: Self-pay

## 2014-03-27 DIAGNOSIS — E039 Hypothyroidism, unspecified: Secondary | ICD-10-CM

## 2014-03-27 DIAGNOSIS — F418 Other specified anxiety disorders: Secondary | ICD-10-CM

## 2014-03-27 DIAGNOSIS — E785 Hyperlipidemia, unspecified: Secondary | ICD-10-CM

## 2014-03-27 LAB — TSH: TSH: 0.72 u[IU]/mL (ref 0.35–4.50)

## 2014-03-27 MED ORDER — LEVOTHYROXINE SODIUM 75 MCG PO TABS
75.0000 ug | ORAL_TABLET | Freq: Every day | ORAL | Status: DC
Start: 2014-03-27 — End: 2014-03-27

## 2014-03-27 MED ORDER — LEVOTHYROXINE SODIUM 75 MCG PO TABS: 75.0000 ug | ORAL_TABLET | Freq: Every day | ORAL | Status: DC

## 2014-03-27 MED ORDER — ATORVASTATIN CALCIUM 10 MG PO TABS
10.0000 mg | ORAL_TABLET | Freq: Every day | ORAL | Status: DC
Start: 2014-03-27 — End: 2014-12-16

## 2014-03-27 MED ORDER — ATORVASTATIN CALCIUM 10 MG PO TABS: 10.0000 mg | ORAL_TABLET | Freq: Every day | ORAL | Status: DC

## 2014-03-27 MED ORDER — PAROXETINE HCL 10 MG PO TABS: 10.0000 mg | ORAL_TABLET | ORAL | Status: DC

## 2014-03-28 NOTE — Telephone Encounter (Signed)
pls call pt: Advise TSH is within nml range, but has changed a lot from last month. SHe has gone from high normal to low normal. Probably because she is taking medicine properly (1 hour before breakfast). Pls schedule OV in 6 weeks. I will check level again to make sure stable. Ask if she feels well.

## 2014-03-28 NOTE — Telephone Encounter (Signed)
Spoke with pt, advised message from [place]. Pt understood. She states she feels well and will call back to make an appt.

## 2014-04-08 ENCOUNTER — Encounter: Payer: Self-pay | Admitting: Nurse Practitioner

## 2014-05-09 ENCOUNTER — Telehealth: Payer: Self-pay | Admitting: Nurse Practitioner

## 2014-06-17 NOTE — Telephone Encounter (Signed)
Error

## 2014-08-15 DIAGNOSIS — Z23 Encounter for immunization: Secondary | ICD-10-CM | POA: Diagnosis not present

## 2014-12-07 DIAGNOSIS — Z882 Allergy status to sulfonamides status: Secondary | ICD-10-CM | POA: Diagnosis not present

## 2014-12-07 DIAGNOSIS — M19041 Primary osteoarthritis, right hand: Secondary | ICD-10-CM | POA: Diagnosis not present

## 2014-12-07 DIAGNOSIS — S0101XA Laceration without foreign body of scalp, initial encounter: Secondary | ICD-10-CM | POA: Diagnosis not present

## 2014-12-07 DIAGNOSIS — S6991XA Unspecified injury of right wrist, hand and finger(s), initial encounter: Secondary | ICD-10-CM | POA: Diagnosis not present

## 2014-12-07 DIAGNOSIS — S0990XA Unspecified injury of head, initial encounter: Secondary | ICD-10-CM | POA: Diagnosis not present

## 2014-12-07 DIAGNOSIS — S0191XA Laceration without foreign body of unspecified part of head, initial encounter: Secondary | ICD-10-CM | POA: Diagnosis not present

## 2014-12-07 DIAGNOSIS — W1830XA Fall on same level, unspecified, initial encounter: Secondary | ICD-10-CM | POA: Diagnosis not present

## 2014-12-07 DIAGNOSIS — Z7982 Long term (current) use of aspirin: Secondary | ICD-10-CM | POA: Diagnosis not present

## 2014-12-07 DIAGNOSIS — S0181XA Laceration without foreign body of other part of head, initial encounter: Secondary | ICD-10-CM | POA: Diagnosis not present

## 2014-12-13 ENCOUNTER — Ambulatory Visit (INDEPENDENT_AMBULATORY_CARE_PROVIDER_SITE_OTHER): Payer: Medicare Other | Admitting: Nurse Practitioner

## 2014-12-13 ENCOUNTER — Encounter: Payer: Self-pay | Admitting: Nurse Practitioner

## 2014-12-13 VITALS — BP 131/83 | HR 64 | Temp 97.7°F | Ht 62.0 in | Wt 162.0 lb

## 2014-12-13 DIAGNOSIS — F329 Major depressive disorder, single episode, unspecified: Secondary | ICD-10-CM

## 2014-12-13 DIAGNOSIS — N390 Urinary tract infection, site not specified: Secondary | ICD-10-CM | POA: Diagnosis not present

## 2014-12-13 DIAGNOSIS — T7840XD Allergy, unspecified, subsequent encounter: Secondary | ICD-10-CM

## 2014-12-13 DIAGNOSIS — Z0000 Encounter for general adult medical examination without abnormal findings: Secondary | ICD-10-CM

## 2014-12-13 DIAGNOSIS — Z23 Encounter for immunization: Secondary | ICD-10-CM | POA: Diagnosis not present

## 2014-12-13 DIAGNOSIS — M1990 Unspecified osteoarthritis, unspecified site: Secondary | ICD-10-CM

## 2014-12-13 DIAGNOSIS — M8580 Other specified disorders of bone density and structure, unspecified site: Secondary | ICD-10-CM | POA: Diagnosis not present

## 2014-12-13 DIAGNOSIS — F32A Depression, unspecified: Secondary | ICD-10-CM

## 2014-12-13 DIAGNOSIS — E785 Hyperlipidemia, unspecified: Secondary | ICD-10-CM | POA: Diagnosis not present

## 2014-12-13 DIAGNOSIS — E538 Deficiency of other specified B group vitamins: Secondary | ICD-10-CM

## 2014-12-13 DIAGNOSIS — E039 Hypothyroidism, unspecified: Secondary | ICD-10-CM

## 2014-12-13 LAB — VITAMIN B12: Vitamin B-12: 565 pg/mL (ref 211–911)

## 2014-12-13 LAB — COMPREHENSIVE METABOLIC PANEL
ALK PHOS: 63 U/L (ref 39–117)
ALT: 35 U/L (ref 0–35)
AST: 28 U/L (ref 0–37)
Albumin: 4.6 g/dL (ref 3.5–5.2)
BUN: 18 mg/dL (ref 6–23)
CO2: 30 mEq/L (ref 19–32)
CREATININE: 0.84 mg/dL (ref 0.40–1.20)
Calcium: 9.3 mg/dL (ref 8.4–10.5)
Chloride: 105 mEq/L (ref 96–112)
GFR: 70.02 mL/min (ref >60.00)
GLUCOSE: 86 mg/dL (ref 70–99)
Potassium: 4.6 mEq/L (ref 3.5–5.1)
Sodium: 140 mEq/L (ref 135–145)
TOTAL PROTEIN: 7.4 g/dL (ref 6.0–8.3)
Total Bilirubin: 0.4 mg/dL (ref 0.2–1.2)

## 2014-12-13 LAB — CBC WITH DIFFERENTIAL/PLATELET
BASOS ABS: 0 10*3/uL (ref 0.0–0.1)
Basophils Relative: 0.4 % (ref 0.0–3.0)
EOS PCT: 3.1 % (ref 0.0–5.0)
Eosinophils Absolute: 0.2 10*3/uL (ref 0.0–0.7)
HEMATOCRIT: 40.7 % (ref 36.0–46.0)
Hemoglobin: 13.6 g/dL (ref 12.0–15.0)
LYMPHS ABS: 2.4 10*3/uL (ref 0.7–4.0)
Lymphocytes Relative: 39.5 % (ref 12.0–46.0)
MCHC: 33.5 g/dL (ref 30.0–36.0)
MCV: 89.2 fl (ref 78.0–100.0)
MONO ABS: 0.4 10*3/uL (ref 0.1–1.0)
Monocytes Relative: 7.2 % (ref 3.0–12.0)
NEUTROS PCT: 49.8 % (ref 43.0–77.0)
Neutro Abs: 3 10*3/uL (ref 1.4–7.7)
PLATELETS: 251 10*3/uL (ref 150.0–400.0)
RBC: 4.56 Mil/uL (ref 3.87–5.11)
RDW: 13.8 % (ref 11.5–15.5)
WBC: 6 10*3/uL (ref 4.0–10.5)

## 2014-12-13 LAB — LIPID PANEL
Cholesterol: 145 mg/dL (ref 0–200)
HDL: 61.4 mg/dL (ref >39.00)
LDL CALC: 58 mg/dL (ref 0–99)
NonHDL: 83.6
TRIGLYCERIDES: 129 mg/dL (ref 0.0–149.0)
Total CHOL/HDL Ratio: 2
VLDL: 25.8 mg/dL (ref 0.0–40.0)

## 2014-12-13 LAB — TSH: TSH: 1.14 u[IU]/mL (ref 0.35–4.50)

## 2014-12-13 LAB — VITAMIN D 25 HYDROXY (VIT D DEFICIENCY, FRACTURES): VITD: 14.21 ng/mL — AB (ref 30.00–100.00)

## 2014-12-13 MED ORDER — CETIRIZINE HCL 10 MG PO TABS: 10.0000 mg | ORAL_TABLET | Freq: Every day | ORAL | Status: DC | PRN

## 2014-12-13 MED ORDER — CIPROFLOXACIN HCL 500 MG PO TABS: 500.0000 mg | ORAL_TABLET | ORAL | Status: DC | PRN

## 2014-12-13 MED ORDER — PAROXETINE HCL 10 MG PO TABS: 10.0000 mg | ORAL_TABLET | ORAL | Status: DC

## 2014-12-13 NOTE — Assessment & Plan Note (Signed)
Pt wishes to continue. Symptoms well controlled.

## 2014-12-13 NOTE — Progress Notes (Signed)
Pre visit review using our clinic review tool, if applicable. No additional management support is needed unless otherwise documented below in the visit note. 

## 2014-12-13 NOTE — Assessment & Plan Note (Signed)
Used med 2 days in 1 yr w/resolve if symptoms. She has had fewer infections since started Vit C daily. Will continue PRN script.

## 2014-12-13 NOTE — Assessment & Plan Note (Signed)
cntinue zyrtec Start daily sinus rinses

## 2014-12-13 NOTE — Progress Notes (Addendum)
Subjective:     Molly Fleming is a 79 y.o. female presents for f/u depression, hyperipidemia, frequent UTI, osteopenia, arthritis pain, hypothyroidism and suture removal. Molly Fleming lives in [place] 6 mos out of year & has recently returned. She has not seen provider in [place]. She recently fell while walking on uneven ground, hit head on retaining wall, suffering forehead lac. She was evaluated in ER-head CT nml. Lac was sutured. Depression: symptoms well controlled on low dose paxil. No intol SE. Wishes to continue med. Hyperlipidemia: Eat less refined grains. RF: sedentary. Hx stroke. Taking 10 mg lipitor w/out intol SE.  frequent UTI: fewer episodes since started vit C daily. 2 episodes over winter: Took 2 doses cipro X 1 day & symptoms resolved.  Osteopenia: last Dexa 2012. t score -2.1. Took fosamax for 10 yrs. Not taking vit D. Taking cal supplement 600 mg qd. Encouraged daily intake gree leafy veggies. arthritis pain: hands & knees. Pain helped by 1000 mg tylenol, but not relieved. Discussed increasing OTC meds for pain relief. Discussed heat therapy for hands (rice bowl). Hypothyroidism: last TSH at goal. Denies palpitations, faigue.   The following portions of the patient's history were reviewed and updated as appropriate: allergies, current medications, past medical history, past social history, past surgical history and problem list.  Review of Systems Respiratory: negative for cough Cardiovascular: negative for lower extremity edema and palpitations Gastrointestinal: positive for constipation and diarrhea, negative for reflux symptoms Genitourinary:negative for dysuria Behavioral/Psych: negative for anxiety, depression and sleep disturbance Endocrine: negative for temperature intolerance    Objective:    BP 131/83 mmHg  Pulse 64  Temp(Src) 97.7 F (36.5 C) (Temporal)  Ht 5\' 2"  (1.575 m)  Wt 162 lb (73.483 kg)  BMI 29.62 kg/m2  SpO2 95% BP 131/83 mmHg  Pulse 64  Temp(Src)  97.7 F (36.5 C) (Temporal)  Ht 5\' 2"  (1.575 m)  Wt 162 lb (73.483 kg)  BMI 29.62 kg/m2  SpO2 95% General appearance: alert, cooperative, appears stated age and no distress Head: Normocephalic, without obvious abnormality, atraumatic Eyes: negative findings: lids and lashes normal, conjunctivae and sclerae normal and wearing glasses Neck: no adenopathy, no carotid bruit, supple, symmetrical, trachea midline and thyroid not enlarged, symmetric, no tenderness/mass/nodules Lungs: clear to auscultation bilaterally Heart: regular rate and rhythm, S1, S2 normal, no murmur, click, rub or gallop Abdomen: soft, non-tender; bowel sounds normal; no masses,  no organomegaly Extremities: extremities normal, atraumatic, no cyanosis or edema Skin: well-approximated lac L forehead. see procedure note. Neurologic: Grossly normal    Procedure: suture removal forehead lac: well approximated edges. Appears to have 5 surtures. Scab has encrusted some of sutures. Cleaned sutures & area with alcohol swab. 5 sutures easily removed. Scant bleeding. Pt tolerated well. Assessment:Plan   1. Arthritis See pt instructions. Tylenol 1000 mg tid prn aspercream Heat therapy-rice bowl Consider low dose prednisone if no improvement   2. Osteopenia - Vit D  25 hydroxy (rtn osteoporosis monitoring)  3. Allergic state, subsequent encounter sinus rinse - cetirizine (ZYRTEC) 10 MG tablet; Take 1 tablet (10 mg total) by mouth daily as needed.  Dispense: 90 tablet; Refill: 1  4. Vitamin B 12 deficiency - Vitamin B12  5. Hyperlipidemia Continue lipitor - Urine culture - Comprehensive metabolic panel - Lipid panel  6. Frequent UTI Improved since started daily Vit C - ciprofloxacin (CIPRO) 500 MG tablet; Take 1 tablet (500 mg total) by mouth as needed.  Dispense: 10 tablet; Refill: 0 - Urine culture  7. Hypothyroidism, unspecified hypothyroidism type controlled - TSH - CBC with Differential/Platelet  8. Need  for vaccination with 13-polyvalent pneumococcal conjugate vaccine - Pneumococcal conjugate vaccine 13-valent IM  9. Depression Symptoms controlled - PARoxetine (PAXIL) 10 MG tablet; Take 1 tablet (10 mg total) by mouth every morning.  Dispense: 90 tablet; Refill: 1  10. Preventative health care MMG due 11/'16  F/u 3 weeks-arthritis  Spent 25 Minutes with patient, 50% or more was spent in counseling.

## 2014-12-13 NOTE — Assessment & Plan Note (Signed)
Check vit D level Continue cal supplement 600 mg qd Get 600 mg in foods-green leafy veges

## 2014-12-13 NOTE — Patient Instructions (Signed)
Increase tylenol to 1000 mg twice daily. You may take 3 times daily if needed. Use aspercream 3 times daily. Submerge hands in rice bowl (heat dry rice in microwave for 1 to 2 minutes) as needed for pain.  Continue prescribed medications.  Start Neilmed sinus rinse daily for best allergy management.  See me in 3 weeks to evaluate arthritis pain.

## 2014-12-13 NOTE — Assessment & Plan Note (Signed)
Increase tylenol to 1000 mg bid aspercream tid Dry rice therapy F/u 3 weeks Consider low dose prednisone

## 2014-12-15 LAB — URINE CULTURE: Colony Count: 75000

## 2014-12-16 ENCOUNTER — Telehealth: Payer: Self-pay | Admitting: Nurse Practitioner

## 2014-12-16 DIAGNOSIS — E039 Hypothyroidism, unspecified: Secondary | ICD-10-CM

## 2014-12-16 DIAGNOSIS — E559 Vitamin D deficiency, unspecified: Secondary | ICD-10-CM

## 2014-12-16 DIAGNOSIS — E785 Hyperlipidemia, unspecified: Secondary | ICD-10-CM

## 2014-12-16 MED ORDER — LEVOTHYROXINE SODIUM 75 MCG PO TABS: 75.0000 ug | ORAL_TABLET | Freq: Every day | ORAL | Status: DC

## 2014-12-16 MED ORDER — VITAMIN D3 1.25 MG (50000 UT) PO CAPS: 1.0000 | ORAL_CAPSULE | ORAL | Status: DC

## 2014-12-16 MED ORDER — ATORVASTATIN CALCIUM 10 MG PO TABS: 10.0000 mg | ORAL_TABLET | Freq: Every day | ORAL | Status: DC

## 2014-12-16 NOTE — Telephone Encounter (Signed)
Patient notified of results. Patient expressed understanding. Lab appt scheduled.

## 2014-12-16 NOTE — Telephone Encounter (Signed)
pls call pt: Advise Labs: no changes to thyroid med-I sent in refill. She needs to come in to have checked in 6 mos or before she goes back to [place] in fall. Cholesterol looksgreat-remain on low dose lipitor. I sent in refill. Vitamin D is too low. I sent in script for D3. She should take 1 capsule WEEKLY for 12 weeks, then come in for lab appt only. Pls sched lab appt..-no need to fast.  Urine sample appears contaminated. She should come in to give another sample & instructions should be given -cannot touch inside of lid or container, should not touch container to body, etc. All other labs look great!

## 2014-12-17 ENCOUNTER — Telehealth: Payer: Self-pay

## 2014-12-17 NOTE — Telephone Encounter (Signed)
Called and LMOVM for patient to call back regarding her VM that I received this morning about her medications

## 2014-12-19 NOTE — Telephone Encounter (Signed)
Attempted to call patient again, no answer. Will await a CB

## 2015-01-03 ENCOUNTER — Ambulatory Visit (INDEPENDENT_AMBULATORY_CARE_PROVIDER_SITE_OTHER): Payer: Medicare Other | Admitting: Nurse Practitioner

## 2015-01-03 ENCOUNTER — Encounter: Payer: Self-pay | Admitting: Nurse Practitioner

## 2015-01-03 VITALS — BP 133/74 | HR 69 | Temp 98.0°F | Ht 62.0 in | Wt 163.0 lb

## 2015-01-03 DIAGNOSIS — M79641 Pain in right hand: Secondary | ICD-10-CM | POA: Diagnosis not present

## 2015-01-03 DIAGNOSIS — M79642 Pain in left hand: Secondary | ICD-10-CM | POA: Diagnosis not present

## 2015-01-03 DIAGNOSIS — M25562 Pain in left knee: Secondary | ICD-10-CM

## 2015-01-03 DIAGNOSIS — M25561 Pain in right knee: Secondary | ICD-10-CM | POA: Diagnosis not present

## 2015-01-03 DIAGNOSIS — M25542 Pain in joints of left hand: Principal | ICD-10-CM

## 2015-01-03 DIAGNOSIS — M25541 Pain in joints of right hand: Secondary | ICD-10-CM

## 2015-01-03 MED ORDER — PREDNISONE 5 MG PO TABS: 5.0000 mg | ORAL_TABLET | Freq: Every day | ORAL | Status: DC

## 2015-01-03 NOTE — Progress Notes (Signed)
Pre visit review using our clinic review tool, if applicable. No additional management support is needed unless otherwise documented below in the visit note. 

## 2015-01-03 NOTE — Patient Instructions (Signed)
Start prednisone. Take in mornings.  Start curmarin. Take 2 to 3 capsules daily. Increase tylenol to 3 times daily.  Use heat therapy for hands. You may apply aspercream to fronts, backs, & sides of knees 3 times daily.  See me in 1 month.

## 2015-01-04 DIAGNOSIS — M25541 Pain in joints of right hand: Secondary | ICD-10-CM | POA: Insufficient documentation

## 2015-01-04 DIAGNOSIS — M25542 Pain in joints of left hand: Principal | ICD-10-CM

## 2015-01-04 DIAGNOSIS — M25561 Pain in right knee: Secondary | ICD-10-CM | POA: Insufficient documentation

## 2015-01-04 DIAGNOSIS — M25562 Pain in left knee: Secondary | ICD-10-CM

## 2015-01-04 NOTE — Assessment & Plan Note (Signed)
Increase tylenol to tid Use heat therapy Continue aspercream, use on knees as well Add prednisone 5 mg qd F/u 1 mo

## 2015-01-04 NOTE — Progress Notes (Signed)
Subjective:    Molly Fleming is an 79 y.o. female who presents for f/u of  arthralgias in hands & knees that began several years ago, but has worsened in last year, interfering w/ADL's. Pain is located in bilat fingers & knees. The pain is described as intermittent, aching.  Associated symptoms include: none. The patient has tylenol bid & aspercream tid with mild relief. Related to injury: no. She denies weakness in knees. [place] about 1 mo ago, says not due to knee instability-tripped on uneven ground while working in yard. She does not want to orthopedist or arth specialists such as rheum. The following portions of the patient's history were reviewed and updated as appropriate: allergies, current medications, past medical history, past social history, past surgical history and problem list.   Review of Systems Pertinent items are noted in HPI.    Objective:    BP 133/74 mmHg  Pulse 69  Temp(Src) 98 F (36.7 C) (Oral)  Ht 5\' 2"  (1.575 m)  Wt 163 lb (73.936 kg)  BMI 29.81 kg/m2  SpO2 95% General appearance: alert, cooperative, appears stated age and no distress Head: Normocephalic, without obvious abnormality, atraumatic Eyes: negative findings: lids and lashes normal and conjunctivae and sclerae normal Extremities: enlargement of dip & pip joints bilat hands. No warmth or erythema. Knees- no defromity, warmth, erythema, or effusion. Neurologic: Grossly normal    Assessment:Plan   1. Arthralgia of hands, bilateral Increase tylenol to tid Start curmarin 2-3 capsules qd Avoid NSAIDS due to Hx CVA Continue aspercream & heat therapy - predniSONE (DELTASONE) 5 MG tablet; Take 1 tablet (5 mg total) by mouth daily with breakfast.  Dispense: 30 tablet; Refill: 1 F/u 1 mo

## 2015-02-03 ENCOUNTER — Ambulatory Visit (INDEPENDENT_AMBULATORY_CARE_PROVIDER_SITE_OTHER): Payer: Medicare Other | Admitting: Nurse Practitioner

## 2015-02-03 ENCOUNTER — Encounter: Payer: Self-pay | Admitting: Nurse Practitioner

## 2015-02-03 VITALS — BP 148/85 | HR 67 | Temp 98.1°F | Ht 62.0 in | Wt 163.0 lb

## 2015-02-03 DIAGNOSIS — M1990 Unspecified osteoarthritis, unspecified site: Secondary | ICD-10-CM | POA: Diagnosis not present

## 2015-02-03 LAB — COMPREHENSIVE METABOLIC PANEL
ALK PHOS: 55 U/L (ref 39–117)
ALT: 22 U/L (ref 0–35)
AST: 20 U/L (ref 0–37)
Albumin: 4.3 g/dL (ref 3.5–5.2)
BUN: 22 mg/dL (ref 6–23)
CO2: 30 mEq/L (ref 19–32)
Calcium: 9.7 mg/dL (ref 8.4–10.5)
Chloride: 102 mEq/L (ref 96–112)
Creatinine, Ser: 0.87 mg/dL (ref 0.40–1.20)
GFR: 67.22 mL/min (ref >60.00)
Glucose, Bld: 75 mg/dL (ref 70–99)
POTASSIUM: 4.8 meq/L (ref 3.5–5.1)
SODIUM: 140 meq/L (ref 135–145)
TOTAL PROTEIN: 7 g/dL (ref 6.0–8.3)
Total Bilirubin: 0.4 mg/dL (ref 0.2–1.2)

## 2015-02-03 MED ORDER — PREDNISONE 1 MG PO TABS
4.0000 mg | ORAL_TABLET | Freq: Every day | ORAL | Status: DC
Start: 2015-02-03 — End: 2015-02-17

## 2015-02-03 NOTE — Patient Instructions (Signed)
Continue cumarin 2000 mg qd.  Decrease prednisone to 4 mg daily.  Continue to take tylenol twice daily and use aspercream as needed.  Please return in 2 weeks to evaluate efficacy of lower dose of prednisone.   So glad you are feeling better!

## 2015-02-03 NOTE — Progress Notes (Signed)
Subjective:    Molly Fleming is an 79 y.o. female who presents for f/u hand & knee inflammatory arthritis. She was started on 5 mg prednisone qd 4 weeks ago. She says she feels she "has her life back". She is gardening & performing housework again. She is no longer contemplating selling her home. Pain in hands is much better, still has some pain in R knee. She has decreased tylenol to BID, aspercream to qhs. We discussed long-term use of low dose prednisone. We  Will use lowest dose that acheives therapeutic effect. She will start cumarin qd.  The following portions of the patient's history were reviewed and updated as appropriate: allergies, current medications, past medical history, past social history, past surgical history and problem list.  Review of Systems Pertinent items are noted in HPI.    Objective:    BP 148/85 mmHg  Pulse 67  Temp(Src) 98.1 F (36.7 C) (Oral)  Ht 5\' 2"  (1.575 m)  Wt 163 lb (73.936 kg)  BMI 29.81 kg/m2  SpO2 97% General appearance: alert, cooperative, appears stated age and no distress Head: Normocephalic, without obvious abnormality, atraumatic Eyes: negative findings: lids and lashes normal, conjunctivae and sclerae normal and wearing glasses Extremities: heberden's & bouchards nodes on multiple digits on hands. fist formation slightly limited, but improved since last OV. InabilityKnees cool, no swelling or warmth.    Assessment:Plan   1. Inflammatory osteoarthritis Decrease daily dose to 4 mg from 5 mg Continue tylenol bid, aspercream qhs Start cumarin 2000 mg qd. - predniSONE (DELTASONE) 1 MG tablet; Take 4 tablets (4 mg total) by mouth daily with breakfast.  Dispense: 120 tablet; Refill: 0 - Comprehensive metabolic panel  F/u 2 weeks

## 2015-02-03 NOTE — Progress Notes (Signed)
Pre visit review using our clinic review tool, if applicable. No additional management support is needed unless otherwise documented below in the visit note. 

## 2015-02-04 ENCOUNTER — Telehealth: Payer: Self-pay | Admitting: Nurse Practitioner

## 2015-02-04 NOTE — Telephone Encounter (Signed)
pls call pt: Advise Labs look good!

## 2015-02-04 NOTE — Telephone Encounter (Signed)
LMOVM-identified about lab results. Patient to call back with any questions or concerns. DPR Signed.  

## 2015-02-17 ENCOUNTER — Encounter: Payer: Self-pay | Admitting: Nurse Practitioner

## 2015-02-17 ENCOUNTER — Ambulatory Visit (INDEPENDENT_AMBULATORY_CARE_PROVIDER_SITE_OTHER): Payer: Medicare Other | Admitting: Nurse Practitioner

## 2015-02-17 VITALS — BP 130/80 | HR 64 | Temp 97.8°F | Ht 62.0 in | Wt 164.0 lb

## 2015-02-17 DIAGNOSIS — M1990 Unspecified osteoarthritis, unspecified site: Secondary | ICD-10-CM

## 2015-02-17 DIAGNOSIS — N951 Menopausal and female climacteric states: Secondary | ICD-10-CM | POA: Diagnosis not present

## 2015-02-17 DIAGNOSIS — R232 Flushing: Secondary | ICD-10-CM

## 2015-02-17 MED ORDER — PREDNISONE 1 MG PO TABS: 3.0000 mg | ORAL_TABLET | Freq: Every day | ORAL | Status: DC

## 2015-02-17 NOTE — Progress Notes (Signed)
Pre visit review using our clinic review tool, if applicable. No additional management support is needed unless otherwise documented below in the visit note. 

## 2015-02-17 NOTE — Patient Instructions (Signed)
Decrease prednisone to 3 mg every morning. Continue with tylenol 1000 mg twice daily, aspercream, & curmarin.  Consider adding a daily handful of tart cherries to diet to help with arthritis pain.  Make a goal of taking a daily walk-at least 20 minutes to help with blood pressure and prevent weight gain.  I will see you in 1 month to review response to lower prednisone dose & evaluate blood pressure & get lab work. You will not need to fast.   Nice to see you!  No more falls, please:)

## 2015-02-17 NOTE — Progress Notes (Signed)
Subjective:    Molly Fleming is an 79 y.o. female who presents for f/u of hand & knee oa  that began several years ago. Pain is located in both PIP(s): 1st, 2nd, 3rd and 5th, both DIP(s): 2nd, 3rd and 5th and both knee(s) (r>L). Current pain regimen is prednisone 4 mg qd, tylenol 1000 mg bid, aspercream up to 3 times daily, curmarin 2000 mg qd. She reports 0-2/10 PAIN FROM 8/10 PRIOR TO STARTING PREDNiSONE. She started with 5 mg qd X 3 wks, then 4 mg X 2wks with no increase in pain. She reports increase of hot flashes from occasional to several times daily. She denies intol SE such as abd pain, indigestion, palpitations, insomnia, jitteriness, mood changes. She gained 1 lb in last 2 weeks & 10 lbs in last  Yr.: 10 lb wt gain due to inactivity due to knee pain.  She is much more actv since starting prednisone, as she rarely has pain. She has Hx of osetoporosis, so goal is to use smallest amt of prednisone for shortest duration.  She had fall last week-tripped on buried fence post while cutting limbs. She reports no injuries.  The following portions of the patient's history were reviewed and updated as appropriate: allergies, current medications, past medical history, past social history, past surgical history and problem list.  Review of Systems Pertinent items are noted in HPI.    Objective:    BP 130/80 mmHg  Pulse 64  Temp(Src) 97.8 F (36.6 C) (Oral)  Ht 5\' 2"  (1.575 m)  Wt 164 lb (74.39 kg)  BMI 29.99 kg/m2  SpO2 97% General appearance: alert, cooperative, appears stated age and no distress Head: Normocephalic, without obvious abnormality, atraumatic Eyes: negative findings: lids and lashes normal, conjunctivae and sclerae normal and wearing glasses Extremities: R hand: heberden's nodes 2, 3, 5 digits, tender CMC joint. L hand: heberden's nodes 2,3,5 digits less prominent than r. Bilat Knees: R medial tenderness, o/w no warmth, swelling, effusion. FROM, bilat nontender  crepitus. Neurologic: Grossly normal    Assessment:Plan  1. Inflammatory osteoarthritis Hands, knees Decrease to 3 mg from 4 mg qd - predniSONE (DELTASONE) 1 MG tablet; Take 3 tablets (3 mg total) by mouth daily with breakfast.  Dispense: 90 tablet; Refill: 0 Continue tylenol bid, aspercream tid, curmarin 2000 mg qd Add handful tart cherries qd  2. Hot flashes Medication related-prednisone? Hx of hysterectomy, postmenopausal Monitor F/u 1 mo-arthritis pain, hot flashes, vit d level, cbc, cmet

## 2015-03-04 ENCOUNTER — Other Ambulatory Visit: Payer: Medicare Other

## 2015-03-20 ENCOUNTER — Ambulatory Visit (INDEPENDENT_AMBULATORY_CARE_PROVIDER_SITE_OTHER): Payer: Medicare Other | Admitting: Nurse Practitioner

## 2015-03-20 ENCOUNTER — Telehealth: Payer: Self-pay | Admitting: Nurse Practitioner

## 2015-03-20 VITALS — BP 125/84 | HR 69 | Temp 97.7°F | Resp 16 | Ht 62.0 in | Wt 155.0 lb

## 2015-03-20 DIAGNOSIS — E559 Vitamin D deficiency, unspecified: Secondary | ICD-10-CM | POA: Diagnosis not present

## 2015-03-20 DIAGNOSIS — M1990 Unspecified osteoarthritis, unspecified site: Secondary | ICD-10-CM

## 2015-03-20 DIAGNOSIS — E538 Deficiency of other specified B group vitamins: Secondary | ICD-10-CM

## 2015-03-20 DIAGNOSIS — Z0000 Encounter for general adult medical examination without abnormal findings: Secondary | ICD-10-CM

## 2015-03-20 DIAGNOSIS — E039 Hypothyroidism, unspecified: Secondary | ICD-10-CM

## 2015-03-20 LAB — COMPREHENSIVE METABOLIC PANEL
ALBUMIN: 4.7 g/dL (ref 3.5–5.2)
ALT: 19 U/L (ref 0–35)
AST: 20 U/L (ref 0–37)
Alkaline Phosphatase: 46 U/L (ref 39–117)
BUN: 24 mg/dL — ABNORMAL HIGH (ref 6–23)
CALCIUM: 9.8 mg/dL (ref 8.4–10.5)
CHLORIDE: 105 meq/L (ref 96–112)
CO2: 27 mEq/L (ref 19–32)
Creatinine, Ser: 1.1 mg/dL (ref 0.40–1.20)
GFR: 51.26 mL/min — AB (ref >60.00)
GLUCOSE: 95 mg/dL (ref 70–99)
POTASSIUM: 4.8 meq/L (ref 3.5–5.1)
Sodium: 139 mEq/L (ref 135–145)
TOTAL PROTEIN: 7.4 g/dL (ref 6.0–8.3)
Total Bilirubin: 0.4 mg/dL (ref 0.2–1.2)

## 2015-03-20 LAB — CBC
HCT: 41.5 % (ref 36.0–46.0)
Hemoglobin: 13.7 g/dL (ref 12.0–15.0)
MCHC: 33 g/dL (ref 30.0–36.0)
MCV: 92.3 fl (ref 78.0–100.0)
Platelets: 241 10*3/uL (ref 150.0–400.0)
RBC: 4.5 Mil/uL (ref 3.87–5.11)
RDW: 14.9 % (ref 11.5–15.5)
WBC: 8.2 10*3/uL (ref 4.0–10.5)

## 2015-03-20 LAB — VITAMIN D 25 HYDROXY (VIT D DEFICIENCY, FRACTURES): VITD: 40.17 ng/mL (ref 30.00–100.00)

## 2015-03-20 LAB — TSH: TSH: 0.64 u[IU]/mL (ref 0.35–4.50)

## 2015-03-20 LAB — VITAMIN B12

## 2015-03-20 MED ORDER — PREDNISONE 1 MG PO TABS: 2.0000 mg | ORAL_TABLET | Freq: Every day | ORAL | Status: DC

## 2015-03-20 NOTE — Patient Instructions (Signed)
Please return stool test. Please get mammogram. Decrease prednisone to 2 mg daily. Let us know in 2-3 weeks if this is working to decrease arthritis pain.  My office will call with lab results.  Great job with walking & diet changes!  It has been a pleasure to know and care for you!

## 2015-03-20 NOTE — Telephone Encounter (Signed)
pls call pt: Advise All labs look good. Start maintenance dose vit D 2000 iu D3 daily.

## 2015-03-20 NOTE — Progress Notes (Signed)
Pre visit review using our clinic review tool, if applicable. No additional management support is needed unless otherwise documented below in the visit note. 

## 2015-03-21 ENCOUNTER — Encounter: Payer: Self-pay | Admitting: Nurse Practitioner

## 2015-03-21 NOTE — Telephone Encounter (Signed)
Left detailed message on cell vm, okay per DPR.  

## 2015-03-21 NOTE — Progress Notes (Signed)
Subjective:     Molly Fleming is a 79 y.o. female presents for f/u of inflammatory arthritis in hands & knees that interferes with ADLS & QOL. SHe & husband were considering selling their property due to her frustration of not able to maintain property due to pain. She was getting minimal relief on NSAID & tylenol. She had dramatic improvement after starting low dose prednisone 5 mg qd. We have titrated down to 3 mg qd. She has had some increase in pain in R knee, but tolerable-not slowing her down. She is continuing to do things she wants to do. I discussed ref to ortho to eval knee-hyalgan inj may help. She declines, says not bad enough yet. Discussed potential SE of LT predisone use, although her dose is very low. She will try 2 md qd. She is walking 5-6 days/week, has cut out sugar & lost 9 lbs in 4 weeks! She says she feels great! She is planning to get colonscopy this summer before returning to [facility].    The following portions of the patient's history were reviewed and updated as appropriate: allergies, current medications, past medical history, past social history, past surgical history and problem list.  Review of Systems Pertinent items are noted in HPI.    Objective:    BP 125/84 mmHg  Pulse 69  Temp(Src) 97.7 F (36.5 C) (Temporal)  Resp 16  Ht 5' 2"  (1.575 m)  Wt 155 lb (70.308 kg)  BMI 28.34 kg/m2  SpO2 95% BP 125/84 mmHg  Pulse 69  Temp(Src) 97.7 F (36.5 C) (Temporal)  Resp 16  Ht 5' 2"  (1.575 m)  Wt 155 lb (70.308 kg)  BMI 28.34 kg/m2  SpO2 95% General appearance: alert, cooperative, appears stated age and no distress Eyes: negative findings: lids and lashes normal and conjunctivae and sclerae normal Neck: no adenopathy, no carotid bruit, supple, symmetrical, trachea midline and thyroid not enlarged, symmetric, no tenderness/mass/nodules Lungs: clear to auscultation bilaterally Heart: regular rate and rhythm, S1, S2 normal, no murmur, click, rub or  gallop Extremities: extremities normal, atraumatic, no cyanosis or edema Neurologic: Grossly normal MSK: slight warmth lateral R knee, slightly effusion. L knee cool. No effusion.      Assessment:Plan     1. Hypothyroidism, unspecified hypothyroidism type Monitor Will refill med when labs ret - CBC - Comprehensive metabolic panel - TSH  2. Preventative health care Keep walking & eating low sugar diet Get colonoscopy this summer - Fecal occult blood, imunochemical  3. Vitamin B12 deficiency - Vitamin B12  4. Vitamin D deficiency Just completed 3 mo prescription - Vit D  25 hydroxy (rtn osteoporosis monitoring)  5. Inflammatory osteoarthritis Decrease to 2 mg qd Let me know if increase in pain in 2 weeks - predniSONE (DELTASONE) 1 MG tablet; Take 2 tablets (2 mg total) by mouth daily with breakfast.  Dispense: 60 tablet; Refill: 0  F/u 1 mo

## 2015-04-04 ENCOUNTER — Other Ambulatory Visit: Payer: Self-pay | Admitting: Family Medicine

## 2015-04-04 ENCOUNTER — Other Ambulatory Visit (INDEPENDENT_AMBULATORY_CARE_PROVIDER_SITE_OTHER): Payer: Medicare Other

## 2015-04-04 DIAGNOSIS — Z1211 Encounter for screening for malignant neoplasm of colon: Secondary | ICD-10-CM

## 2015-04-04 DIAGNOSIS — Z0000 Encounter for general adult medical examination without abnormal findings: Secondary | ICD-10-CM

## 2015-04-04 LAB — FECAL OCCULT BLOOD, IMMUNOCHEMICAL: FECAL OCCULT BLD: NEGATIVE

## 2015-04-07 ENCOUNTER — Telehealth: Payer: Self-pay | Admitting: Nurse Practitioner

## 2015-04-07 NOTE — Telephone Encounter (Signed)
Called patient, advised of negative stool results, repeat in one year.

## 2015-04-07 NOTE — Telephone Encounter (Signed)
pls call pt: Advise Stool test negative. Repeat in 1 yr.

## 2015-08-27 ENCOUNTER — Telehealth: Payer: Self-pay | Admitting: Nurse Practitioner

## 2015-08-27 NOTE — Telephone Encounter (Signed)
Pt is traveling to [place] for 28mns. She needs the following prescriptions that will have 6 mn refills.  Paroxetine  10mg  L Thyroxime  75mg  Atorvastin  10 mg Cipro 500 mg - this is for when she travels she always gets UTI's. She only needs 2 weeks worth.  She will schedule an appt with Dr. Raliegh Ip upon return from [place].

## 2015-08-27 NOTE — Telephone Encounter (Signed)
Patient advised to make appointment prior to leaving for [place].  Pt has never even been evaluated by Dr. Raoul Pitch and we would not be able to fill 6 month supply without pt at least being established by physician.

## 2015-08-29 ENCOUNTER — Ambulatory Visit (INDEPENDENT_AMBULATORY_CARE_PROVIDER_SITE_OTHER): Payer: Medicare Other | Admitting: Family Medicine

## 2015-08-29 ENCOUNTER — Encounter: Payer: Self-pay | Admitting: Family Medicine

## 2015-08-29 VITALS — BP 126/68 | HR 89 | Temp 97.7°F | Resp 16 | Ht 62.0 in | Wt 152.0 lb

## 2015-08-29 DIAGNOSIS — E039 Hypothyroidism, unspecified: Secondary | ICD-10-CM

## 2015-08-29 DIAGNOSIS — E785 Hyperlipidemia, unspecified: Secondary | ICD-10-CM

## 2015-08-29 DIAGNOSIS — Z23 Encounter for immunization: Secondary | ICD-10-CM | POA: Diagnosis not present

## 2015-08-29 DIAGNOSIS — F329 Major depressive disorder, single episode, unspecified: Secondary | ICD-10-CM

## 2015-08-29 DIAGNOSIS — F32A Depression, unspecified: Secondary | ICD-10-CM

## 2015-08-29 DIAGNOSIS — N390 Urinary tract infection, site not specified: Secondary | ICD-10-CM

## 2015-08-29 DIAGNOSIS — T7840XD Allergy, unspecified, subsequent encounter: Secondary | ICD-10-CM

## 2015-08-29 DIAGNOSIS — T7840XA Allergy, unspecified, initial encounter: Secondary | ICD-10-CM | POA: Insufficient documentation

## 2015-08-29 DIAGNOSIS — M8580 Other specified disorders of bone density and structure, unspecified site: Secondary | ICD-10-CM

## 2015-08-29 MED ORDER — LEVOTHYROXINE SODIUM 75 MCG PO TABS: 75.0000 ug | ORAL_TABLET | Freq: Every day | ORAL | Status: DC

## 2015-08-29 MED ORDER — CIPROFLOXACIN HCL 500 MG PO TABS: 500.0000 mg | ORAL_TABLET | ORAL | Status: DC | PRN

## 2015-08-29 MED ORDER — CETIRIZINE HCL 10 MG PO TABS: 10.0000 mg | ORAL_TABLET | Freq: Every day | ORAL | Status: DC | PRN

## 2015-08-29 MED ORDER — ATORVASTATIN CALCIUM 10 MG PO TABS: 10.0000 mg | ORAL_TABLET | Freq: Every day | ORAL | Status: DC

## 2015-08-29 MED ORDER — PAROXETINE HCL 10 MG PO TABS: 10.0000 mg | ORAL_TABLET | ORAL | Status: DC

## 2015-08-29 NOTE — Progress Notes (Signed)
Subjective:    Patient ID: Molly Fleming, female    DOB: [birth year], 79 y.o.   MRN: [id number]  HPI CC: follow-up on chronic conditions  Hyperlipidemia/h/o of stroke: Patient reports compliance with daily 325 aspirin, Lipitor 10 mg Monday through Friday. Patient denies any negative side effects to statin use. She attempts to exercise and watch her diet closely. Last cholesterol panel March 2016 total cholesterol 135, triglycerides 129, HDL 61, LDL 58.  Hypothyroidism, unspecified hypothyroidism type: Last TSH 0.646 months ago, stable. Patient in need of refills today. She denies any fatigue, constipation/diarrhea, palpitations, flushing or dry skin.  Depression: Patient reports compliance with Paxil 10 mg daily. She denies any negative side effects to Paxil use. She reports feeling much better on Paxil, and he needs refills today. She feels her depression is stable.  Allergic state, subsequent encounter: Patient states that when she goes to [place] her allergies worsen, she believes is from the mango trees. She is leaving for [place] in a few weeks will be staying there for the next 5 months. She has taken Zyrtec in the past with resolution of her symptoms.  Frequent UTI: Patient states she has frequent urinary tract infections secondary to a prolapsed bladder. She has been prescribed Cipro by prior providers to use when necessary for her urinary tract infections, especially when traveling. She states when she travels she usually always has a urinary tract infection.  Osteopenia: Patient continues to supplement with vitamin D 2000 international units daily. Last DEXA scan 2012, osteopenia. Patient denies any recent fractures or falls.   Health: flu shot. Pna 11/2015. Past Medical History  Diagnosis Date  . Osteoporosis     started taking meds at age 84 then just went off recently- due to conflict she has heard  . Hyperlipidemia   . Allergy     seasonal  . Mumps as a child  . Measles    . Whooping cough as a child  . Cystic disease of breast   . Arthritis     right hand supplement  . Depression   . Thyroid disease     s/p thyroidectomy, goiter  . Actinic keratosis 08/04/2011  . Preventative health care 08/04/2011  . Frequent UTI 08/04/2011  . Headache(784.0)   . Left ankle sprain 05/22/2012  . Cervical cancer screening 07/10/2012  . Skin lesion of left leg 07/10/2012   Allergies  Allergen Reactions  . Sulfa Antibiotics Other (See Comments)    unknown   Past Surgical History  Procedure Laterality Date  . Removed part of thyroid  2002  . Breast surgery      breast biopsies, b/l benign  . Abdominal hysterectomy  1982    partial still has ovaries, uterine prolapse and menorraghia  . Tee without cardioversion  09/16/2011    Procedure: TRANSESOPHAGEAL ECHOCARDIOGRAM (TEE);  Surgeon: Peter Martinique, MD;  Location: [facility];  Service: Cardiovascular;  Laterality: N/A;  . Appendectomy      Family History  Problem Relation Age of Onset  . Adopted: Yes  . Cancer Mother   . Heart attack Father   . Heart disease Father   . Cancer Brother     colon  . Anxiety disorder Son    Social History   Social History  . Marital Status: Married    Spouse Name: N/A  . Number of Children: 3  . Years of Education: N/A   Occupational History  . retired    Social History Main Topics  .  Smoking status: Never Smoker   . Smokeless tobacco: Never Used  . Alcohol Use: No  . Drug Use: No  . Sexual Activity:    Partners: Male    Birth Control/ Protection: Post-menopausal   Other Topics Concern  . Not on file   Social History Narrative   Ms. Keilman is retired. She lives with her husband. They reside in [place] and [place] part-year.     Review of Systems Negative, with the exception of above mentioned in HPI     Objective:   Physical Exam BP 159/89 mmHg  Pulse 89  Temp(Src) 97.7 F (36.5 C) (Oral)  Resp 16  Ht 5\' 2"  (1.575 m)  Wt 152 lb (68.947 kg)  BMI 27.79  kg/m2 Gen: Afebrile. No acute distress. Nontoxic in appearance, well-developed, well-nourished Caucasian female. Pleasant. HENT: AT. [place]. MMM. Bilateral nares without erythema or swelling. Throat without erythema or exudates. No cough on exam, no hoarseness on exam. Eyes:Pupils Equal Round Reactive to light, Extraocular movements intact,  Conjunctiva without redness, discharge or icterus. Neck/lymp/endocrine: Supple, no lymphadenopathy, no thyromegaly CV: RRR, no edema, +2/4 P posterior tibialis pulses Chest: CTAB, no wheeze or crackles Abd: Soft. Round. NTND. BS present. No Masses palpated.  Skin: No rashes, purpura or petechiae.  Neuro: Normal gait. PERLA. EOMi. Alert. Oriented x3 Psych: Normal affect, dress and demeanor. Normal speech. Normal thought content and judgment..      Assessment & Plan:  1. Need for immunization - Flu shot administered today  2. Hyperlipidemia/h/o of stroke - continue statin and daily ASA - atorvastatin (LIPITOR) 10 MG tablet; Take 1 tablet (10 mg total) by mouth daily.  Dispense: 90 tablet; Refill: 3  3. Hypothyroidism, unspecified hypothyroidism type - Last TSH normal (0.64) 6 months ago. Has been stable.  - Monitor yearly. Refills sent today.  - levothyroxine (SYNTHROID, LEVOTHROID) 75 MCG tablet; Take 1 tablet (75 mcg total) by mouth daily.  Dispense: 90 tablet; Refill: 1  4. Depression - continue paxil daily - PARoxetine (PAXIL) 10 MG tablet; Take 1 tablet (10 mg total) by mouth every morning.  Dispense: 90 tablet; Refill: 1  5. Allergic state, subsequent encounter - allergies worse in [place].  - cetirizine (ZYRTEC) 10 MG tablet; Take 1 tablet (10 mg total) by mouth daily as needed.  Dispense: 90 tablet; Refill: 1  6. Frequent UTI - Discussed abx resistance and different bacteria/sensitivities with pt today. Pt does not desire daily prophylaxis and states she really only needs to take abx when traveling.  - ciprofloxacin (CIPRO) 500 MG tablet;  Take 1 tablet (500 mg total) by mouth as needed.  Dispense: 20 tablet; Refill: 0  7. Osteopenia: - Continue 2000 iu Vit D daily. - Monitor every 2 years with BD scan F/u 6 months

## 2015-08-29 NOTE — Progress Notes (Signed)
Pre visit review using our clinic review tool, if applicable. No additional management support is needed unless otherwise documented below in the visit note. 

## 2015-08-29 NOTE — Patient Instructions (Signed)
It was a pleasure meeting you today.  Hope you have a wonderful time in [place].  I will see you for your Office visit when you return in May.

## 2016-01-20 DIAGNOSIS — M4125 Other idiopathic scoliosis, thoracolumbar region: Secondary | ICD-10-CM | POA: Diagnosis not present

## 2016-01-20 DIAGNOSIS — M4807 Spinal stenosis, lumbosacral region: Secondary | ICD-10-CM | POA: Diagnosis not present

## 2016-01-20 DIAGNOSIS — M1612 Unilateral primary osteoarthritis, left hip: Secondary | ICD-10-CM | POA: Diagnosis not present

## 2016-01-21 ENCOUNTER — Other Ambulatory Visit: Payer: Self-pay | Admitting: Orthopedic Surgery

## 2016-01-21 DIAGNOSIS — M4125 Other idiopathic scoliosis, thoracolumbar region: Secondary | ICD-10-CM

## 2016-01-29 ENCOUNTER — Ambulatory Visit
Admission: RE | Admit: 2016-01-29 | Discharge: 2016-01-29 | Disposition: A | Discharge status: Home / Self Care | Payer: Medicare Other | Source: Ambulatory Visit | Attending: Orthopedic Surgery | Admitting: Orthopedic Surgery

## 2016-01-29 DIAGNOSIS — M5126 Other intervertebral disc displacement, lumbar region: Secondary | ICD-10-CM | POA: Diagnosis not present

## 2016-01-29 DIAGNOSIS — M4125 Other idiopathic scoliosis, thoracolumbar region: Secondary | ICD-10-CM

## 2016-01-29 MED ORDER — IOPAMIDOL (ISOVUE-M 200) INJECTION 41%
15.0000 mL | Freq: Once | INTRAMUSCULAR | Status: AC
  Administered 2016-01-29: 15 mL via INTRATHECAL

## 2016-01-29 MED ORDER — DIAZEPAM 5 MG PO TABS
5.0000 mg | ORAL_TABLET | Freq: Once | ORAL | Status: AC
  Administered 2016-01-29: 5 mg via ORAL

## 2016-01-29 NOTE — Discharge Instructions (Signed)
Myelogram Discharge Instructions  1. Go home and rest quietly for the next 24 hours.  It is important to lie flat for the next 24 hours.  Get up only to go to the restroom.  You may lie in the bed or on a couch on your back, your stomach, your left side or your right side.  You may have one pillow under your head.  You may have pillows between your knees while you are on your side or under your knees while you are on your back.  2. DO NOT drive today.  Recline the seat as far back as it will go, while still wearing your seat belt, on the way home.  3. You may get up to go to the bathroom as needed.  You may sit up for 10 minutes to eat.  You may resume your normal diet and medications unless otherwise indicated.  Drink lots of extra fluids today and tomorrow.  4. The incidence of headache, nausea, or vomiting is about 5% (one in 20 patients).  If you develop a headache, lie flat and drink plenty of fluids until the headache goes away.  Caffeinated beverages may be helpful.  If you develop severe nausea and vomiting or a headache that does not go away with flat bed rest, call [phone].  5. You may resume normal activities after your 24 hours of bed rest is over; however, do not exert yourself strongly or do any heavy lifting tomorrow. If when you get up you have a headache when standing, go back to bed and force fluids for another 24 hours.  6. Call your physician for a follow-up appointment.  The results of your myelogram will be sent directly to your physician by the following day.  7. If you have any questions or if complications develop after you arrive home, please call [phone].  Discharge instructions have been explained to the patient.  The patient, or the person responsible for the patient, fully understands these instructions.       May resume Paxil on Jan 30, 2016, after 9:30 am.

## 2016-01-29 NOTE — Progress Notes (Signed)
Patient states she has been off Paxil for at least the past two days.   

## 2016-02-03 DIAGNOSIS — M4807 Spinal stenosis, lumbosacral region: Secondary | ICD-10-CM | POA: Diagnosis not present

## 2016-02-03 DIAGNOSIS — M4125 Other idiopathic scoliosis, thoracolumbar region: Secondary | ICD-10-CM | POA: Diagnosis not present

## 2016-02-03 DIAGNOSIS — M1612 Unilateral primary osteoarthritis, left hip: Secondary | ICD-10-CM | POA: Diagnosis not present

## 2016-02-09 DIAGNOSIS — M4125 Other idiopathic scoliosis, thoracolumbar region: Secondary | ICD-10-CM | POA: Diagnosis not present

## 2016-02-09 DIAGNOSIS — M4807 Spinal stenosis, lumbosacral region: Secondary | ICD-10-CM | POA: Diagnosis not present

## 2016-02-10 DIAGNOSIS — M4125 Other idiopathic scoliosis, thoracolumbar region: Secondary | ICD-10-CM | POA: Diagnosis not present

## 2016-02-10 DIAGNOSIS — M4807 Spinal stenosis, lumbosacral region: Secondary | ICD-10-CM | POA: Diagnosis not present

## 2016-02-13 DIAGNOSIS — M4807 Spinal stenosis, lumbosacral region: Secondary | ICD-10-CM | POA: Diagnosis not present

## 2016-02-13 DIAGNOSIS — M4125 Other idiopathic scoliosis, thoracolumbar region: Secondary | ICD-10-CM | POA: Diagnosis not present

## 2016-02-16 DIAGNOSIS — M4807 Spinal stenosis, lumbosacral region: Secondary | ICD-10-CM | POA: Diagnosis not present

## 2016-02-16 DIAGNOSIS — M4125 Other idiopathic scoliosis, thoracolumbar region: Secondary | ICD-10-CM | POA: Diagnosis not present

## 2016-02-19 DIAGNOSIS — M4125 Other idiopathic scoliosis, thoracolumbar region: Secondary | ICD-10-CM | POA: Diagnosis not present

## 2016-02-19 DIAGNOSIS — M4807 Spinal stenosis, lumbosacral region: Secondary | ICD-10-CM | POA: Diagnosis not present

## 2016-02-24 DIAGNOSIS — M4807 Spinal stenosis, lumbosacral region: Secondary | ICD-10-CM | POA: Diagnosis not present

## 2016-02-24 DIAGNOSIS — M4125 Other idiopathic scoliosis, thoracolumbar region: Secondary | ICD-10-CM | POA: Diagnosis not present

## 2016-02-25 ENCOUNTER — Encounter: Payer: Self-pay | Admitting: Family Medicine

## 2016-02-25 ENCOUNTER — Ambulatory Visit (INDEPENDENT_AMBULATORY_CARE_PROVIDER_SITE_OTHER): Payer: Medicare Other | Admitting: Family Medicine

## 2016-02-25 VITALS — BP 132/82 | HR 81 | Temp 98.3°F | Resp 20 | Ht 62.0 in | Wt 133.0 lb

## 2016-02-25 DIAGNOSIS — N390 Urinary tract infection, site not specified: Secondary | ICD-10-CM | POA: Diagnosis not present

## 2016-02-25 DIAGNOSIS — M5417 Radiculopathy, lumbosacral region: Secondary | ICD-10-CM | POA: Diagnosis not present

## 2016-02-25 DIAGNOSIS — M5416 Radiculopathy, lumbar region: Secondary | ICD-10-CM

## 2016-02-25 NOTE — Patient Instructions (Signed)
I will refer you to neurology for a second opinion. I do want you to continue the PT if able to tolerate.

## 2016-02-25 NOTE — Progress Notes (Signed)
Patient ID: CARAGH WALSTROM, female   DOB: 05-22-1936, 80 y.o.   MRN: [id number]    JAIYAH PINERA , 03/28/1936, 80 y.o., female MRN: [id number]  CC: requesting referral  Subjective:    Lumbar back pain with radiculopathy affecting left lower extremity Patient presents for OV with complaints of lower lumbar pain, which radiates to her left groin, as well as down her left leg to her foot. She states the pain started just about a year ago, and she was tried on prednisone. The prednisone seemed to resolve the acute issue at that time. However patient spends winters  in [place], and this winter she noticed her back became so bad with walking she was in " pain ", that she could not even walk. When she returned from [place] she called [facility] and was scheduled with Dr. Gladstone Lighter a few weeks ago. She had a CT lumbar myelogram completed (full report below). She states she was advised to go to physical therapy, which she has been for 2 weeks. She does not feel she is progressing as well as she would like and at times has had more pain than when she started. She does endorse "today is her best day so far."  She would like to be sent neurology for a second opinion and see if they have more to offer ([facility]). She reports the pain is bad enough now, she is unable to do her daily walk.exercise and she is takingtylenol 3x a day to help. She is on daily 325 ASA, which was started after  A stroke 3 years ago. Patient does not want to take steroids or have steroid shots, without knowing if her back can be "fixed". She has heard about "nerve" medicine, but wants to first see if there is a way to fix the problem from occuring rather take a medication. She has been in a few accidents/falls and has a history of arthritis.   Frequent UTI Patient states she has been on Cipro for frequent UTI infections for many years. She recently has read that cipro can cause bad side effects with  her muscles. She has never had a urology evaluation for this condition or have been seen at this clinic for UTI. She lives part of the year in [place] and travels frequently. She states this condition was probably diagnosed in one of the multiple urgent cares she had been to. She also reports her bladder is "dropping" and may want to discuss having surgery to fix it. She would like to wait until after her back feels better. Urine culture in the system have always been less then 100K and multiple bacteria present. Of note, patient does not have a current UTI.    01/29/2016: Completed through Orthopedics office.  CT LUMBAR MYELOGRAM FINDINGS:  There is  mild lumbar dextroscoliosis. There is at most trace retrolisthesis of L2 on L3 and L4 on L5 and at most trace anterolisthesis of L3 on L4. Vertebral body heights are preserved. There is severe right-sided disc space height loss at L5-S1 with associated vacuum disc phenomenon and endplate sclerosis. Mild disc space narrowing is present at L3-4. The conus medullaris terminates at L1-2. There is a 2 mm intradural nodule in the right lateral aspect of the thecal sac at L1 (series 3, image 16). Mild atherosclerotic aortic calcification is noted. T11-12: Partially visualized, advanced right facet arthrosis. T12-L1: Negative. L1-2: Minimal disc bulging without stenosis. L2-3: Mild anterior disc bulging without stenosis. L3-4:  Disc bulging eccentric to the left, mild ligamentum flavum thickening, and moderate facet arthrosis result in mild left lateral recess and mild left neural foraminal stenosis. No spinal stenosis. L4-5: Minimal disc bulging and mild facet and ligamentum flavum hypertrophy without significant stenosis. L5-S1: Disc bulging, endplate spurring, disc space height loss, and facet arthrosis all asymmetric on the right result in severe right neural foraminal stenosis without spinal stenosis.  IMPRESSION: 1. Mild left lateral recess  and mild left neural foraminal stenosis at L3-4 due to disc bulging and facet arthrosis. 2. Advanced L5-S1 disc degeneration with severe right neural foraminal stenosis. 3. 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 No other nodularity and no provided history of cancer to suggest leptomeningeal metastatic disease. Contrast-enhanced lumbar spine MRI could be considered for further evaluation as clinically warranted.   Allergies  Allergen Reactions  . Sulfa Antibiotics Other (See Comments)    unknown   Social History  Substance Use Topics  . Smoking status: Never Smoker   . Smokeless tobacco: Never Used  . Alcohol Use: No   Past Medical History  Diagnosis Date  . Osteoporosis     started taking meds at age 64 then just went off recently- due to conflict she has heard  . Hyperlipidemia   . Allergy     seasonal  . Mumps as a child  . Measles   . Whooping cough as a child  . Cystic disease of breast   . Arthritis     right hand supplement  . Depression   . Thyroid disease     s/p thyroidectomy, goiter  . Actinic keratosis 08/04/2011  . Preventative health care 08/04/2011  . Frequent UTI 08/04/2011  . Headache(784.0)   . Left ankle sprain 05/22/2012  . Cervical cancer screening 07/10/2012  . Skin lesion of left leg 07/10/2012  . Back pain    Past Surgical History  Procedure Laterality Date  . Removed part of thyroid  2002  . Breast surgery      breast biopsies, b/l benign  . Abdominal hysterectomy  1982    partial still has ovaries, uterine prolapse and menorraghia  . Tee without cardioversion  09/16/2011    Procedure: TRANSESOPHAGEAL ECHOCARDIOGRAM (TEE);  Surgeon: Peter Martinique, MD;  Location: [facility];  Service: Cardiovascular;  Laterality: N/A;  . Appendectomy     Family History  Problem Relation Age of Onset  . Adopted: Yes  . Cancer Mother   . Heart attack Father   . Heart disease Father   . Cancer  Brother     colon  . Anxiety disorder Son      Medication List       This list is accurate as of: 02/25/16 10:57 AM.  Always use your most recent med list.               aspirin 325 MG tablet  Take 325 mg by mouth daily.     atorvastatin 10 MG tablet  Commonly known as:  LIPITOR  Take 1 tablet (10 mg total) by mouth daily.     CALCIUM 600 + D PO  Take 1 tablet by mouth 2 (two) times daily.     cetirizine 10 MG tablet  Commonly known as:  ZYRTEC  Take 1 tablet (10 mg total) by mouth daily as needed.     ciprofloxacin 500 MG tablet  Commonly known as:  CIPRO  Take 1  tablet (500 mg total) by mouth as needed.     levothyroxine 75 MCG tablet  Commonly known as:  SYNTHROID, LEVOTHROID  Take 1 tablet (75 mcg total) by mouth daily.     PARoxetine 10 MG tablet  Commonly known as:  PAXIL  Take 1 tablet (10 mg total) by mouth every morning.     PROBIOTIC ADVANCED PO  Take by mouth.     vitamin B-12 1000 MCG tablet  Commonly known as:  CYANOCOBALAMIN  Take 1,000 mcg by mouth daily.     vitamin C 500 MG tablet  Commonly known as:  ASCORBIC ACID  Take 500 mg by mouth daily.     Vitamin D-3 1000 units Caps  Take by mouth.         ROS: Negative, with the exception of above mentioned in HPI   Objective:  BP 132/82 mmHg  Pulse 81  Temp(Src) 98.3 F (36.8 C) (Oral)  Resp 20  Ht 5\' 2"  (1.575 m)  Wt 133 lb (60.328 kg)  BMI 24.32 kg/m2  SpO2 96% Body mass index is 24.32 kg/(m^2). Gen: Afebrile. No acute distress. Nontoxic in appearance, well developed, well nourished. Pleasant female.  HENT: AT. [place].  MMM, no oral lesions. Eyes:Pupils Equal Round Reactive to light, Extraocular move ments intact,  Conjunctiva without redness, discharge or icterus. Abd: Soft. NTND. BS present MSK: No erythema, moderate chronic spasm right thoracolumbar paraspinal, thoracolumbar right spine curvature, Mild TTP left SI joint. No bony tenderness or step-off. Mild discomfort with left  SB and flexion, moderate discomfort with extension.  Neg SLR bilateral, neg FABRE right, Positive FABRE left for SI pain. Neurovascularly intact distally.  Skin: No rashes, purpura or petechiae.  Neuro: Walking with a cane. Ambulates well with assistance. PERLA. EOMi. Alert. Oriented x3 Cranial nerves II through XII intact. Muscle strength 5/5 bilateral LE. DTRs equal bilaterally. Psych: Normal affect, dress and demeanor. Normal speech. Normal thought content and judgment.  Assessment/Plan: KASHISH AULD is a 80 y.o. female present for acute OV for  1. Lumbar back pain with radiculopathy affecting left lower extremity: - Discussed mainstay of treatment is PT if able to tolerate. Would be cautious with NSAIDS and ASA 325 (needs for prior stroke).  - will refer to neurology for opinion and see if they can offer injections etc. If not felt she qualifies would then at that time consider gabapentin/lyrica.  - CT myelogram results available in EMR (reviewed report and discussed findings with pt in detail) -Pt amendable to plan.  - Ambulatory referral to Neurology (requested specific doctor)   2. Frequent UTI - discussed with patient, she should be seen when she has symptoms. Discussed the need of evaluation before treatment and urine cultures etc.  - If cause we can certainly refer her once we have proof she has symptoms.    > 25 minutes spent with patient, >50% of time spent face to face counseling patient and coordinating care.  electronically signed by:  Howard Pouch, DO  [place]

## 2016-02-26 DIAGNOSIS — M4125 Other idiopathic scoliosis, thoracolumbar region: Secondary | ICD-10-CM | POA: Diagnosis not present

## 2016-02-26 DIAGNOSIS — M4807 Spinal stenosis, lumbosacral region: Secondary | ICD-10-CM | POA: Diagnosis not present

## 2016-02-27 ENCOUNTER — Telehealth: Payer: Self-pay | Admitting: Family Medicine

## 2016-02-27 DIAGNOSIS — E785 Hyperlipidemia, unspecified: Secondary | ICD-10-CM

## 2016-02-27 DIAGNOSIS — E039 Hypothyroidism, unspecified: Secondary | ICD-10-CM

## 2016-02-27 MED ORDER — ATORVASTATIN CALCIUM 10 MG PO TABS: 10.0000 mg | ORAL_TABLET | Freq: Every day | ORAL | Status: DC

## 2016-02-27 MED ORDER — LEVOTHYROXINE SODIUM 75 MCG PO TABS: 75.0000 ug | ORAL_TABLET | Freq: Every day | ORAL | Status: DC

## 2016-02-27 NOTE — Telephone Encounter (Signed)
Atorvastatin & Levothyroxine [facility]

## 2016-02-27 NOTE — Telephone Encounter (Signed)
Patient due for 6 month follow up on hypothyroid and hyperlipidemia prior to anymore refills on medications.

## 2016-03-02 DIAGNOSIS — M4807 Spinal stenosis, lumbosacral region: Secondary | ICD-10-CM | POA: Diagnosis not present

## 2016-03-02 DIAGNOSIS — M4125 Other idiopathic scoliosis, thoracolumbar region: Secondary | ICD-10-CM | POA: Diagnosis not present

## 2016-03-23 DIAGNOSIS — M545 Low back pain: Secondary | ICD-10-CM | POA: Diagnosis not present

## 2016-04-22 DIAGNOSIS — M5136 Other intervertebral disc degeneration, lumbar region: Secondary | ICD-10-CM | POA: Diagnosis not present

## 2016-04-22 DIAGNOSIS — M25552 Pain in left hip: Secondary | ICD-10-CM | POA: Diagnosis not present

## 2016-04-22 DIAGNOSIS — M545 Low back pain: Secondary | ICD-10-CM | POA: Diagnosis not present

## 2016-05-17 DIAGNOSIS — M25552 Pain in left hip: Secondary | ICD-10-CM | POA: Diagnosis not present

## 2016-06-07 DIAGNOSIS — M25552 Pain in left hip: Secondary | ICD-10-CM | POA: Diagnosis not present

## 2016-06-08 ENCOUNTER — Telehealth: Payer: Self-pay

## 2016-06-08 NOTE — Telephone Encounter (Signed)
LM requesting call back to schedule AWV. 

## 2016-06-15 ENCOUNTER — Other Ambulatory Visit: Payer: Self-pay | Admitting: Family Medicine

## 2016-06-15 DIAGNOSIS — E039 Hypothyroidism, unspecified: Secondary | ICD-10-CM

## 2016-06-16 DIAGNOSIS — L821 Other seborrheic keratosis: Secondary | ICD-10-CM | POA: Diagnosis not present

## 2016-06-16 DIAGNOSIS — L570 Actinic keratosis: Secondary | ICD-10-CM | POA: Diagnosis not present

## 2016-06-16 DIAGNOSIS — D1801 Hemangioma of skin and subcutaneous tissue: Secondary | ICD-10-CM | POA: Diagnosis not present

## 2016-07-14 DIAGNOSIS — R7989 Other specified abnormal findings of blood chemistry: Secondary | ICD-10-CM | POA: Diagnosis not present

## 2016-07-14 DIAGNOSIS — E039 Hypothyroidism, unspecified: Secondary | ICD-10-CM | POA: Diagnosis not present

## 2016-07-14 DIAGNOSIS — R799 Abnormal finding of blood chemistry, unspecified: Secondary | ICD-10-CM | POA: Diagnosis not present

## 2016-07-14 DIAGNOSIS — M8580 Other specified disorders of bone density and structure, unspecified site: Secondary | ICD-10-CM | POA: Diagnosis not present

## 2016-07-14 DIAGNOSIS — Z79899 Other long term (current) drug therapy: Secondary | ICD-10-CM | POA: Diagnosis not present

## 2016-07-14 DIAGNOSIS — M1612 Unilateral primary osteoarthritis, left hip: Secondary | ICD-10-CM | POA: Diagnosis not present

## 2016-07-14 DIAGNOSIS — M5137 Other intervertebral disc degeneration, lumbosacral region: Secondary | ICD-10-CM | POA: Diagnosis not present

## 2016-07-14 DIAGNOSIS — Z882 Allergy status to sulfonamides status: Secondary | ICD-10-CM | POA: Diagnosis not present

## 2016-07-14 DIAGNOSIS — E784 Other hyperlipidemia: Secondary | ICD-10-CM | POA: Diagnosis not present

## 2016-07-14 DIAGNOSIS — M25552 Pain in left hip: Secondary | ICD-10-CM | POA: Diagnosis not present

## 2016-07-20 DIAGNOSIS — Z23 Encounter for immunization: Secondary | ICD-10-CM | POA: Diagnosis not present

## 2016-07-30 DIAGNOSIS — Z01812 Encounter for preprocedural laboratory examination: Secondary | ICD-10-CM | POA: Diagnosis not present

## 2016-07-30 DIAGNOSIS — M1612 Unilateral primary osteoarthritis, left hip: Secondary | ICD-10-CM | POA: Diagnosis not present

## 2016-08-10 DIAGNOSIS — G8929 Other chronic pain: Secondary | ICD-10-CM | POA: Diagnosis present

## 2016-08-10 DIAGNOSIS — E785 Hyperlipidemia, unspecified: Secondary | ICD-10-CM | POA: Diagnosis present

## 2016-08-10 DIAGNOSIS — Z96642 Presence of left artificial hip joint: Secondary | ICD-10-CM | POA: Diagnosis not present

## 2016-08-10 DIAGNOSIS — Z79890 Hormone replacement therapy (postmenopausal): Secondary | ICD-10-CM | POA: Diagnosis not present

## 2016-08-10 DIAGNOSIS — G8918 Other acute postprocedural pain: Secondary | ICD-10-CM | POA: Diagnosis not present

## 2016-08-10 DIAGNOSIS — Z79899 Other long term (current) drug therapy: Secondary | ICD-10-CM | POA: Diagnosis not present

## 2016-08-10 DIAGNOSIS — Z7982 Long term (current) use of aspirin: Secondary | ICD-10-CM | POA: Diagnosis not present

## 2016-08-10 DIAGNOSIS — Z471 Aftercare following joint replacement surgery: Secondary | ICD-10-CM | POA: Diagnosis not present

## 2016-08-10 DIAGNOSIS — Z882 Allergy status to sulfonamides status: Secondary | ICD-10-CM | POA: Diagnosis not present

## 2016-08-10 DIAGNOSIS — M1612 Unilateral primary osteoarthritis, left hip: Secondary | ICD-10-CM | POA: Diagnosis present

## 2016-08-10 DIAGNOSIS — Z8673 Personal history of transient ischemic attack (TIA), and cerebral infarction without residual deficits: Secondary | ICD-10-CM | POA: Diagnosis not present

## 2016-08-10 DIAGNOSIS — E039 Hypothyroidism, unspecified: Secondary | ICD-10-CM | POA: Diagnosis present

## 2016-08-10 DIAGNOSIS — F419 Anxiety disorder, unspecified: Secondary | ICD-10-CM | POA: Diagnosis present

## 2016-08-13 ENCOUNTER — Telehealth: Payer: Self-pay | Admitting: Family Medicine

## 2016-08-13 NOTE — Telephone Encounter (Signed)
Called Ms. Packham to scheduled awv with health coach. Left message for Ms. Thurman to call office to scheduled appt.

## 2016-08-18 DIAGNOSIS — M1612 Unilateral primary osteoarthritis, left hip: Secondary | ICD-10-CM | POA: Diagnosis not present

## 2016-08-21 DIAGNOSIS — M1612 Unilateral primary osteoarthritis, left hip: Secondary | ICD-10-CM | POA: Diagnosis not present

## 2016-08-24 DIAGNOSIS — M1612 Unilateral primary osteoarthritis, left hip: Secondary | ICD-10-CM | POA: Diagnosis not present

## 2016-08-25 DIAGNOSIS — Z471 Aftercare following joint replacement surgery: Secondary | ICD-10-CM | POA: Diagnosis not present

## 2016-08-25 DIAGNOSIS — Z96642 Presence of left artificial hip joint: Secondary | ICD-10-CM | POA: Diagnosis not present

## 2016-08-25 DIAGNOSIS — E784 Other hyperlipidemia: Secondary | ICD-10-CM | POA: Diagnosis not present

## 2016-08-25 DIAGNOSIS — E039 Hypothyroidism, unspecified: Secondary | ICD-10-CM | POA: Diagnosis not present

## 2016-08-25 DIAGNOSIS — Z882 Allergy status to sulfonamides status: Secondary | ICD-10-CM | POA: Diagnosis not present

## 2016-08-25 DIAGNOSIS — Z8673 Personal history of transient ischemic attack (TIA), and cerebral infarction without residual deficits: Secondary | ICD-10-CM | POA: Diagnosis not present

## 2016-08-26 DIAGNOSIS — M1612 Unilateral primary osteoarthritis, left hip: Secondary | ICD-10-CM | POA: Diagnosis not present

## 2016-08-30 ENCOUNTER — Other Ambulatory Visit: Payer: Self-pay | Admitting: Family Medicine

## 2016-08-30 DIAGNOSIS — R110 Nausea: Secondary | ICD-10-CM | POA: Diagnosis not present

## 2016-08-30 DIAGNOSIS — N390 Urinary tract infection, site not specified: Secondary | ICD-10-CM | POA: Diagnosis not present

## 2016-08-30 DIAGNOSIS — R531 Weakness: Secondary | ICD-10-CM | POA: Diagnosis not present

## 2016-08-30 DIAGNOSIS — Z90710 Acquired absence of both cervix and uterus: Secondary | ICD-10-CM | POA: Diagnosis not present

## 2016-08-30 DIAGNOSIS — Z7982 Long term (current) use of aspirin: Secondary | ICD-10-CM | POA: Diagnosis not present

## 2016-08-30 DIAGNOSIS — Z96642 Presence of left artificial hip joint: Secondary | ICD-10-CM | POA: Diagnosis not present

## 2016-08-30 DIAGNOSIS — Z9049 Acquired absence of other specified parts of digestive tract: Secondary | ICD-10-CM | POA: Diagnosis not present

## 2016-08-30 DIAGNOSIS — R1084 Generalized abdominal pain: Secondary | ICD-10-CM | POA: Diagnosis not present

## 2016-08-30 DIAGNOSIS — R630 Anorexia: Secondary | ICD-10-CM | POA: Diagnosis not present

## 2016-08-30 DIAGNOSIS — Z882 Allergy status to sulfonamides status: Secondary | ICD-10-CM | POA: Diagnosis not present

## 2016-08-30 DIAGNOSIS — M25552 Pain in left hip: Secondary | ICD-10-CM | POA: Diagnosis not present

## 2016-08-30 DIAGNOSIS — Z8673 Personal history of transient ischemic attack (TIA), and cerebral infarction without residual deficits: Secondary | ICD-10-CM | POA: Diagnosis not present

## 2016-09-01 DIAGNOSIS — R197 Diarrhea, unspecified: Secondary | ICD-10-CM | POA: Diagnosis not present

## 2016-09-01 DIAGNOSIS — R112 Nausea with vomiting, unspecified: Secondary | ICD-10-CM | POA: Diagnosis not present

## 2016-09-01 DIAGNOSIS — Z96642 Presence of left artificial hip joint: Secondary | ICD-10-CM | POA: Diagnosis not present

## 2016-09-01 DIAGNOSIS — R5383 Other fatigue: Secondary | ICD-10-CM | POA: Diagnosis not present

## 2016-09-01 DIAGNOSIS — R1084 Generalized abdominal pain: Secondary | ICD-10-CM | POA: Diagnosis not present

## 2016-09-01 DIAGNOSIS — Z8673 Personal history of transient ischemic attack (TIA), and cerebral infarction without residual deficits: Secondary | ICD-10-CM | POA: Diagnosis not present

## 2016-09-02 DIAGNOSIS — M545 Low back pain: Secondary | ICD-10-CM | POA: Diagnosis not present

## 2016-09-02 DIAGNOSIS — R109 Unspecified abdominal pain: Secondary | ICD-10-CM | POA: Diagnosis not present

## 2016-09-02 DIAGNOSIS — N390 Urinary tract infection, site not specified: Secondary | ICD-10-CM | POA: Diagnosis not present

## 2016-09-02 DIAGNOSIS — E785 Hyperlipidemia, unspecified: Secondary | ICD-10-CM | POA: Diagnosis present

## 2016-09-02 DIAGNOSIS — K922 Gastrointestinal hemorrhage, unspecified: Secondary | ICD-10-CM | POA: Diagnosis not present

## 2016-09-02 DIAGNOSIS — R634 Abnormal weight loss: Secondary | ICD-10-CM | POA: Diagnosis present

## 2016-09-02 DIAGNOSIS — Z8673 Personal history of transient ischemic attack (TIA), and cerebral infarction without residual deficits: Secondary | ICD-10-CM | POA: Diagnosis not present

## 2016-09-02 DIAGNOSIS — E039 Hypothyroidism, unspecified: Secondary | ICD-10-CM | POA: Diagnosis not present

## 2016-09-02 DIAGNOSIS — N12 Tubulo-interstitial nephritis, not specified as acute or chronic: Secondary | ICD-10-CM | POA: Diagnosis not present

## 2016-09-02 DIAGNOSIS — R1030 Lower abdominal pain, unspecified: Secondary | ICD-10-CM | POA: Diagnosis present

## 2016-09-02 DIAGNOSIS — M810 Age-related osteoporosis without current pathological fracture: Secondary | ICD-10-CM | POA: Diagnosis not present

## 2016-09-02 DIAGNOSIS — Z96642 Presence of left artificial hip joint: Secondary | ICD-10-CM | POA: Diagnosis present

## 2016-09-02 DIAGNOSIS — B9620 Unspecified Escherichia coli [E. coli] as the cause of diseases classified elsewhere: Secondary | ICD-10-CM | POA: Diagnosis not present

## 2016-09-02 DIAGNOSIS — K580 Irritable bowel syndrome with diarrhea: Secondary | ICD-10-CM | POA: Diagnosis present

## 2016-09-02 DIAGNOSIS — R6883 Chills (without fever): Secondary | ICD-10-CM | POA: Diagnosis not present

## 2016-09-02 DIAGNOSIS — R102 Pelvic and perineal pain: Secondary | ICD-10-CM | POA: Diagnosis not present

## 2016-09-02 DIAGNOSIS — R110 Nausea: Secondary | ICD-10-CM | POA: Diagnosis not present

## 2016-09-02 DIAGNOSIS — R5383 Other fatigue: Secondary | ICD-10-CM | POA: Diagnosis not present

## 2016-09-02 DIAGNOSIS — Z8711 Personal history of peptic ulcer disease: Secondary | ICD-10-CM | POA: Diagnosis not present

## 2016-09-03 ENCOUNTER — Ambulatory Visit: Payer: Medicare Other | Admitting: Family Medicine

## 2016-09-07 ENCOUNTER — Ambulatory Visit (INDEPENDENT_AMBULATORY_CARE_PROVIDER_SITE_OTHER): Payer: Medicare Other | Admitting: Family Medicine

## 2016-09-07 ENCOUNTER — Encounter: Payer: Self-pay | Admitting: Family Medicine

## 2016-09-07 VITALS — BP 148/84 | HR 62 | Temp 97.2°F | Resp 20

## 2016-09-07 DIAGNOSIS — R8290 Unspecified abnormal findings in urine: Secondary | ICD-10-CM

## 2016-09-07 DIAGNOSIS — Z9289 Personal history of other medical treatment: Secondary | ICD-10-CM

## 2016-09-07 DIAGNOSIS — K580 Irritable bowel syndrome with diarrhea: Secondary | ICD-10-CM

## 2016-09-07 DIAGNOSIS — R1084 Generalized abdominal pain: Secondary | ICD-10-CM

## 2016-09-07 DIAGNOSIS — Z9189 Other specified personal risk factors, not elsewhere classified: Secondary | ICD-10-CM

## 2016-09-07 DIAGNOSIS — Z96642 Presence of left artificial hip joint: Secondary | ICD-10-CM | POA: Insufficient documentation

## 2016-09-07 DIAGNOSIS — E860 Dehydration: Secondary | ICD-10-CM

## 2016-09-07 DIAGNOSIS — R112 Nausea with vomiting, unspecified: Secondary | ICD-10-CM

## 2016-09-07 DIAGNOSIS — Z87440 Personal history of urinary (tract) infections: Secondary | ICD-10-CM

## 2016-09-07 DIAGNOSIS — IMO0001 Reserved for inherently not codable concepts without codable children: Secondary | ICD-10-CM | POA: Insufficient documentation

## 2016-09-07 LAB — POC URINALSYSI DIPSTICK (AUTOMATED)
BILIRUBIN UA: NEGATIVE
Glucose, UA: NEGATIVE
KETONES: NEGATIVE
LEUKOCYTES UA: NEGATIVE
Nitrite, UA: NEGATIVE
PH UA: 7
PROTEIN UA: NEGATIVE
RBC UA: NEGATIVE
SPEC GRAV UA: 1.02
Urobilinogen, UA: 0.2

## 2016-09-07 MED ORDER — HYOSCYAMINE SULFATE SL 0.125 MG SL SUBL: 1.0000 | SUBLINGUAL_TABLET | Freq: Two times a day (BID) | SUBLINGUAL | 0 refills | Status: DC | PRN

## 2016-09-07 MED ORDER — ONDANSETRON HCL 4 MG PO TABS
4.0000 mg | ORAL_TABLET | Freq: Three times a day (TID) | ORAL | 0 refills | Status: DC | PRN
Start: 2016-09-07 — End: 2016-09-13

## 2016-09-07 MED ORDER — DIPHENOXYLATE-ATROPINE 2.5-0.025 MG PO TABS: 1.0000 | ORAL_TABLET | Freq: Three times a day (TID) | ORAL | 0 refills | Status: DC | PRN

## 2016-09-07 NOTE — Patient Instructions (Addendum)
Please take the levsin every 12 hours as needed for the stomach cramping use for 5-7 days to calm down the gut. You will need to monitor for any sedative, confusion, urinary retention. If you experience  Any of this side effects you must stop medication immediately.  Lomotil as prescribed for 5 days, then try to go back to your normal imodium regimen. STOP dilaudid.  HYDRATE G2 and water, more water. Shoot for 60 ounces or more.  You have to eat.  Use zofran every 8 hours for next few days.  Follow up 1 week.

## 2016-09-07 NOTE — Progress Notes (Signed)
Molly Fleming , Nov 21, 1935, 80 y.o., female MRN: [id number] Patient Care Team    Relationship Specialty Notifications Start End  [name], DO PCP - General Family Medicine  02/25/16   [name], MD Consulting Physician Orthopedic Surgery  02/25/16     CC: [facility] F/U Subjective: Patient presents for Transition of care hospital followup. Patient was admitted (again) 09/02/2016, for pyelonephritis and discharge on 09/04/2016 from [place]. She was admitted for orthopedic surgery on 08/10/2016 with a total left hip replacement and discharged 09/02/2016. She reports she was doing well from the hip standpoint, and has even been able to walk with her walker and ride the bike at PT. She has become increasingly weak, nauseated, abdominal pain with frequent loose stools since her surgery. She was seen in the ED on two occasions with multiple labs and CT scans of her abdomen. She was shown to have a UTI and treated with 3 day course of abx. C. Diff was checked and negative. Very mildly anemic, otherwise CBC WNL, CMP WNL, H. Pylori neg, stool pathogen negative. Urine culture after treatment negative.  She was also prescribed zofran which she states she has taken 1-2 times a day. She is tolerating PO, but has not had an appetite and therefore is not taking much food or drink. She taking 2 mg of dilaudid for pain, provided by orthopedic. She states her hip pain is mostly controlled without medications and she is taking the pain medication for her stomach pain. She also endorses mild left sided low back pain. She reports she has had IBS-D her whole life, and usually needs to take imodium daily. She normally has 4 or more loose stools in a day, as she is now. The cramping of her stomach is what is causing the severe pain. Her CT abd was negative, including mesentery. She denies blood per rectum, dysuria  or fever.   CXR 08/30/2016: FINDINGS:  Cardiovascular: Cardiac  silhouette and pulmonary vasculature are within normal limits. Mediastinum: Within normal limits. Lungs/pleura: Clear. Upper abdomen: Visualized portions are unremarkable.  Chest wall/osseous structures: Surgical clips in the left neck.  CT 08/30/2016:  FINDINGS:   LOWER CHEST: .Mediastinum: Within normal limits.  .Heart/vessel: Normal heart size. No pericardial effusion.  .Lungs: Minimal dependent dependent opacities. No consolidation to suggest pneumonia. .Pleura: Within normal limits.   ABDOMEN: .Liver: Minimal low density adjacent to falciform ligament which may represent perfusion difference or focal fat. No significant abnormality. .Gallbladder/biliary: Within normal limits. .Spleen: Within normal limits. .Pancreas: Within normal limits. .Adrenals: Within normal limits. .Kidneys: Symmetrical enhancement without hydronephrosis. Too small to characterize hypodensities. .Peritoneum: Within normal limits. .Mesentery: Within normal limits. .Extraperitoneum: Within normal limits. .GI tract: Normal caliber stomach, small and large bowel. Appendectomy with surgical suture material adjacent to cecum. .Vascular: Minimal scattered atherosclerotic calcifications.  PELVIS: Some limitations are present secondary to artifact from adjacent left hip replacement. .Peritoneum: Within normal limits. .Extraperitoneum: Within normal limits. .Ureters: Within normal limits. .Bladder: Within normal limits. .Reproductive system: Hysterectomy. .Vascular: Within normal limits.  MSK: .Degenerative disc disease at the lumbosacral junction. Scoliosis. Left hip prosthesis. HE.coli UTI  ?? C.Diff zofran Zofran, lomotil and bentyl very low dose.  Gatorade, water 1:1   Allergies  Allergen Reactions  . Sulfa Antibiotics Other (See Comments)    unknown   Social History  Substance Use Topics  . Smoking status: Never Smoker  . Smokeless  tobacco: Never Used  . Alcohol use No  Past Medical History:  Diagnosis Date  . Actinic keratosis 08/04/2011  . Allergy    seasonal  . Arthritis    right hand supplement  . Back pain   . Cervical cancer screening 07/10/2012  . Cystic disease of breast   . Depression   . Frequent UTI 08/04/2011  . Headache(784.0)   . Hyperlipidemia   . Left ankle sprain 05/22/2012  . Measles   . Mumps as a child  . Osteoporosis    started taking meds at age 62 then just went off recently- due to conflict she has heard  . Preventative health care 08/04/2011  . Skin lesion of left leg 07/10/2012  . Thyroid disease    s/p thyroidectomy, goiter  . Whooping cough as a child   Past Surgical History:  Procedure Laterality Date  . ABDOMINAL HYSTERECTOMY  1982   partial still has ovaries, uterine prolapse and menorraghia  . APPENDECTOMY    . BREAST SURGERY     breast biopsies, b/l benign  . removed part of thyroid  2002  . TEE WITHOUT CARDIOVERSION  09/16/2011   Procedure: TRANSESOPHAGEAL ECHOCARDIOGRAM (TEE);  Surgeon: Peter Martinique, MD;  Location: [facility] END[facility]COPY;  Service: Cardiovascular;  Laterality: N/A;  . TOTAL HIP ARTHROPLASTY Left    Family History  Problem Relation Age of Onset  . Adopted: Yes  . Cancer Mother   . Heart attack Father   . Heart disease Father   . Cancer Brother     colon  . Anxiety disorder Son      Medication List       Accurate as of 09/07/16  9:24 AM. Always use your most recent med list.          aspirin 325 MG tablet Take 325 mg by mouth daily.   atorvastatin 10 MG tablet Commonly known as:  LIPITOR Take 1 tablet (10 mg total) by mouth daily.   CALCIUM 600 + D PO Take 1 tablet by mouth 2 (two) times daily.   cetirizine 10 MG tablet Commonly known as:  ZYRTEC Take 1 tablet (10 mg total) by mouth daily as needed.   levothyroxine 75 MCG tablet Commonly known as:  SYNTHROID, LEVOTHROID TAKE ONE TABLET BY MOUTH ONCE DAILY   loperamide 2 MG  tablet Commonly known as:  IMODIUM A-D Take 2 mg by mouth 4 (four) times daily as needed for diarrhea or loose stools.   ondansetron 4 MG tablet Commonly known as:  ZOFRAN Take 4 mg by mouth every 8 (eight) hours as needed for nausea or vomiting.   pantoprazole 40 MG tablet Commonly known as:  PROTONIX Take 40 mg by mouth daily.   PROBIOTIC ADVANCED PO Take by mouth.   vitamin B-12 1000 MCG tablet Commonly known as:  CYANOCOBALAMIN Take 1,000 mcg by mouth daily.   vitamin C 500 MG tablet Commonly known as:  ASCORBIC ACID Take 500 mg by mouth daily.   Vitamin D-3 1000 units Caps Take by mouth.       No results found for this or any previous visit (from the past 24 hour(s)). No results found.   ROS: Negative, with the exception of above mentioned in HPI   Objective:  BP (!) 148/84 (BP Location: Right Arm, Patient Position: Sitting, Cuff Size: Normal)   Pulse 62   Temp 97.2 F (36.2 C)   Resp 20   SpO2 98%  There is no height or weight on file to calculate BMI. Gen: Afebrile. No acute distress.  Nontoxic in appearance, well developed, well nourished. Laying on exam bed, appears fatigued.  HENT: AT. [place]. Very dry mucous membranes.   Eyes:Pupils Equal Round Reactive to light, Extraocular movements intact,  Conjunctiva without redness, discharge or icterus. CV: RRR  Chest: CTAB, no wheeze or crackles. Good air movement, normal resp effort.  Abd: Soft. Mild diffuse abd pain , suprapubic pain. ND. BS present (mildly hyperactive). no Masses palpated. No rebound or guarding.  Skin: no rashes, purpura or petechiae or abd skin changes.  Neuro: PERLA. EOMi. Alert. Oriented x3  Psych: Normal affect, dress and demeanor. Normal speech. Normal thought content and judgment.  Results for orders placed or performed in visit on 09/07/16 (from the past 24 hour(s))  POCT Urinalysis Dipstick (Automated)     Status: Normal   Collection Time: 09/07/16 10:12 AM  Result Value Ref Range    Color, UA yellow    Clarity, UA clear    Glucose, UA negative    Bilirubin, UA negative    Ketones negative    Spec Grav, UA 1.020    Blood, UA negative    pH, UA 7.0    Protein, UA negative    Urobilinogen, UA 0.2    Nitrite, UA negative    Leukocytes, UA Negative Negative     Assessment/Plan: MAKENIZE FALETTI is a 80 y.o. female present for transition of care  OV after toral hip replacement and readmission for pyelonephritis Transition of care performed with sharing of clinical summary History of recent hospitalization Status post total replacement of left hip Recent urinary tract infection - POCT Urinalysis Dipstick (Automated): appears clear, will send for culture.  - Urine Culture - continue routine follow up with orthopedic surgeon as recommended.  Generalized abdominal pain/dehydration/nausea/IBS-D Reviewed all labs and prior images from outside facility with pt and her husband today. Everything was normal. I suspect she is weak from dehydration/lack of food/PO and her continued IBS. She appears dehydrated on exam. I f unable to tolerate PO will need IVF at UC/ED. -  I have asked her to stop her imodium for a few days, and start lomotil for 5-7 days, along with really low dose levsin. Continue zofran (refilled) scheduled for every 8 hours next 2-3 days.  - needs to increase fluids and PO. G2 and water combo and/or water. attempt for at least 60 ounces daily.  - cautioned on anticholingeric properties of Levsin and possible side effects in geriatric population, provided rather low dose, hopefully to calm down the gut. Husband and pt aware of potential side effects and to stop levsin if she develops and to call. - Stop dilaudid if able, may be contributing to nausea.  - F/u 1 week sooner if worsening.    electronically signed by:  Howard Pouch, DO  [place]

## 2016-09-08 ENCOUNTER — Encounter: Payer: Self-pay | Admitting: Physician Assistant

## 2016-09-08 ENCOUNTER — Emergency Department (HOSPITAL_COMMUNITY)
Admission: EM | Admit: 2016-09-08 | Discharge: 2016-09-08 | Disposition: A | Discharge status: Home / Self Care | Payer: Medicare Other | Attending: Emergency Medicine | Admitting: Emergency Medicine

## 2016-09-08 ENCOUNTER — Encounter (HOSPITAL_COMMUNITY): Payer: Self-pay

## 2016-09-08 ENCOUNTER — Telehealth: Payer: Self-pay | Admitting: Family Medicine

## 2016-09-08 DIAGNOSIS — Z7982 Long term (current) use of aspirin: Secondary | ICD-10-CM | POA: Diagnosis not present

## 2016-09-08 DIAGNOSIS — R1030 Lower abdominal pain, unspecified: Secondary | ICD-10-CM | POA: Diagnosis present

## 2016-09-08 DIAGNOSIS — K580 Irritable bowel syndrome with diarrhea: Secondary | ICD-10-CM

## 2016-09-08 DIAGNOSIS — Z96642 Presence of left artificial hip joint: Secondary | ICD-10-CM | POA: Insufficient documentation

## 2016-09-08 DIAGNOSIS — K2970 Gastritis, unspecified, without bleeding: Secondary | ICD-10-CM | POA: Diagnosis not present

## 2016-09-08 DIAGNOSIS — Z79899 Other long term (current) drug therapy: Secondary | ICD-10-CM | POA: Diagnosis not present

## 2016-09-08 DIAGNOSIS — R1031 Right lower quadrant pain: Secondary | ICD-10-CM | POA: Insufficient documentation

## 2016-09-08 DIAGNOSIS — R109 Unspecified abdominal pain: Secondary | ICD-10-CM

## 2016-09-08 DIAGNOSIS — R112 Nausea with vomiting, unspecified: Secondary | ICD-10-CM

## 2016-09-08 DIAGNOSIS — R1084 Generalized abdominal pain: Secondary | ICD-10-CM

## 2016-09-08 DIAGNOSIS — R197 Diarrhea, unspecified: Secondary | ICD-10-CM | POA: Diagnosis not present

## 2016-09-08 LAB — URINALYSIS, ROUTINE W REFLEX MICROSCOPIC
Bilirubin Urine: NEGATIVE
GLUCOSE, UA: NEGATIVE mg/dL
Hgb urine dipstick: NEGATIVE
Ketones, ur: 20 mg/dL — AB
LEUKOCYTES UA: NEGATIVE
Nitrite: NEGATIVE
PH: 7 (ref 5.0–8.0)
Protein, ur: NEGATIVE mg/dL
SPECIFIC GRAVITY, URINE: 1.008 (ref 1.005–1.030)

## 2016-09-08 LAB — COMPREHENSIVE METABOLIC PANEL
ALT: 27 U/L (ref 14–54)
AST: 33 U/L (ref 15–41)
Albumin: 4 g/dL (ref 3.5–5.0)
Alkaline Phosphatase: 78 U/L (ref 38–126)
Anion gap: 9 (ref 5–15)
BUN: 8 mg/dL (ref 6–20)
CHLORIDE: 105 mmol/L (ref 101–111)
CO2: 26 mmol/L (ref 22–32)
Calcium: 9.4 mg/dL (ref 8.9–10.3)
Creatinine, Ser: 0.95 mg/dL (ref 0.44–1.00)
GFR, EST NON AFRICAN AMERICAN: 56 mL/min — AB (ref >60)
Glucose, Bld: 109 mg/dL — ABNORMAL HIGH (ref 65–99)
POTASSIUM: 3.7 mmol/L (ref 3.5–5.1)
Sodium: 140 mmol/L (ref 135–145)
Total Bilirubin: 0.9 mg/dL (ref 0.3–1.2)
Total Protein: 6.7 g/dL (ref 6.5–8.1)

## 2016-09-08 LAB — CBC
HEMATOCRIT: 39 % (ref 36.0–46.0)
HEMOGLOBIN: 12.7 g/dL (ref 12.0–15.0)
MCH: 29.9 pg (ref 26.0–34.0)
MCHC: 32.6 g/dL (ref 30.0–36.0)
MCV: 91.8 fL (ref 78.0–100.0)
Platelets: 322 10*3/uL (ref 150–400)
RBC: 4.25 MIL/uL (ref 3.87–5.11)
RDW: 12.8 % (ref 11.5–15.5)
WBC: 8.9 10*3/uL (ref 4.0–10.5)

## 2016-09-08 LAB — LIPASE, BLOOD: LIPASE: 17 U/L (ref 11–51)

## 2016-09-08 LAB — URINE CULTURE: ORGANISM ID, BACTERIA: NO GROWTH

## 2016-09-08 LAB — MAGNESIUM: Magnesium: 2.4 mg/dL (ref 1.7–2.4)

## 2016-09-08 MED ORDER — PROMETHAZINE HCL 25 MG PO TABS: 25.0000 mg | ORAL_TABLET | Freq: Four times a day (QID) | ORAL | 0 refills | Status: DC | PRN

## 2016-09-08 MED ORDER — OXYCODONE-ACETAMINOPHEN 5-325 MG PO TABS: 1.0000 | ORAL_TABLET | Freq: Four times a day (QID) | ORAL | 0 refills | Status: DC | PRN

## 2016-09-08 MED ORDER — OXYCODONE-ACETAMINOPHEN 5-325 MG PO TABS
1.0000 | ORAL_TABLET | Freq: Once | ORAL | Status: AC
  Administered 2016-09-08: 1 via ORAL
  Filled 2016-09-08: qty 1

## 2016-09-08 MED ORDER — PROMETHAZINE HCL 25 MG/ML IJ SOLN
12.5000 mg | Freq: Once | INTRAMUSCULAR | Status: AC
  Administered 2016-09-08: 12.5 mg via INTRAVENOUS
  Filled 2016-09-08: qty 1

## 2016-09-08 NOTE — ED Triage Notes (Addendum)
Patient arrived from home for ongoing recurrent lower abdominal pain. Had left hip replacement and has had recurrent abdominal pain with multiple work-ups for same at that time. Patient received fentanyl 260mcg by EMS pta. Seen at primary MD yesterday for same. Has had 3 ED visits for same. Patient complains of lower abdominal pain with any movement, EMS reports some diarrhea with same

## 2016-09-08 NOTE — Telephone Encounter (Signed)
Patient husband called and left message asking if they can double up on the medication for diarrhea. Please advise.

## 2016-09-08 NOTE — Telephone Encounter (Signed)
Patient is worse today. Can she get an urgent GI referral?

## 2016-09-08 NOTE — ED Notes (Signed)
Pt states she started to have abd pains and bouts of diarrhea approximately 2 weeks after hip replacement which occurred 1 month ago. Pt stopped her prescription narcs approximately 1 week ago. Pt's main concern is the constant diarrhea and lower abd pain below her navel and straight through to the back.

## 2016-09-08 NOTE — ED Provider Notes (Signed)
[place] DEPT Provider Note   CSN: [id number] Arrival date & time: 09/08/16  1724     History   Chief Complaint Chief Complaint  Patient presents with  . Abdominal Pain    HPI Molly Fleming is a 80 y.o. female.  HPI Pt comes in with cc of abd pain. Pt has hx of IBS and recent hip replacement surgery. Pt reports that about a week and a half after the surgery, pt started having crampy and sharp lower quadrant abd pain. Pt has associated nausea, anorexia and occasional loose BM. Pt has been to [facility] 3 times for her symptoms. On one of the occasions they uncovered that she had a UTI, on subsequent visit she had a CT scan of the abdomen which was normal and an admission to the hospital that didn't yield clear answer. This evening pt's pain was severe and woke her up from her sleep. The pain is still the same character, just severe. No associated seats, emesis, diarrhea. Pt has lost about 10 lbs in the last few days. She has seen her pcp and the pcp has attempted to control the symptoms by starting some meds. Pt also has a GI f/u coming up in about 10 days.  Past Medical History:  Diagnosis Date  . Actinic keratosis 08/04/2011  . Allergy    seasonal  . Arthritis    right hand supplement  . Back pain   . Cervical cancer screening 07/10/2012  . Cystic disease of breast   . Depression   . Frequent UTI 08/04/2011  . Headache(784.0)   . Hyperlipidemia   . Left ankle sprain 05/22/2012  . Measles   . Mumps as a child  . Osteoporosis    started taking meds at age 57 then just went off recently- due to conflict she has heard  . Preventative health care 08/04/2011  . Skin lesion of left leg 07/10/2012  . Thyroid disease    s/p thyroidectomy, goiter  . Whooping cough as a child    Patient Active Problem List   Diagnosis Date Noted  . Status post total replacement of left hip 09/07/2016  . History of recent hospitalization 09/07/2016  . Dehydration 09/07/2016  . Generalized  abdominal pain 09/07/2016  . Nausea and vomiting 09/07/2016  . Transition of care performed with sharing of clinical summary 09/07/2016  . Recent urinary tract infection 09/07/2016  . Irritable bowel syndrome with diarrhea 09/07/2016  . Lumbar back pain with radiculopathy affecting left lower extremity 02/25/2016  . Thyroid activity decreased 08/29/2015  . Inflammatory osteoarthritis 02/17/2015  . Frequent UTI 08/04/2011  . Osteopenia   . Hyperlipidemia   . Cystic disease of breast     Past Surgical History:  Procedure Laterality Date  . ABDOMINAL HYSTERECTOMY  1982   partial still has ovaries, uterine prolapse and menorraghia  . APPENDECTOMY    . BREAST SURGERY     breast biopsies, b/l benign  . removed part of thyroid  2002  . TEE WITHOUT CARDIOVERSION  09/16/2011   Procedure: TRANSESOPHAGEAL ECHOCARDIOGRAM (TEE);  Surgeon: Peter Martinique, MD;  Location: [facility] ENDOSCOPY;  Service: Cardiovascular;  Laterality: N/A;  . TOTAL HIP ARTHROPLASTY Left     OB History    No data available       Home Medications    Prior to Admission medications   Medication Sig Start Date End Date Taking? Authorizing Provider  aspirin 325 MG tablet Take 325 mg by mouth daily.    Historical Provider,  MD  atorvastatin (LIPITOR) 10 MG tablet Take 1 tablet (10 mg total) by mouth daily. 02/27/16   Renee A Kuneff, DO  Calcium Carbonate-Vitamin D (CALCIUM 600 + D PO) Take 1 tablet by mouth 2 (two) times daily.     Historical Provider, MD  cetirizine (ZYRTEC) 10 MG tablet Take 1 tablet (10 mg total) by mouth daily as needed. 08/29/15   Renee A Kuneff, DO  Cholecalciferol (VITAMIN D-3) 1000 units CAPS Take by mouth.    Historical Provider, MD  diphenoxylate-atropine (LOMOTIL) 2.5-0.025 MG tablet Take 1 tablet by mouth 3 (three) times daily as needed for diarrhea or loose stools. 09/07/16   Renee A Kuneff, DO  Hyoscyamine Sulfate SL (LEVSIN/SL) 0.125 MG SUBL Place 1 tablet under the tongue 2 (two) times daily as  needed. 09/07/16   Renee A Kuneff, DO  levothyroxine (SYNTHROID, LEVOTHROID) 75 MCG tablet TAKE ONE TABLET BY MOUTH ONCE DAILY 06/16/16   Renee A Kuneff, DO  loperamide (IMODIUM A-D) 2 MG tablet Take 2 mg by mouth 4 (four) times daily as needed for diarrhea or loose stools.    Historical Provider, MD  ondansetron (ZOFRAN) 4 MG tablet Take 4 mg by mouth every 8 (eight) hours as needed for nausea or vomiting.    Historical Provider, MD  ondansetron (ZOFRAN) 4 MG tablet Take 1 tablet (4 mg total) by mouth every 8 (eight) hours as needed for nausea or vomiting. 09/07/16   Renee A Kuneff, DO  oxyCODONE-acetaminophen (PERCOCET/ROXICET) 5-325 MG tablet Take 1 tablet by mouth every 6 (six) hours as needed for severe pain. 09/08/16   Varney Biles, MD  pantoprazole (PROTONIX) 40 MG tablet Take 40 mg by mouth daily.    Historical Provider, MD  Probiotic Product (PROBIOTIC ADVANCED PO) Take by mouth.    Historical Provider, MD  promethazine (PHENERGAN) 25 MG tablet Take 1 tablet (25 mg total) by mouth every 6 (six) hours as needed for nausea or vomiting. 09/08/16   Varney Biles, MD  vitamin B-12 (CYANOCOBALAMIN) 1000 MCG tablet Take 1,000 mcg by mouth daily.    Historical Provider, MD  vitamin C (ASCORBIC ACID) 500 MG tablet Take 500 mg by mouth daily.    Historical Provider, MD    Family History Family History  Problem Relation Age of Onset  . Adopted: Yes  . Cancer Mother   . Heart attack Father   . Heart disease Father   . Cancer Brother     colon  . Anxiety disorder Son     Social History Social History  Substance Use Topics  . Smoking status: Never Smoker  . Smokeless tobacco: Never Used  . Alcohol use No     Allergies   Sulfa antibiotics   Review of Systems Review of Systems  ROS 10 Systems reviewed and are negative for acute change except as noted in the HPI.     Physical Exam Updated Vital Signs BP 143/64 (BP Location: Left Arm)   Pulse 64   Temp 97.9 F (36.6 C)  (Oral)   Resp 16   SpO2 95%   Physical Exam  Constitutional: She is oriented to person, place, and time. She appears well-developed and well-nourished.  HENT:  Head: Normocephalic and atraumatic.  Mucus membrane slightly dry  Eyes: EOM are normal. Pupils are equal, round, and reactive to light.  Neck: Neck supple.  Cardiovascular: Normal rate, regular rhythm and normal heart sounds.   No murmur heard. Pulmonary/Chest: Effort normal. No respiratory distress.  Abdominal: Soft.  She exhibits no distension. There is tenderness. There is no rebound and no guarding.  Lower quadrant tenderness, worst on the RLQ  Neurological: She is alert and oriented to person, place, and time.  Skin: Skin is warm and dry. Capillary refill takes less than 2 seconds.  Nursing note and vitals reviewed.    ED Treatments / Results  Labs (all labs ordered are listed, but only abnormal results are displayed) Labs Reviewed  COMPREHENSIVE METABOLIC PANEL - Abnormal; Notable for the following:       Result Value   Glucose, Bld 109 (*)    GFR calc non Af Amer 56 (*)    All other components within normal limits  URINALYSIS, ROUTINE W REFLEX MICROSCOPIC - Abnormal; Notable for the following:    Ketones, ur 20 (*)    All other components within normal limits  LIPASE, BLOOD  CBC  MAGNESIUM    EKG  EKG Interpretation None       Radiology No results found.  Procedures Procedures (including critical care time)  Medications Ordered in ED Medications  oxyCODONE-acetaminophen (PERCOCET/ROXICET) 5-325 MG per tablet 1 tablet (1 tablet Oral Given 09/08/16 1946)  promethazine (PHENERGAN) injection 12.5 mg (12.5 mg Intravenous Given 09/08/16 2110)     Initial Impression / Assessment and Plan / ED Course  I have reviewed the triage vital signs and the nursing notes.  Pertinent labs & imaging results that were available during my care of the patient were reviewed by me and considered in my medical  decision making (see chart for details).  Clinical Course as of Sep 08 2224  Wed Sep 08, 2016  2222 Results from the ER workup discussed with the patient face to face and all questions answered to the best of my ability.  Pt given percocet and promethazine here, and her pain improved to 5/10 from 8/10 and she actually wants to eat. She reports that she hasnt felt this well all day today. Labs are reassuring. We will d/c pt with percocet and promethazine. Pt advised to stop dilaudid completely for now, and f/u with GI as planned. We informed her that we have not fixed her problem, just made her feel better - so it would be best to continue engage her pcp and GI for her symptoms. Strict ER return precautions have been discussed, and patient is agreeing with the plan and is comfortable with the workup done and the recommendations from the ER.  [AN]    Clinical Course User Index [AN] Varney Biles, MD    PT comes in with cc ob abd pain. Pt has IBS. Abd pain is chronic and in the lower quadrant.  Pt is s/p appendectomy. Her IBS is overall in pretty good control.  Abd exam not peritoneal. VSS and WNL. Ct scan from [facility] reviewed - no acute findings.  Pt has no vaginal d/c or bleeding. She has no uti like symptoms. Pelvic etiology considered unlikely.  Pt is quite healthy overall. Her symptoms get worse few hours after po intake. Pt has no risk factors for mesenteric ischemia. Pain is out of proportion to the exam - but it is localized to the lower quadrants and is not generalized. I dont see any utility in emergent CT angio - doubt acute ischemia, and the treatment for chronic ischemia will be symptom control anyways. I think GI f/u will be beneficial.  Final Clinical Impressions(s) / ED Diagnoses   Final diagnoses:  Abdominal pain, unspecified abdominal location    New  Prescriptions New Prescriptions   OXYCODONE-ACETAMINOPHEN (PERCOCET/ROXICET) 5-325 MG TABLET    Take 1 tablet by mouth  every 6 (six) hours as needed for severe pain.   PROMETHAZINE (PHENERGAN) 25 MG TABLET    Take 1 tablet (25 mg total) by mouth every 6 (six) hours as needed for nausea or vomiting.     Varney Biles, MD 09/08/16 2230

## 2016-09-08 NOTE — Telephone Encounter (Signed)
Spoke with patient husband reviewed instructions for medication administration. Husband states they doubled up on her medication advised husband to not double upon patient's medication but to follow current instructions reviewed. Husband verbalized understanding. Husband states they do not currently have a GI Dr here.They require a referral as a new patient.

## 2016-09-08 NOTE — ED Notes (Signed)
Report given to [place] RN

## 2016-09-08 NOTE — Telephone Encounter (Signed)
Referral placed.

## 2016-09-08 NOTE — Telephone Encounter (Signed)
Pt can take lomotil every 6 hours if needed and levsin (hyoscyamine) every 8 hours if needed... extreme caution with these medication and geriatric population. If pain worsens she should go to ED (maybe within [facility] system if able) I can put in a referral to GI, however I need to know her GI doctor.

## 2016-09-08 NOTE — Discharge Instructions (Signed)
We saw you in the ER for the abdominal pain. All of our results are normal, including all labs and imaging. Kidney function is fine as well. We are not sure what is causing your abdominal pain. If your symptoms get worse, return to the ER. Take the pain meds and nausea meds as prescribed.

## 2016-09-13 ENCOUNTER — Encounter: Payer: Self-pay | Admitting: Family Medicine

## 2016-09-13 ENCOUNTER — Ambulatory Visit (INDEPENDENT_AMBULATORY_CARE_PROVIDER_SITE_OTHER): Payer: Medicare Other | Admitting: Family Medicine

## 2016-09-13 VITALS — BP 122/68 | HR 82 | Temp 97.3°F | Resp 20

## 2016-09-13 DIAGNOSIS — F1190 Opioid use, unspecified, uncomplicated: Secondary | ICD-10-CM

## 2016-09-13 DIAGNOSIS — K580 Irritable bowel syndrome with diarrhea: Secondary | ICD-10-CM

## 2016-09-13 DIAGNOSIS — R1084 Generalized abdominal pain: Secondary | ICD-10-CM

## 2016-09-13 DIAGNOSIS — Z96642 Presence of left artificial hip joint: Secondary | ICD-10-CM

## 2016-09-13 DIAGNOSIS — R112 Nausea with vomiting, unspecified: Secondary | ICD-10-CM

## 2016-09-13 MED ORDER — OXYCODONE-ACETAMINOPHEN 5-325 MG PO TABS: 0.5000 | ORAL_TABLET | Freq: Four times a day (QID) | ORAL | 0 refills | Status: DC | PRN

## 2016-09-13 MED ORDER — PAROXETINE HCL 10 MG PO TABS
10.0000 mg | ORAL_TABLET | Freq: Every day | ORAL | 0 refills | Status: DC
Start: 2016-09-13 — End: 2016-09-16

## 2016-09-13 NOTE — Progress Notes (Addendum)
Molly Fleming , [birth year], 80 y.o., female MRN: [id number] Patient Care Team    Relationship Specialty Notifications Start End  [name], DO PCP - General Family Medicine  02/25/16   [name], MD Consulting Physician Orthopedic Surgery  02/25/16      Subjective: Irrational behavior: Patient presents with her husband today to discuss her ongoing "irrational" behavior. Since last office visit a week ago for her transition of care, she has been seen in the emergency room at [facility]. She is now at third-4 ED admissions since her total hip surgery in November. Last week patient complained of more IBS type issues, and she was started on IBS regimen with instructions to her husband the potential side effects with those types of medications in a geriatric population. She was prescribed very low dose levsin and lomotil. She was then seen in the emergency room which added Phenergan to her medications. Husband states that the above regimen has not helped with her symptoms. He has concern she is addicted to her narcotic. She has never had any addictive tendencies in the past. He does not think she's been on narcotics in the past. He was started on Dilaudid after her left hip replacement in November by orthopedics. Her husband states that the stop the Dilaudid after our appointment last Tuesday as encouraged. He even had to take her to the emergency room by EMS on Thursday because of her "increased symptoms ". Patient states she has increased cramping and nausea. Etiology of discomfort was again found. She was given Percocet, 8 tabs, and with use of pain medication her symptoms improved. Has been states he has been given her half of the Percocet 5-3 25 about every 6 hours. He reports when it starts to wear off, her irrational behavior and symptoms occur and with use of the narcotic her irrational behavior/delirium resolved. Patient has used Paxil 10 mg in the past for mild anxiety?Marland Kitchen She has had  no psychiatric conditions or admissions.   Prior visit: Patient presents for Transition of care hospital followup. Patient was admitted (again) 09/02/2016, for pyelonephritis and discharge on 09/04/2016 from [facility]. She was admitted for orthopedic surgery on 08/10/2016 with a total left hip replacement and discharged 09/02/2016. She reports she was doing well from the hip standpoint, and has even been able to walk with her walker and ride the bike at PT. She has become increasingly weak, nauseated, abdominal pain with frequent loose stools since her surgery. She was seen in the ED on two occasions with multiple labs and CT scans of her abdomen. She was shown to have a UTI and treated with 3 day course of abx. C. Diff was checked and negative. Very mildly anemic, otherwise CBC WNL, CMP WNL, H. Pylori neg, stool pathogen negative. Urine culture after treatment negative.  She was also prescribed zofran which she states she has taken 1-2 times a day. She is tolerating PO, but has not had an appetite and therefore is not taking much food or drink. She taking 2 mg of dilaudid for pain, provided by orthopedic. She states her hip pain is mostly controlled without medications and she is taking the pain medication for her stomach pain. She also endorses mild left sided low back pain. She reports she has had IBS-D her whole life, and usually needs to take imodium daily. She normally has 4 or more loose stools in a day, as she is now. The cramping of her stomach  is what is causing the severe pain. Her CT abd was negative, including mesentery. She denies blood per rectum, dysuria  or fever.   CXR 08/30/2016: FINDINGS:  Cardiovascular: Cardiac silhouette and pulmonary vasculature are within normal limits. Mediastinum: Within normal limits. Lungs/pleura: Clear. Upper abdomen: Visualized portions are unremarkable.  Chest wall/osseous structures: Surgical clips in the left neck.  CT  08/30/2016:  FINDINGS:   LOWER CHEST: .Mediastinum: Within normal limits.  .Heart/vessel: Normal heart size. No pericardial effusion.  .Lungs: Minimal dependent dependent opacities. No consolidation to suggest pneumonia. .Pleura: Within normal limits.   ABDOMEN: .Liver: Minimal low density adjacent to falciform ligament which may represent perfusion difference or focal fat. No significant abnormality. .Gallbladder/biliary: Within normal limits. .Spleen: Within normal limits. .Pancreas: Within normal limits. .Adrenals: Within normal limits. .Kidneys: Symmetrical enhancement without hydronephrosis. Too small to characterize hypodensities. .Peritoneum: Within normal limits. .Mesentery: Within normal limits. .Extraperitoneum: Within normal limits. .GI tract: Normal caliber stomach, small and large bowel. Appendectomy with surgical suture material adjacent to cecum. .Vascular: Minimal scattered atherosclerotic calcifications.  PELVIS: Some limitations are present secondary to artifact from adjacent left hip replacement. .Peritoneum: Within normal limits. .Extraperitoneum: Within normal limits. .Ureters: Within normal limits. .Bladder: Within normal limits. .Reproductive system: Hysterectomy. .Vascular: Within normal limits.     Allergies  Allergen Reactions  . Sulfa Antibiotics Other (See Comments)    unknown   Social History  Substance Use Topics  . Smoking status: Never Smoker  . Smokeless tobacco: Never Used  . Alcohol use No   Past Medical History:  Diagnosis Date  . Actinic keratosis 08/04/2011  . Allergy    seasonal  . Arthritis    right hand supplement  . Back pain   . Cervical cancer screening 07/10/2012  . Cystic disease of breast   . Depression   . Frequent UTI 08/04/2011  . Headache(784.0)   . Hyperlipidemia   . Left ankle sprain 05/22/2012  . Measles   . Mumps as a child  . Osteoporosis    started taking meds  at age 62 then just went off recently- due to conflict she has heard  . Preventative health care 08/04/2011  . Skin lesion of left leg 07/10/2012  . Thyroid disease    s/p thyroidectomy, goiter  . Whooping cough as a child   Past Surgical History:  Procedure Laterality Date  . ABDOMINAL HYSTERECTOMY  1982   partial still has ovaries, uterine prolapse and menorraghia  . APPENDECTOMY    . BREAST SURGERY     breast biopsies, b/l benign  . removed part of thyroid  2002  . TEE WITHOUT CARDIOVERSION  09/16/2011   Procedure: TRANSESOPHAGEAL ECHOCARDIOGRAM (TEE);  Surgeon: Peter Martinique, MD;  Location: [facility] ENDOSCOPY;  Service: Cardiovascular;  Laterality: N/A;  . TOTAL HIP ARTHROPLASTY Left    Family History  Problem Relation Age of Onset  . Adopted: Yes  . Cancer Mother   . Heart attack Father   . Heart disease Father   . Cancer Brother     colon  . Anxiety disorder Son    Allergies as of 09/13/2016      Reactions   Sulfa Antibiotics Other (See Comments)   unknown      Medication List       Accurate as of 09/13/16 11:13 AM. Always use your most recent med list.          aspirin 325 MG tablet Take 325 mg by mouth daily.   atorvastatin 10 MG tablet  Commonly known as:  LIPITOR Take 1 tablet (10 mg total) by mouth daily.   CALCIUM 600 + D PO Take 1 tablet by mouth 2 (two) times daily.   cetirizine 10 MG tablet Commonly known as:  ZYRTEC Take 1 tablet (10 mg total) by mouth daily as needed.   diphenoxylate-atropine 2.5-0.025 MG tablet Commonly known as:  LOMOTIL Take 1 tablet by mouth 3 (three) times daily as needed for diarrhea or loose stools.   Hyoscyamine Sulfate SL 0.125 MG Subl Commonly known as:  LEVSIN/SL Place 1 tablet under the tongue 2 (two) times daily as needed.   levothyroxine 75 MCG tablet Commonly known as:  SYNTHROID, LEVOTHROID TAKE ONE TABLET BY MOUTH ONCE DAILY   loperamide 2 MG tablet Commonly known as:  IMODIUM A-D Take 2 mg by mouth 4  (four) times daily as needed for diarrhea or loose stools.   ondansetron 4 MG tablet Commonly known as:  ZOFRAN Take 4 mg by mouth every 8 (eight) hours as needed for nausea or vomiting.   ondansetron 4 MG tablet Commonly known as:  ZOFRAN Take 1 tablet (4 mg total) by mouth every 8 (eight) hours as needed for nausea or vomiting.   oxyCODONE-acetaminophen 5-325 MG tablet Commonly known as:  PERCOCET/ROXICET Take 1 tablet by mouth every 6 (six) hours as needed for severe pain.   pantoprazole 40 MG tablet Commonly known as:  PROTONIX Take 40 mg by mouth daily.   PROBIOTIC ADVANCED PO Take by mouth.   promethazine 25 MG tablet Commonly known as:  PHENERGAN Take 1 tablet (25 mg total) by mouth every 6 (six) hours as needed for nausea or vomiting.   vitamin B-12 1000 MCG tablet Commonly known as:  CYANOCOBALAMIN Take 1,000 mcg by mouth daily.   vitamin C 500 MG tablet Commonly known as:  ASCORBIC ACID Take 500 mg by mouth daily.   Vitamin D-3 1000 units Caps Take by mouth.       No results found for this or any previous visit (from the past 24 hour(s)). No results found.   ROS: Negative, with the exception of above mentioned in HPI   Objective:  BP 122/68 (BP Location: Left Arm, Patient Position: Sitting, Cuff Size: Normal)   Pulse 82   Temp 97.3 F (36.3 C)   Resp 20   SpO2 97%  There is no height or weight on file to calculate BMI. Gen: Afebrile. No acute distress. Nontoxic in appearance, well developed, well nourished. Again lying on exam bed, moaning, stating she can't live like this. HENT: Atraumatic, normocephalic, dry mucous membranes..   Eyes:Pupils Equal Round Reactive to light, Extraocular movements intact,  Conjunctiva without redness, discharge or icterus. CV: RRR  Abd: Soft. Mild diffuse abd pain, nondistended, bowel sounds present. No masses palpated. No rebound or guarding.  Neuro: PERLA. EOMi. Alert. Oriented x3  Psych: Staring upwards healing,  appears confused, and then answers questions appropriately. Repeatedly states she "can't live like this ".   No results found for this or any previous visit (from the past 24 hour(s)).   Assessment/P lan: FRICA LAHOOD is a 80 y.o. female present for increased Generalized abdominal pain/dehydration/nausea/IBS-D - again reviewed all labs with patient, and her husband. I'm uncertain what is causing her symptoms at this time. All symptoms seem to occur before any the anticholinergics supplied last week. None of which seem to be helping her by report. Therefore discontinue Phenergan, Levsin and Lomotil. - She has an appointment to establish  with gastroenterologist this Thursday, who can address her abdominal issues, and if it is felt to be IBS they will restart medications they feel necessary. - UTI was treated with 2 negative follow-up cultures. - agreed to refill short-term Percocet (5-325), half tab every 6 hours and look into resources for her to help "wean "off medication. Again I'm uncertain at this time if this is actually narcotics dependence, but cannot rule out especially since her symptoms seem to resolve after medication provided. She has been on a narcotic for 8 weeks. Also consider this is actually pain causing delirium. Discussed with her husband that that is not something we would manage in a primary care setting, and would need monitoring closely by specialist. I will not provide continued longterm narcotics. If  she is needing continued narcotic medication for her pain she will need to follow with her surgeon. - Sources will be investigated, and she will be referred to a specialist to help with her "addiction". - encouraged to a  psychiatric cause was also discussed, restart her Paxil 10 mg. Discussed with patient's husband  if they feel she  needs emergency psychiatric care, he is to take her to [facility] long emergency room since they have behavior health available. He is agreeable to  this plan.   Greater than 40 minutes spent with patient, >50% of time spent face to face counseling and coordinating care  electronically signed by:  Howard Pouch, DO  [place]

## 2016-09-13 NOTE — Patient Instructions (Addendum)
Restart paxil 10 mg daily.  Stop promethazine, levsin (hyoscyamine) and lomotil You can continue zofran for nausea if needed.   If increase in delirium you will need to go to [facility] long ED. I will provide very short course of pain medication, for her to take 1/2 tab every 6 hours. I am currently uncertain if her condition is secondary to pain or other addiction. I will look into resources and call you.  Follow up in 4 weeks

## 2016-09-14 ENCOUNTER — Telehealth: Payer: Self-pay | Admitting: *Deleted

## 2016-09-14 ENCOUNTER — Encounter (HOSPITAL_COMMUNITY): Payer: Self-pay

## 2016-09-14 ENCOUNTER — Ambulatory Visit: Payer: Medicare Other | Admitting: Family Medicine

## 2016-09-14 ENCOUNTER — Emergency Department (HOSPITAL_COMMUNITY)
Admission: EM | Admit: 2016-09-14 | Discharge: 2016-09-14 | Disposition: A | Discharge status: Home / Self Care | Payer: Medicare Other | Attending: Emergency Medicine | Admitting: Emergency Medicine

## 2016-09-14 DIAGNOSIS — R410 Disorientation, unspecified: Secondary | ICD-10-CM | POA: Insufficient documentation

## 2016-09-14 DIAGNOSIS — Z96642 Presence of left artificial hip joint: Secondary | ICD-10-CM | POA: Insufficient documentation

## 2016-09-14 DIAGNOSIS — F1193 Opioid use, unspecified with withdrawal: Secondary | ICD-10-CM

## 2016-09-14 DIAGNOSIS — F1123 Opioid dependence with withdrawal: Secondary | ICD-10-CM | POA: Diagnosis not present

## 2016-09-14 DIAGNOSIS — Z79899 Other long term (current) drug therapy: Secondary | ICD-10-CM | POA: Insufficient documentation

## 2016-09-14 DIAGNOSIS — Z7982 Long term (current) use of aspirin: Secondary | ICD-10-CM | POA: Diagnosis not present

## 2016-09-14 LAB — URINALYSIS, ROUTINE W REFLEX MICROSCOPIC
BILIRUBIN URINE: NEGATIVE
Glucose, UA: NEGATIVE mg/dL
Hgb urine dipstick: NEGATIVE
KETONES UR: NEGATIVE mg/dL
Nitrite: NEGATIVE
PH: 7 (ref 5.0–8.0)
PROTEIN: NEGATIVE mg/dL
SQUAMOUS EPITHELIAL / LPF: NONE SEEN
Specific Gravity, Urine: 1.011 (ref 1.005–1.030)

## 2016-09-14 LAB — CBC WITH DIFFERENTIAL/PLATELET
BASOS ABS: 0 10*3/uL (ref 0.0–0.1)
Basophils Relative: 0 %
Eosinophils Absolute: 0.1 10*3/uL (ref 0.0–0.7)
Eosinophils Relative: 1 %
HEMATOCRIT: 39 % (ref 36.0–46.0)
HEMOGLOBIN: 13.1 g/dL (ref 12.0–15.0)
LYMPHS ABS: 3.1 10*3/uL (ref 0.7–4.0)
LYMPHS PCT: 37 %
MCH: 30 pg (ref 26.0–34.0)
MCHC: 33.6 g/dL (ref 30.0–36.0)
MCV: 89.2 fL (ref 78.0–100.0)
Monocytes Absolute: 0.5 10*3/uL (ref 0.1–1.0)
Monocytes Relative: 6 %
NEUTROS ABS: 4.7 10*3/uL (ref 1.7–7.7)
Neutrophils Relative %: 56 %
Platelets: 218 10*3/uL (ref 150–400)
RBC: 4.37 MIL/uL (ref 3.87–5.11)
RDW: 12.7 % (ref 11.5–15.5)
WBC: 8.4 10*3/uL (ref 4.0–10.5)

## 2016-09-14 LAB — BASIC METABOLIC PANEL
ANION GAP: 9 (ref 5–15)
BUN: 11 mg/dL (ref 6–20)
CHLORIDE: 106 mmol/L (ref 101–111)
CO2: 25 mmol/L (ref 22–32)
Calcium: 9.4 mg/dL (ref 8.9–10.3)
Creatinine, Ser: 0.76 mg/dL (ref 0.44–1.00)
GFR calc Af Amer: >60 mL/min (ref >60)
GLUCOSE: 97 mg/dL (ref 65–99)
POTASSIUM: 4 mmol/L (ref 3.5–5.1)
SODIUM: 140 mmol/L (ref 135–145)

## 2016-09-14 LAB — CBG MONITORING, ED: Glucose-Capillary: 79 mg/dL (ref 65–99)

## 2016-09-14 MED ORDER — LORAZEPAM 0.5 MG PO TABS: 0.5000 mg | ORAL_TABLET | Freq: Four times a day (QID) | ORAL | 0 refills | Status: DC | PRN

## 2016-09-14 MED ORDER — SODIUM CHLORIDE 0.9 % IV BOLUS (SEPSIS)
500.0000 mL | Freq: Once | INTRAVENOUS | Status: AC
  Administered 2016-09-14: 500 mL via INTRAVENOUS

## 2016-09-14 MED ORDER — LORAZEPAM 2 MG/ML IJ SOLN
1.0000 mg | Freq: Once | INTRAMUSCULAR | Status: AC
  Administered 2016-09-14: 1 mg via INTRAVENOUS
  Filled 2016-09-14: qty 1

## 2016-09-14 NOTE — Discharge Instructions (Signed)
Stop oxycodone.   Start taking Tylenol 1000 mg every 8 hours as needed for pain.  Ativan as prescribed today as needed for anxiety.  Return to the emergency department if symptoms significantly worsen or change.

## 2016-09-14 NOTE — ED Provider Notes (Signed)
[facility] DEPT Provider Note   CSN: [id number] Arrival date & time: 09/14/16  [id number]     History   Chief Complaint Chief Complaint  Patient presents with  . lethargic  . Altered Mental Status    HPI Molly Fleming is a 80 y.o. female.  Patient is a 80 year old female with past medical history of high cholesterol, arthritis, and recent total hip replacement secondary to osteoarthritis. She's been taking pain medication (oxycodone) since that time. The patient has been experiencing episodes of shakiness, jitteriness, and confusion when trying to reduce her dose of this medication. This morning, she again became anxious and confused and husband brings her for evaluation of this. She was seen by her primary doctor yesterday who prescribed Paxil. She has had one dose of this.   The history is provided by the patient.  Altered Mental Status   This is a new problem. The current episode started more than 1 week ago. The problem has been gradually worsening. Associated symptoms include confusion. Risk factors include a change in prescription.    Past Medical History:  Diagnosis Date  . Actinic keratosis 08/04/2011  . Allergy    seasonal  . Arthritis    right hand supplement  . Back pain   . Cervical cancer screening 07/10/2012  . Cystic disease of breast   . Depression   . Frequent UTI 08/04/2011  . Headache(784.0)   . Hyperlipidemia   . Left ankle sprain 05/22/2012  . Measles   . Mumps as a child  . Osteoporosis    started taking meds at age 39 then just went off recently- due to conflict she has heard  . Preventative health care 08/04/2011  . Skin lesion of left leg 07/10/2012  . Thyroid disease    s/p thyroidectomy, goiter  . Whooping cough as a child    Patient Active Problem List   Diagnosis Date Noted  . Narcotic drug use 09/13/2016  . Status post total replacement of left hip 09/07/2016  . History of recent hospitalization 09/07/2016  . Dehydration 09/07/2016    . Generalized abdominal pain 09/07/2016  . Nausea and vomiting 09/07/2016  . Recent urinary tract infection 09/07/2016  . Irritable bowel syndrome with diarrhea 09/07/2016  . Lumbar back pain with radiculopathy affecting left lower extremity 02/25/2016  . Thyroid activity decreased 08/29/2015  . Inflammatory osteoarthritis 02/17/2015  . Frequent UTI 08/04/2011  . Osteopenia   . Hyperlipidemia   . Cystic disease of breast     Past Surgical History:  Procedure Laterality Date  . ABDOMINAL HYSTERECTOMY  1982   partial still has ovaries, uterine prolapse and menorraghia  . APPENDECTOMY    . BREAST SURGERY     breast biopsies, b/l benign  . removed part of thyroid  2002  . TEE WITHOUT CARDIOVERSION  09/16/2011   Procedure: TRANSESOPHAGEAL ECHOCARDIOGRAM (TEE);  Surgeon: Peter Martinique, MD;  Location: [facility] ENDOSCOPY;  Service: Cardiovascular;  Laterality: N/A;  . TOTAL HIP ARTHROPLASTY Left     OB History    No data available       Home Medications    Prior to Admission medications   Medication Sig Start Date End Date Taking? Authorizing Provider  aspirin 325 MG tablet Take 325 mg by mouth daily.    Historical Provider, MD  atorvastatin (LIPITOR) 10 MG tablet Take 1 tablet (10 mg total) by mouth daily. 02/27/16   Renee A Kuneff, DO  Calcium Carbonate-Vitamin D (CALCIUM 600 + D PO) Take 1  tablet by mouth 2 (two) times daily.     Historical Provider, MD  cetirizine (ZYRTEC) 10 MG tablet Take 1 tablet (10 mg total) by mouth daily as needed. 08/29/15   Renee A Kuneff, DO  Cholecalciferol (VITAMIN D-3) 1000 units CAPS Take by mouth.    Historical Provider, MD  levothyroxine (SYNTHROID, LEVOTHROID) 75 MCG tablet TAKE ONE TABLET BY MOUTH ONCE DAILY 06/16/16   Renee A Kuneff, DO  loperamide (IMODIUM A-D) 2 MG tablet Take 2 mg by mouth 4 (four) times daily as needed for diarrhea or loose stools.    Historical Provider, MD  ondansetron (ZOFRAN) 4 MG tablet Take 4 mg by mouth every 8 (eight)  hours as needed for nausea or vomiting.    Historical Provider, MD  oxyCODONE-acetaminophen (PERCOCET/ROXICET) 5-325 MG tablet Take 0.5 tablets by mouth every 6 (six) hours as needed for severe pain. 09/13/16   Renee A Kuneff, DO  pantoprazole (PROTONIX) 40 MG tablet Take 40 mg by mouth daily.    Historical Provider, MD  PARoxetine (PAXIL) 10 MG tablet Take 1 tablet (10 mg total) by mouth daily. 09/13/16   Renee A Kuneff, DO  Probiotic Product (PROBIOTIC ADVANCED PO) Take by mouth.    Historical Provider, MD  vitamin B-12 (CYANOCOBALAMIN) 1000 MCG tablet Take 1,000 mcg by mouth daily.    Historical Provider, MD  vitamin C (ASCORBIC ACID) 500 MG tablet Take 500 mg by mouth daily.    Historical Provider, MD    Family History Family History  Problem Relation Age of Onset  . Adopted: Yes  . Cancer Mother   . Heart attack Father   . Heart disease Father   . Cancer Brother     colon  . Anxiety disorder Son     Social History Social History  Substance Use Topics  . Smoking status: Never Smoker  . Smokeless tobacco: Never Used  . Alcohol use No     Allergies   Sulfa antibiotics   Review of Systems Review of Systems  Psychiatric/Behavioral: Positive for confusion.  All other systems reviewed and are negative.    Physical Exam Updated Vital Signs BP 163/79 (BP Location: Left Arm)   Pulse 71   Temp 97.6 F (36.4 C) (Oral)   Resp 18   Ht 5\' 2"  (1.575 m)   Wt 116 lb (52.6 kg)   SpO2 100%   BMI 21.22 kg/m   Physical Exam  Constitutional: She is oriented to person, place, and time. She appears well-developed and well-nourished. No distress.  Patient appears very anxious and tremulous.  HENT:  Head: Normocephalic and atraumatic.  Neck: Normal range of motion. Neck supple.  Cardiovascular: Normal rate and regular rhythm.  Exam reveals no gallop and no friction rub.   No murmur heard. Pulmonary/Chest: Effort normal and breath sounds normal. No respiratory distress. She has  no wheezes.  Abdominal: Soft. Bowel sounds are normal. She exhibits no distension. There is no tenderness.  Musculoskeletal: Normal range of motion.  Neurological: She is alert and oriented to person, place, and time.  Skin: Skin is warm and dry. She is not diaphoretic.  Nursing note and vitals reviewed.    ED Treatments / Results  Labs (all labs ordered are listed, but only abnormal results are displayed) Labs Reviewed  BASIC METABOLIC PANEL  CBC WITH DIFFERENTIAL/PLATELET  CBG MONITORING, ED    EKG  EKG Interpretation None       Radiology No results found.  Procedures Procedures (including critical care  time)  Medications Ordered in ED Medications  sodium chloride 0.9 % bolus 500 mL (not administered)  LORazepam (ATIVAN) injection 1 mg (not administered)     Initial Impression / Assessment and Plan / ED Course  I have reviewed the triage vital signs and the nursing notes.  Pertinent labs & imaging results that were available during my care of the patient were reviewed by me and considered in my medical decision making (see chart for details).  Clinical Course     Patient brought by husband for evaluation of shakiness and anxiety that seems to be brought about by her weaning off of the oxycodone she was prescribed following hip surgery. When I saw her, she was tearful, shaky, and hyperventilating. She was given intravenous Ativan and her symptoms have completely resolved. She does not report significant discomfort from her hip and it seems to be not taking the pain pill that precipitates these episodes. She may have some sort of physical withdrawal that triggers an anxiety attack. Nothing else in the blood work indicates anything different. She will be discharged with a small quantity of Ativan. I've also advised the husband to try to not give the oxycodone.  Final Clinical Impressions(s) / ED Diagnoses   Final diagnoses:  None    New Prescriptions New  Prescriptions   No medications on file     Veryl Speak, MD 09/14/16 1301

## 2016-09-14 NOTE — ED Notes (Signed)
Pt reports she feels weak and has some stomach pain.  Pt is tearful stating that she took only what was prescribed by her doctor.  Pt comforted.

## 2016-09-14 NOTE — Telephone Encounter (Signed)
Spoke with patient husband this am regarding appt for today patient was seen yesterday and I wanted to confirm patient didn't need appt today. Patient husband states they meant to cancel that appt. Patient husband states he is going to take patient to [facility] for evaluation today. Dr Raoul Pitch aware.

## 2016-09-14 NOTE — ED Triage Notes (Signed)
Patient presents confused, lethargic. Patient's husband states the patient had hip surgery 2 months go and he has been trying to wean the patient from pain meds. Patient last took oxycodone 5/325 mg this AM because patient had increased confusion and weakness. Patient's husband states when she gets like this he usually gives her some pain medication and she "snaps out of it."

## 2016-09-16 ENCOUNTER — Telehealth: Payer: Self-pay | Admitting: *Deleted

## 2016-09-16 ENCOUNTER — Encounter: Payer: Self-pay | Admitting: Physician Assistant

## 2016-09-16 ENCOUNTER — Ambulatory Visit (INDEPENDENT_AMBULATORY_CARE_PROVIDER_SITE_OTHER): Payer: Medicare Other | Admitting: Physician Assistant

## 2016-09-16 VITALS — BP 102/62 | HR 70 | Ht 62.0 in | Wt 117.0 lb

## 2016-09-16 DIAGNOSIS — R251 Tremor, unspecified: Secondary | ICD-10-CM

## 2016-09-16 DIAGNOSIS — R5383 Other fatigue: Secondary | ICD-10-CM

## 2016-09-16 DIAGNOSIS — K588 Other irritable bowel syndrome: Secondary | ICD-10-CM

## 2016-09-16 DIAGNOSIS — R197 Diarrhea, unspecified: Secondary | ICD-10-CM

## 2016-09-16 DIAGNOSIS — R1030 Lower abdominal pain, unspecified: Secondary | ICD-10-CM

## 2016-09-16 DIAGNOSIS — Z86010 Personal history of colonic polyps: Secondary | ICD-10-CM | POA: Diagnosis not present

## 2016-09-16 MED ORDER — NA SULFATE-K SULFATE-MG SULF 17.5-3.13-1.6 GM/177ML PO SOLN: 1.0000 | Freq: Once | ORAL | 0 refills | Status: AC

## 2016-09-16 MED ORDER — DICYCLOMINE HCL 10 MG PO CAPS: 10.0000 mg | ORAL_CAPSULE | Freq: Three times a day (TID) | ORAL | 1 refills | Status: DC

## 2016-09-16 NOTE — Progress Notes (Signed)
Reviewed. If the waitt is too long for colonoscopy, please Route this note to an M.D. with availability who can perform the procedure and then assume the patient's care thereafter as this is a new patient to the practice. Thanks

## 2016-09-16 NOTE — Progress Notes (Signed)
Agree with assessment and plan. I have an opening next week for colonoscopy and will assume care of this patient. Will perform colonoscopy, rule out microscopic colitis.

## 2016-09-16 NOTE — Telephone Encounter (Signed)
Per Dr. Henrene Pastor to Ellouise Newer PA, since he doesn't have anything until 10-28-2016, he would be ok for the patient to have her colonoscopy with another provider since she is having trouble.  I scheduled it with Dr. Havery Moros for 09-22-2016 at 10:00 am.  Anderson Malta asked Dr. Havery Moros and he said he was fine with that. Patient informed of the change and I went over instructions with her.

## 2016-09-16 NOTE — Progress Notes (Signed)
Chief Complaint: Abdominal Pain, Diarrhea, Nausea  HPI:  Molly Fleming is a 80 year old Caucasian female with past medical history of arthritis, IBS, depression, hyperlipidemia, thyroid disease disease and status post recent left hip replacement on 08/10/16, who was referred to me by Howard Pouch A, DO for a complaint of abdominal pain, diarrhea, nausea, weakness and fatigue.   Extensive review of chart shows records from patient's visits to [facility] on multiple occasions as well as a recent admission from 09/02/16 through 09/07/16 for the above symptoms. On 08/30/16 patient was found to have a UTI and placed on antibiotics for 3 days. She completed this regimen and continued with symptoms. She was then admitted with a completely negative workup including CT abdomen pelvis. Labs included a CBC which shows the patient is chronically anemic with a hemoglobin around 1, though iron studies on 09/02/16 were normal other than a transferrin minimally low at 207. CBC is otherwise normal. CMP was normal. Stool testing including C. difficile and GI pathogen panel as well as H. pylori fecal antigen were negative on 09/02/16. Lipase was minimally elevated on 08/30/16 at 62 with a decrease to 17 on 09/01/2016 and normalization on 09/02/2016. There were discussions from the doctors that they thought some of her symptoms were related to her chronic IBS as well as anxiety and even possibly panic attacks.   She was also seen in the ER at [facility] on 09/08/2016 and 09/14/2016 for continued symptoms. Most recently on the 19th it was thought these attacks were related to panic attacks as she described shaking and weakness, it was thought possibly by the patient that she was having trouble with pain medicine and a reaction to coming off of this. ED physician prescribed her Ativan every 6 hours as this seemed to help her in the ER. Again labs were normal.   Today, the patient presents to clinic accompanied by her  husband who does assist with her history. She tells me that she had hip surgery 08/10/16 and did well for about 2 weeks after that time with oxycodone pain medications. After that time she started to decrease her pain medicines and apparently this is when all of her symptoms started to increase. The patient tells me that she has had an increase in loose stools, typically she is able to control these due to her IBS with half Imodium twice a day as well as a probiotic, but she had been doing this and continued to have 4-5 loose stools per day and was also incontinent. Again stool studies were negative as above. Patient describes a lower abdominal cramping pain which is worse after a bowel movement, but can occur intermittently throughout the day. This seems to radiate into her lower back. The patient's husband describes that after the patient has a bowel movement her belly pain increases and this seems to bring on "these attacks". During these attacks the patient becomes shaky and somewhat incoherent. Her hands "visibly shake". The patient has been off of her oxycodone for the past 4 days and has continued with symptoms. She has been taking  Ativan every 6 hours but will typically have shaking episodes 2-4 hours after taking the medicine. They do not feel like this has been helping over the past 3-4 days. Patient also tells me that these symptoms awaken her from her sleep. In fact she can wake up with the shaking attacks, so does not believe they can be related to panic.   Patient also describes  that she has had a feeling of chills and has had a total weight loss of 8 pounds since time of her hip surgery due to having no appetite. She has occasional heartburn and chronic nausea.   Patient describes that IBS was diagnosed years ago by a GI physician in [place]. Her last colonoscopy was around 2005/2006 and her husband reports a history of polyps and being told they needed to repeat this in 5 years. She has not had  one since.   There was some vague description of possibly melenic stools during patient's hospitalization at [place], but when asked, the patient tells me that these were just darker than normal and  Hemoccult testing done which was negative. She has had no further since then.   Patient tells me she cannot drink boost or Ensure shakes as these make her sick. She is able to tolerate Carnation breakfast shakes.   Patient's social history is positive for her 72 year old sister having a hip replacement and passing away 2 months later, her husband tells me that this does make her anxious/has added to her symptoms.   Patient denies fever, bright red blood in her stool, vomiting, reflux or dysphagia. She has also not been using NSAIDs.  Past Medical History:  Diagnosis Date  . Actinic keratosis 08/04/2011  . Allergy    seasonal  . Arthritis    right hand supplement  . Back pain   . Cervical cancer screening 07/10/2012  . Cystic disease of breast   . Depression   . Frequent UTI 08/04/2011  . Headache(784.0)   . Hyperlipidemia   . Left ankle sprain 05/22/2012  . Measles   . Mumps as a child  . Osteoporosis    started taking meds at age 80 then just went off recently- due to conflict she has heard  . Preventative health care 08/04/2011  . Skin lesion of left leg 07/10/2012  . Thyroid disease    s/p thyroidectomy, goiter  . Whooping cough as a child    Past Surgical History:  Procedure Laterality Date  . ABDOMINAL HYSTERECTOMY  1982   partial still has ovaries, uterine prolapse and menorraghia  . APPENDECTOMY    . BREAST SURGERY     breast biopsies, b/l benign  . removed part of thyroid  2002  . TEE WITHOUT CARDIOVERSION  09/16/2011   Procedure: TRANSESOPHAGEAL ECHOCARDIOGRAM (TEE);  Surgeon: Peter Martinique, MD;  Location: [facility] ENDOSCOPY;  Service: Cardiovascular;  Laterality: N/A;  . TOTAL HIP ARTHROPLASTY Left     Current Outpatient Prescriptions  Medication Sig Dispense  Refill  . aspirin 325 MG tablet Take 325 mg by mouth daily.    Marland Kitchen atorvastatin (LIPITOR) 10 MG tablet Take 1 tablet (10 mg total) by mouth daily. 90 tablet 0  . levothyroxine (SYNTHROID, LEVOTHROID) 75 MCG tablet TAKE ONE TABLET BY MOUTH ONCE DAILY 90 tablet 0  . loperamide (IMODIUM A-D) 2 MG tablet Take 1 mg by mouth 2 (two) times daily as needed for diarrhea or loose stools.    Marland Kitchen LORazepam (ATIVAN) 0.5 MG tablet Take 1 tablet (0.5 mg total) by mouth every 6 (six) hours as needed for anxiety. 12 tablet 0  . oxyCODONE-acetaminophen (PERCOCET/ROXICET) 5-325 MG tablet Take 0.5 tablets by mouth every 6 (six) hours as needed for severe pain. 10 tablet 0  . Probiotic Product (PROBIOTIC ADVANCED PO) Take by mouth.     No current facility-administered medications for this visit.     Allergies as of 09/16/2016 -  Review Complete 09/16/2016  Allergen Reaction Noted  . Sulfa antibiotics Other (See Comments) 09/14/2011    Family History  Problem Relation Age of Onset  . Adopted: Yes  . Cancer Mother   . Heart attack Father   . Heart disease Father   . Cancer Brother     colon  . Anxiety disorder Son     Social History   Social History  . Marital status: Married    Spouse name: N/A  . Number of children: 3  . Years of education: N/A   Occupational History  . retired    Social History Main Topics  . Smoking status: Never Smoker  . Smokeless tobacco: Never Used  . Alcohol use No  . Drug use: No  . Sexual activity: Yes    Partners: Male    Birth control/ protection: Post-menopausal   Other Topics Concern  . Not on file   Social History Narrative   Ms. Jurich is retired. She lives with her husband. They reside in [place] and [place] part-year.    Review of Systems:     Constitutional: Positive for fatigue, weakness and chills as well as weight loss of 8 pounds in the past month HEENT: Eyes: No change in vision               Ears, Nose, Throat:  No change in hearing Skin: No rash or  itching Cardiovascular: No chest pain Respiratory: No SOB Gastrointestinal: See HPI and otherwise negative Genitourinary: No dysuria or change in urinary frequency Neurological: Positive for confusion and shakiness No headache Musculoskeletal: Positive for recent left hip replacement, no pain per patient  Hematologic: No bleeding Psychiatric: No history of depression or anxiety   Physical Exam:  Vital signs: BP 102/62   Pulse 70   Ht 5\' 2"  (1.575 m)   Wt 117 lb (53.1 kg)   BMI 21.40 kg/m   Constitutional: Tearful elderly Caucasian female appears to be in mild distress, thin appearing, alert and cooperative Head:  Normocephalic and atraumatic. Eyes:   PEERL, EOMI. No icterus. Conjunctiva pink. Ears:  Normal auditory acuity. Neck:  Supple Throat: Oral cavity and pharynx without inflammation, swelling or lesion.  Respiratory: Respirations even and unlabored. Lungs clear to auscultation bilaterally.   No wheezes, crackles, or rhonchi.  Cardiovascular: Normal S1, S2. No MRG. Regular rate and rhythm. No peripheral edema, cyanosis or pallor.  Gastrointestinal:  Soft, nondistended, mild lower abdominal tenderness. No rebound or guarding. Hyperactive bowel sounds. No appreciable masses or hepatomegaly. Rectal:  Not performed.  Msk:  Symmetrical without gross deformities. Without edema, no deformity or joint abnormality.  Neurologic:  Alert and  oriented x4;  grossly normal neurologically.  Skin:   Dry and intact without significant lesions or rashes. Psychiatric:  Demonstrates good judgement and reason without abnormal affect or behaviors.  RELEVANT LABS AND IMAGING: CBC    Component Value Date/Time   WBC 8.4 09/14/2016 1041   RBC 4.37 09/14/2016 1041   HGB 13.1 09/14/2016 1041   HCT 39.0 09/14/2016 1041   PLT 218 09/14/2016 1041   MCV 89.2 09/14/2016 1041   MCH 30.0 09/14/2016 1041   MCHC 33.6 09/14/2016 1041   RDW 12.7 09/14/2016 1041   LYMPHSABS 3.1 09/14/2016 1041   MONOABS  0.5 09/14/2016 1041   EOSABS 0.1 09/14/2016 1041   BASOSABS 0.0 09/14/2016 1041    CMP     Component Value Date/Time   NA 140 09/14/2016 1041   K 4.0 09/14/2016 1041  CL 106 09/14/2016 1041   CO2 25 09/14/2016 1041   GLUCOSE 97 09/14/2016 1041   BUN 11 09/14/2016 1041   CREATININE 0.76 09/14/2016 1041   CALCIUM 9.4 09/14/2016 1041   PROT 6.7 09/08/2016 2002   ALBUMIN 4.0 09/08/2016 2002   AST 33 09/08/2016 2002   ALT 27 09/08/2016 2002   ALKPHOS 78 09/08/2016 2002   BILITOT 0.9 09/08/2016 2002   GFRNONAA >60 09/14/2016 1041   GFRAA >60 09/14/2016 1041   Result Impression   No acute findings in the abdomen or pelvis to account for patient's nausea. Ancillary findings as above.  Result Narrative  CT ABDOMEN PELVIS W CONTRAST (ROUTINE), 08/30/2016 2:03 PM  INDICATION:abdominal pain \ R10.84 Abdominal pain, acute, generalized  COMPARISON: None.  TECHNIQUE: Multislice axial images were obtained through the abdomen and pelvis with administration of iodinated intravenous contrast material. Multi-planar reformatted images were generated for additional analysis. Nongated technique limits cardiac detail.  All CT scans at [facility] and [facility] are performed using dose optimization techniques as appropriate to a performed exam, including but not limited to one or more of the following: automated exposure control, adjustment of the mA and/or kV according to patient size, use of iterative reconstruction technique. In addition, [facility] is participating in the [place] program which will further assist [place] in optimizing patient radiation exposure.   FINDINGS:   LOWER CHEST: .Mediastinum: Within normal limits.  .Heart/vessel: Normal heart size. No pericardial effusion.  .Lungs: Minimal dependent dependent opacities. No consolidation to suggest pneumonia. .Pleura: Within normal limits.   ABDOMEN: .Liver: Minimal low  density adjacent to falciform ligament which may represent perfusion difference or focal fat. No significant abnormality. .Gallbladder/biliary: Within normal limits. .Spleen: Within normal limits. .Pancreas: Within normal limits. .Adrenals: Within normal limits. .Kidneys: Symmetrical enhancement without hydronephrosis. Too small to characterize hypodensities. .Peritoneum: Within normal limits. .Mesentery: Within normal limits. .Extraperitoneum: Within normal limits. .GI tract: Normal caliber stomach, small and large bowel. Appendectomy with surgical suture material adjacent to cecum. .Vascular: Minimal scattered atherosclerotic calcifications.  PELVIS: Some limitations are present secondary to artifact from adjacent left hip replacement. .Peritoneum: Within normal limits. .Extraperitoneum: Within normal limits. .Ureters: Within normal limits. .Bladder: Within normal limits. .Reproductive system: Hysterectomy. .Vascular: Within normal limits.  MSK: .Degenerative disc disease at the lumbosacral junction. Scoliosis. Left hip prosthesis.    Assessment: 1. Lower abdominal pain:Patient describes lower abdominal cramping pain intermittently throughout the day, worse after a bowel movement, patient's husband believes that sometimes this brings on her "shaking" episodes, history of IBS; consider relation to IBS versus other 2. Diarrhea: Stool studies including C. Difficile, GI pathogen panel and others have been negative, patient describes 4-5 loose bowel movements per day, likely with IBS above, though could consider microscopic colitis 3. IBS: Long history, symptoms were controlled on half an Imodium twice a day and a probiotic, but since time of hip surgery nothing has been working, likely symptoms were exacerbated after antibiotics for hip surgery and recent UTI as well as stress and anxiety 4. History of colon polyps: In 2005 at time of patient's last  colonoscopy in [place], we do not have report,  but are requesting this today 5. Fatigue: Patient has no energy and cannot sleep at night, likely related to all the above and possibly anxiety 6. Tremulousness: Uncertain what this is from, workup has been negative, husband describes shaking episodes during which the patient is somewhat incoherent, this was somewhat helped with Ativan  while patient was in the hospital, currently Ativan is not helping, husband thinks this is worse after episodes of abdominal pain, consider relation to IBS/anxiety/possibly other, will likely need follow-up with PCP regarding this symptom  Plan: 1. Recommend the patient schedule colonoscopy due to increase in symptoms recently as well as a history of colon polyps in 2005. Discussed risks, benefits, limitations and alternatives and the patient agrees to proceed. This was scheduled with Dr. Henrene Pastor as he is a supervising physician today in the [facility]. Will discuss with him if we can get the patient in sooner than February 1. This is per the patient request as they're worried about what is going on. 2. Recommend the patient increase her Imodium, she should take 2 tabs twice a day. Did discuss that she can take up to 6 tabs a day if needed. Long-term patient will likely not benefit from being on this medicine, but short-term this may help her be able to leave the house. 3. Started the patient on IB Gard 2 pills twice a day, provided her with samples. Discussed that she should remain on this for at least 2 months 4. Prescribed Dicyclomine 10 mg to be used 3 times daily, #90 with 1 refill 5. Recommend the patient drink Carnation breakfast shakes if she likes the taste of these at least 3 times a day, to hopefully give her some energy 6. I will not refill Ativan today, we did discuss this. They do not feel as though it is helping and we do not typically prescribe these medications. Discussed with the patient's husband that if they feel  like she needs to be on an antianxiety medicine then they should see her PCP regarding this. 7. Patient to remain off of her pain medicine. 8. Requesting patient's recent colonoscopy report from 2005 in [place]. Patient to follow in clinic per Dr. Blanch Media recommendations after time of colonoscopy or sooner if necessary.  Molly Newer, PA-C [facility] 09/16/2016, 9:38 AM  Cc: Howard Pouch A, DO

## 2016-09-16 NOTE — Patient Instructions (Signed)
We sent  prescriptions to [facility]. 1. Dicyclomine ( Bentyl) 10 mg.  2. Colonoscopy prep- Suprep  We have given you samples of IB Gard and a coupon. Take 1 tab twice daily.  You can get this at [facility] or [facility] or [facility].   Take Imodium take 2 tab twice daily, y ou can take up to 6 but no more than 6.   Take Carnation Breakfast shakes.   Follow up with Dr. Henrene Pastor in 2-3 months.   You have been scheduled for a colonoscopy. Please follow written instructions given to you at your visit today.  Please pick up your prep supplies at the pharmacy within the next 1-3 days. If you use inhalers (even only as needed), please bring them with you on the day of your procedure. Your physician has requested that you go to www.startemmi.com and enter the access code given to you at your visit today. This web site gives a general overview about your procedure. However, you should still follow specific instructions given to you by our office regarding your preparation for the procedure.

## 2016-09-17 ENCOUNTER — Telehealth: Payer: Self-pay | Admitting: Family Medicine

## 2016-09-17 MED ORDER — LORAZEPAM 0.5 MG PO TABS: 0.5000 mg | ORAL_TABLET | Freq: Four times a day (QID) | ORAL | 0 refills | Status: DC | PRN

## 2016-09-17 NOTE — Telephone Encounter (Signed)
I printed ativan rx but I also suggest she start the paxil as instructed by Dr. Leana Gamer

## 2016-09-17 NOTE — Telephone Encounter (Signed)
Patient husband aware to start Paxil as prescribed. Rx for ativan faxed to patient pharmacy.

## 2016-09-17 NOTE — Telephone Encounter (Signed)
Spoke with patient husband he states the Ativan is calming her down enough where she can eat and doesn't have the nausea and shaking . He states the ER only gave them 12 tabs on Tuesday and they are down to one tablet . He is requesting a refill on this medication. Dr Raoul Pitch had started patient on Paxil but husband states he is not giving her that medication at this time since he is giving her the ativan. Please advise.

## 2016-09-17 NOTE — Telephone Encounter (Signed)
Lorazepam 0.5 MG [facility]. This is the only medication that is works for the patient. Patient is taking one pill for each attack which is approximately 3 times a day.

## 2016-09-22 ENCOUNTER — Ambulatory Visit (AMBULATORY_SURGERY_CENTER): Payer: Medicare Other | Admitting: Gastroenterology

## 2016-09-22 ENCOUNTER — Encounter: Payer: Self-pay | Admitting: Gastroenterology

## 2016-09-22 VITALS — BP 119/71 | HR 62 | Temp 97.8°F | Resp 14 | Ht 62.0 in | Wt 117.0 lb

## 2016-09-22 DIAGNOSIS — R634 Abnormal weight loss: Secondary | ICD-10-CM | POA: Diagnosis not present

## 2016-09-22 DIAGNOSIS — D129 Benign neoplasm of anus and anal canal: Secondary | ICD-10-CM

## 2016-09-22 DIAGNOSIS — R1030 Lower abdominal pain, unspecified: Secondary | ICD-10-CM | POA: Diagnosis not present

## 2016-09-22 DIAGNOSIS — R112 Nausea with vomiting, unspecified: Secondary | ICD-10-CM | POA: Diagnosis not present

## 2016-09-22 DIAGNOSIS — Z86010 Personal history of colonic polyps: Secondary | ICD-10-CM | POA: Diagnosis not present

## 2016-09-22 DIAGNOSIS — E039 Hypothyroidism, unspecified: Secondary | ICD-10-CM | POA: Diagnosis not present

## 2016-09-22 DIAGNOSIS — D122 Benign neoplasm of ascending colon: Secondary | ICD-10-CM

## 2016-09-22 DIAGNOSIS — D128 Benign neoplasm of rectum: Secondary | ICD-10-CM

## 2016-09-22 DIAGNOSIS — R197 Diarrhea, unspecified: Secondary | ICD-10-CM

## 2016-09-22 DIAGNOSIS — K621 Rectal polyp: Secondary | ICD-10-CM | POA: Diagnosis not present

## 2016-09-22 MED ORDER — SODIUM CHLORIDE 0.9 % IV SOLN: 500.0000 mL | INTRAVENOUS | Status: AC

## 2016-09-22 MED ORDER — ONDANSETRON HCL 4 MG PO TABS: 4.0000 mg | ORAL_TABLET | Freq: Three times a day (TID) | ORAL | 0 refills | Status: DC | PRN

## 2016-09-22 NOTE — Patient Instructions (Signed)
YOU HAD AN ENDOSCOPIC PROCEDURE TODAY AT [facility]:   Refer to the procedure report that was given to you for any specific questions about what was found during the examination.  If the procedure report does not answer your questions, please call your gastroenterologist to clarify.  If you requested that your care partner not be given the details of your procedure findings, then the procedure report has been included in a sealed envelope for you to review at your convenience later.  YOU SHOULD EXPECT: Some feelings of bloating in the abdomen. Passage of more gas than usual.  Walking can help get rid of the air that was put into your GI tract during the procedure and reduce the bloating. If you had a lower endoscopy (such as a colonoscopy or flexible sigmoidoscopy) you may notice spotting of blood in your stool or on the toilet paper. If you underwent a bowel prep for your procedure, you may not have a normal bowel movement for a few days.  Please Note:  You might notice some irritation and congestion in your nose or some drainage.  This is from the oxygen used during your procedure.  There is no need for concern and it should clear up in a day or so.  SYMPTOMS TO REPORT IMMEDIATELY:   Following lower endoscopy (colonoscopy or flexible sigmoidoscopy):  Excessive amounts of blood in the stool  Significant tenderness or worsening of abdominal pains  Swelling of the abdomen that is new, acute  Fever of 100F or highe  For urgent or emergent issues, a gastroenterologist can be reached at any hour by calling [phone].   DIET:  We do recommend a small meal at first, but then you may proceed to your regular diet.  Drink plenty of fluids but you should avoid alcoholic beverages for 24 hours.  ACTIVITY:  You should plan to take it easy for the rest of today and you should NOT DRIVE or use heavy machinery until tomorrow (because of the sedation medicines used during the test).     FOLLOW UP: Our staff will call the number listed on your records the next business day following your procedure to check on you and address any questions or concerns that you may have regarding the information given to you following your procedure. If we do not reach you, we will leave a message.  However, if you are feeling well and you are not experiencing any problems, there is no need to return our call.  We will assume that you have returned to your regular daily activities without incident.  Hemorrhoids  (handout given)) Polyps (handout given) Await biopsy results to determined next repeat Colonoscopy NO Ibuprofen, Naproxen, or other non-steriodal anti-inflammatory drugs for two weeks after polyps removal.  If any biopsies were taken you will be contacted by phone or by letter within the next 1-3 weeks.  Please call us at [phone] if you have not heard about the biopsies in 3 weeks.    SIGNATURES/CONFIDENTIALITY: You and/or your care partner have signed paperwork which will be entered into your electronic medical record.  These signatures attest to the fact that that the information above on your After Visit Summary has been reviewed and is understood.  Full responsibility of the confidentiality of this discharge information lies with you and/or your care-partner.

## 2016-09-22 NOTE — Progress Notes (Signed)
Called to room to assist during endoscopic procedure.  Patient ID and intended procedure confirmed with present staff. Received instructions for my participation in the procedure from the performing physician.  

## 2016-09-22 NOTE — Progress Notes (Signed)
To PACU, vss patent aw report to rn 

## 2016-09-22 NOTE — Op Note (Signed)
[facility] Patient Name: Molly Fleming Procedure Date: 09/22/2016 9:58 AM MRN: [id number] Endoscopist: Remo Lipps P. Kameria Canizares MD, MD Age: 80 Referring MD:  Date of Birth: 02-24-1936 Gender: Female Account #: [id number] Procedure:                Colonoscopy Indications:              Lower abdominal pain, persistent diarrhea without                            clear etiology Medicines:                Monitored Anesthesia Care Procedure:                Pre-Anesthesia Assessment:                           - Prior to the procedure, a History and Physical                            was performed, and patient medications and                            allergies were reviewed. The patient's tolerance of                            previous anesthesia was also reviewed. The risks                            and benefits of the procedure and the sedation                            options and risks were discussed with the patient.                            All questions were answered, and informed consent                            was obtained. Prior Anticoagulants: The patient has                            taken aspirin, last dose was 1 day prior to                            procedure. ASA Grade Assessment: II - A patient                            with mild systemic disease. After reviewing the                            risks and benefits, the patient was deemed in                            satisfactory condition to undergo the procedure.  After obtaining informed consent, the colonoscope                            was passed under direct vision. Throughout the                            procedure, the patient's blood pressure, pulse, and                            oxygen saturations were monitored continuously. The                            Model PCF-H190L [id number]) scope was introduced                            through the anus and advanced to the  the terminal                            ileum, with identification of the appendiceal                            orifice and IC valve. The colonoscopy was performed                            without difficulty. The patient tolerated the                            procedure well. The quality of the bowel                            preparation was good. The terminal ileum, ileocecal                            valve, appendiceal orifice, and rectum were                            photographed. Scope In: 10:08:07 AM Scope Out: 10:27:41 AM Scope Withdrawal Time: 0 hours 15 minutes 52 seconds  Total Procedure Duration: 0 hours 19 minutes 34 seconds  Findings:                 The perianal and digital rectal examinations were                            normal.                           A 6 mm polyp was found in the ascending colon. The                            polyp was sessile. The polyp was removed with a                            cold snare. Resection and retrieval were complete.  A 4 mm polyp was found in the rectum. The polyp was                            sessile. The polyp was removed with a cold snare.                            Resection and retrieval were complete.                           A few small-mouthed diverticula were found in the                            left colon.                           Internal hemorrhoids were found during                            retroflexion. The hemorrhoids were small.                           The exam was otherwise without abnormality.                           The terminal ileum appeared normal.                           Biopsies for histology were taken with a cold                            forceps from the right colon, left colon and                            transverse colon for evaluation of microscopic                            colitis. Complications:            No immediate complications. Estimated blood  loss:                            Minimal. Estimated Blood Loss:     Estimated blood loss was minimal. Impression:               - One 6 mm polyp in the ascending colon, removed                            with a cold snare. Resected and retrieved.                           - One 4 mm polyp in the rectum, removed with a cold                            snare. Resected and retrieved.                           -  Diverticulosis in the left colon.                           - Internal hemorrhoids.                           - The examination was otherwise normal. No                            inflammatory changes.                           - The examined portion of the ileum was normal.                           - Biopsies were taken with a cold forceps from the                            right colon, left colon and transverse colon for                            evaluation of microscopic colitis. Recommendation:           - Patient has a contact number available for                            emergencies. The signs and symptoms of potential                            delayed complications were discussed with the                            patient. Return to normal activities tomorrow.                            Written discharge instructions were provided to the                            patient.                           - Resume previous diet.                           - Continue present medications.                           - No ibuprofen, naproxen, or other non-steroidal                            anti-inflammatory drugs for 2 weeks after polyp                            removal.                           - Await pathology results.                           -  Repeat colonoscopy is recommended for                            surveillance. The colonoscopy date will be                            determined after pathology results from today's                            exam become available  for review. Remo Lipps P. Karmello Abercrombie MD, MD 09/22/2016 10:35:40 AM This report has been signed electronically.

## 2016-09-23 ENCOUNTER — Telehealth: Payer: Self-pay

## 2016-09-23 NOTE — Telephone Encounter (Signed)
  Follow up Call-  Call back number 09/22/2016  Post procedure Call Back phone  # [phone]  Permission to leave phone message Yes  Some recent data might be hidden     Patient questions:  Do you have a fever, pain , or abdominal swelling? No. Pain Score  0 *  Have you tolerated food without any problems? Yes.    Have you been able to return to your normal activities? Yes.    Do you have any questions about your discharge instructions: Diet   No. Medications  No. Follow up visit  No.  Do you have questions or concerns about your Care? No.  Actions: * If pain score is 4 or above: No action needed, pain <4.

## 2016-09-24 ENCOUNTER — Telehealth: Payer: Self-pay | Admitting: Family Medicine

## 2016-09-24 MED ORDER — LORAZEPAM 0.5 MG PO TABS: 0.5000 mg | ORAL_TABLET | Freq: Three times a day (TID) | ORAL | 0 refills | Status: DC | PRN

## 2016-09-24 NOTE — Telephone Encounter (Signed)
Mr. Kerzner notified that prescription was faxed to pharmacy requested.

## 2016-09-24 NOTE — Telephone Encounter (Signed)
I advised patient's husband of instructions.  I told him I wasn't sure if RX has been faxed or not yet.  Please contact him when it is faxed.  Thanks!

## 2016-09-24 NOTE — Telephone Encounter (Signed)
Lorazepam 0.5 MG [facility]. Dr. Anitra Lauth filled last week while Dr. Raoul Pitch was out. ER only gave patient 7 pills. This has been the only medication that has worked for the patient. Patient will need it today to make it through the weekend.

## 2016-09-24 NOTE — Telephone Encounter (Signed)
Please contact pt or husband: - I will provide her with a 1 month script of ativan 0.5 mg TID PRN. This should not be a long term medication for her.  She is to taper back to no more than every 8 hours. The need of this medication should become less and less with the use of her paxil. Paxil will take a few weeks to reach potential and we may need to increase it at her f/u in 4 few weeks (shceduled from start of paxil-last visit).

## 2016-09-24 NOTE — Telephone Encounter (Signed)
Please advise 

## 2016-09-28 NOTE — Telephone Encounter (Signed)
I cant find any any of the documentation that patient is on this medication. Its not on her medication list. Please advise

## 2016-09-28 NOTE — Telephone Encounter (Signed)
Patient is requesting Rx for Gabapentin. She is currently taking 300 MG 3 times a day, however she has been weaning herself off it. Can she get a lower dosage? The pills she has are capsules & she can't break them. Please send to [place].

## 2016-09-28 NOTE — Telephone Encounter (Signed)
Please inform pt this medication Is not prescribed by me. She will need to obtain refills from  the prescribing physician. I do not have record of her even being on this medicin, so it is  likley presscribde by someone out of our system (neuro or ortho?), name should be on the bottle or she can call her pharmacy. Sorry.

## 2016-09-29 NOTE — Telephone Encounter (Signed)
Patient's husband calling back to check the status of refill request.  I informed him of instructions from Dr. Raoul Pitch to contact prescribing physician.  He voiced understanding and stated they would contact prescribing physician for request.

## 2016-09-29 NOTE — Telephone Encounter (Signed)
Patient's husband calling back to report he has not been able to get a hold of the emergency room physician who prescribed this medication.  They are getting desperate and really need assistance with getting a refill and change in dosage.

## 2016-10-06 DIAGNOSIS — M25552 Pain in left hip: Secondary | ICD-10-CM | POA: Diagnosis not present

## 2016-10-06 DIAGNOSIS — R030 Elevated blood-pressure reading, without diagnosis of hypertension: Secondary | ICD-10-CM | POA: Diagnosis not present

## 2016-10-12 ENCOUNTER — Other Ambulatory Visit: Payer: Self-pay | Admitting: *Deleted

## 2016-10-12 DIAGNOSIS — E7849 Other hyperlipidemia: Secondary | ICD-10-CM

## 2016-10-12 MED ORDER — ATORVASTATIN CALCIUM 10 MG PO TABS: 10.0000 mg | ORAL_TABLET | Freq: Every day | ORAL | 0 refills | Status: DC

## 2016-10-12 MED ORDER — LEVOTHYROXINE SODIUM 75 MCG PO TABS: 75.0000 ug | ORAL_TABLET | Freq: Every day | ORAL | 0 refills | Status: DC

## 2016-10-12 NOTE — Telephone Encounter (Signed)
Refilled levothyroxine and lipitor .

## 2016-10-20 DIAGNOSIS — Z471 Aftercare following joint replacement surgery: Secondary | ICD-10-CM | POA: Diagnosis not present

## 2016-10-20 DIAGNOSIS — E039 Hypothyroidism, unspecified: Secondary | ICD-10-CM | POA: Diagnosis not present

## 2016-10-20 DIAGNOSIS — Z8673 Personal history of transient ischemic attack (TIA), and cerebral infarction without residual deficits: Secondary | ICD-10-CM | POA: Diagnosis not present

## 2016-10-20 DIAGNOSIS — E784 Other hyperlipidemia: Secondary | ICD-10-CM | POA: Diagnosis not present

## 2016-10-20 DIAGNOSIS — Z882 Allergy status to sulfonamides status: Secondary | ICD-10-CM | POA: Diagnosis not present

## 2016-10-20 DIAGNOSIS — Z96642 Presence of left artificial hip joint: Secondary | ICD-10-CM | POA: Diagnosis not present

## 2016-10-28 ENCOUNTER — Encounter: Payer: Medicare Other | Admitting: Internal Medicine

## 2016-11-11 DIAGNOSIS — M25552 Pain in left hip: Secondary | ICD-10-CM | POA: Diagnosis not present

## 2016-11-11 DIAGNOSIS — R030 Elevated blood-pressure reading, without diagnosis of hypertension: Secondary | ICD-10-CM | POA: Diagnosis not present

## 2016-11-11 DIAGNOSIS — M5136 Other intervertebral disc degeneration, lumbar region: Secondary | ICD-10-CM | POA: Diagnosis not present

## 2016-11-24 DIAGNOSIS — Z8673 Personal history of transient ischemic attack (TIA), and cerebral infarction without residual deficits: Secondary | ICD-10-CM | POA: Diagnosis not present

## 2016-11-24 DIAGNOSIS — E039 Hypothyroidism, unspecified: Secondary | ICD-10-CM | POA: Diagnosis not present

## 2016-11-24 DIAGNOSIS — E785 Hyperlipidemia, unspecified: Secondary | ICD-10-CM | POA: Diagnosis not present

## 2016-11-24 DIAGNOSIS — Z471 Aftercare following joint replacement surgery: Secondary | ICD-10-CM | POA: Diagnosis not present

## 2016-11-24 DIAGNOSIS — Z882 Allergy status to sulfonamides status: Secondary | ICD-10-CM | POA: Diagnosis not present

## 2016-11-24 DIAGNOSIS — Z96642 Presence of left artificial hip joint: Secondary | ICD-10-CM | POA: Diagnosis not present

## 2016-11-25 ENCOUNTER — Encounter: Payer: Self-pay | Admitting: Family Medicine

## 2016-11-25 ENCOUNTER — Ambulatory Visit (INDEPENDENT_AMBULATORY_CARE_PROVIDER_SITE_OTHER): Payer: Medicare Other | Admitting: Family Medicine

## 2016-11-25 VITALS — BP 142/81 | HR 76 | Temp 97.9°F | Resp 16 | Ht 62.0 in | Wt 133.8 lb

## 2016-11-25 DIAGNOSIS — N3000 Acute cystitis without hematuria: Secondary | ICD-10-CM

## 2016-11-25 DIAGNOSIS — N390 Urinary tract infection, site not specified: Secondary | ICD-10-CM | POA: Diagnosis not present

## 2016-11-25 LAB — POCT URINALYSIS DIPSTICK
BILIRUBIN UA: NEGATIVE
Blood, UA: NEGATIVE
Glucose, UA: NEGATIVE
Ketones, UA: NEGATIVE
LEUKOCYTES UA: NEGATIVE
PH UA: 6.5
PROTEIN UA: NEGATIVE
Spec Grav, UA: 1.015
UROBILINOGEN UA: 0.2

## 2016-11-25 MED ORDER — CIPROFLOXACIN HCL 500 MG PO TABS: ORAL_TABLET | ORAL | 0 refills | Status: DC

## 2016-11-25 NOTE — Progress Notes (Signed)
OFFICE VISIT  11/25/2016   CC:  Chief Complaint  Patient presents with  . Urinary Tract Infection    has frequent UTI's, she stated that she took a home test which showed that she may have a UTI, she stated that she does not have any symptoms but is going out of town and would like to be sure she doesn't have a UTI    HPI:    Patient is a 81 y.o.  female who presents for concern for possible new UTI. She is going out of town tomorrow and she wanted to get an idea of she had a UTI or not, so she took a home test and she is not sure if it was positive or not.  She decided to come in for UA today just to be sure. Has hx of frequent UTI---about 6 per year.  They occur more often when she travels. Says cipro usually helps.  She asks for a few pills of this to travel with even if she doesn't have a UTI today. Has hx of "infection when I had no symptoms".  She has not taken any otc azo.  Mild lower abd discomfort last night.  No dysuria.  No urinary urgency or frequency.  No gross.  Past Medical History:  Diagnosis Date  . Actinic keratosis 08/04/2011  . Allergy    seasonal  . Arthritis    right hand supplement  . Back pain   . Cervical cancer screening 07/10/2012  . Cystic disease of breast   . Depression   . Frequent UTI 08/04/2011  . Headache(784.0)   . Hyperlipidemia   . Left ankle sprain 05/22/2012  . Measles   . Mumps as a child  . Osteoporosis    started taking meds at age 54 then just went off recently- due to conflict she has heard  . Preventative health care 08/04/2011  . Skin lesion of left leg 07/10/2012  . Thyroid disease    s/p thyroidectomy, goiter  . Whooping cough as a child    Past Surgical History:  Procedure Laterality Date  . ABDOMINAL HYSTERECTOMY  1982   partial still has ovaries, uterine prolapse and menorraghia  . APPENDECTOMY    . BREAST SURGERY     breast biopsies, b/l benign  . removed part of thyroid  2002  . TEE WITHOUT CARDIOVERSION   09/16/2011   Procedure: TRANSESOPHAGEAL ECHOCARDIOGRAM (TEE);  Surgeon: Peter Martinique, MD;  Location[facility] ENDOSCOPY;  Service: Cardiovascular;  Laterality: N/A;  . TOTAL HIP ARTHROPLASTY Left     Outpatient Medications Prior to Visit  Medication Sig Dispense Refill  . aspirin 325 MG tablet Take 325 mg by mouth daily.    Marland Kitchen atorvastatin (LIPITOR) 10 MG tablet Take 1 tablet (10 mg total) by mouth daily. 90 tablet 0  . levothyroxine (SYNTHROID, LEVOTHROID) 75 MCG tablet Take 1 tablet (75 mcg total) by mouth daily. 90 tablet 0  . loperamide (IMODIUM A-D) 2 MG tablet Take 1 mg by mouth 2 (two) times daily as needed for diarrhea or loose stools.    . Probiotic Product (PROBIOTIC ADVANCED PO) Take 1 capsule by mouth daily.     Marland Kitchen dicyclomine (BENTYL) 10 MG capsule Take 1 capsule (10 mg total) by mouth 3 (three) times daily before meals. (Patient not taking: Reported on 11/25/2016) 90 capsule 1  . LORazepam (ATIVAN) 0.5 MG tablet Take 1 tablet (0.5 mg total) by mouth every 8 (eight) hours as needed for anxiety. (Patient not taking:  Reported on 11/25/2016) 90 tablet 0  . ondansetron (ZOFRAN) 4 MG tablet Take 1 tablet (4 mg total) by mouth every 8 (eight) hours as needed for nausea or vomiting. (Patient not taking: Reported on 11/25/2016) 30 tablet 0  . oxyCODONE-acetaminophen (PERCOCET/ROXICET) 5-325 MG tablet Take 0.5 tablets by mouth every 6 (six) hours as needed for severe pain. (Patient not taking: Reported on 11/25/2016) 10 tablet 0   Facility-Administered Medications Prior to Visit  Medication Dose Route Frequency Provider Last Rate Last Dose  . 0.9 %  sodium chloride infusion  500 mL Intravenous Continuous Manus Gunning, MD        Allergies  Allergen Reactions  . Sulfa Antibiotics Other (See Comments)    Unknown (child)     ROS As per HPI  PE: Blood pressure (!) 142/81, pulse 76, temperature 97.9 F (36.6 C), temperature source Oral, resp. rate 16, height 5\' 2"  (1.575 m), weight 133 lb 12  oz (60.7 kg), SpO2 96 %. Gen: Alert, well appearing.  Patient is oriented to person, place, time, and situation. AFFECT: pleasant, lucid thought and speech. No further exam today.  LABS:  CC UA today: nitrite positive, otherwise normal.  IMPRESSION AND PLAN:  Abnormal UA in asymptomatic woman with history of recurrent UTI. Could be false + nitrite, but with her history of recurrent UTI I will start empiric antibiotic and send urine for c/s.  Cipro 500mg  bid x 3d. I did give her an additional 3 days of cipro that she will take with her on her trip to [place] in case she develops sx's of a UTI while there.  An After Visit Summary was printed and given to the patient.  FOLLOW UP: Return if symptoms worsen or fail to improve.  Signed:  Crissie Sickles, MD           11/25/2016

## 2016-11-25 NOTE — Progress Notes (Signed)
Pre visit review using our clinic review tool, if applicable. No additional management support is needed unless otherwise documented below in the visit note. 

## 2016-11-28 LAB — URINE CULTURE

## 2016-12-02 ENCOUNTER — Telehealth: Payer: Self-pay | Admitting: Family Medicine

## 2016-12-02 NOTE — Telephone Encounter (Signed)
Patient says she missed a call regarding her lab results.  She would like a call back to discuss.  Thank you,  -LL

## 2016-12-02 NOTE — Telephone Encounter (Signed)
Pt advised of lab results and voiced understanding. °

## 2016-12-16 DIAGNOSIS — R300 Dysuria: Secondary | ICD-10-CM | POA: Diagnosis not present

## 2017-01-12 ENCOUNTER — Encounter: Payer: Self-pay | Admitting: Family Medicine

## 2017-01-12 ENCOUNTER — Other Ambulatory Visit: Payer: Self-pay | Admitting: Family Medicine

## 2017-01-12 ENCOUNTER — Ambulatory Visit (INDEPENDENT_AMBULATORY_CARE_PROVIDER_SITE_OTHER): Payer: Medicare Other | Admitting: Family Medicine

## 2017-01-12 VITALS — BP 139/89 | HR 72 | Temp 98.6°F | Resp 20 | Wt 136.5 lb

## 2017-01-12 DIAGNOSIS — R350 Frequency of micturition: Secondary | ICD-10-CM

## 2017-01-12 DIAGNOSIS — E7849 Other hyperlipidemia: Secondary | ICD-10-CM

## 2017-01-12 DIAGNOSIS — N390 Urinary tract infection, site not specified: Secondary | ICD-10-CM

## 2017-01-12 DIAGNOSIS — E785 Hyperlipidemia, unspecified: Secondary | ICD-10-CM | POA: Diagnosis not present

## 2017-01-12 DIAGNOSIS — E039 Hypothyroidism, unspecified: Secondary | ICD-10-CM | POA: Diagnosis not present

## 2017-01-12 LAB — POC URINALSYSI DIPSTICK (AUTOMATED)
Bilirubin, UA: NEGATIVE
GLUCOSE UA: NEGATIVE
KETONES UA: NEGATIVE
Nitrite, UA: NEGATIVE
Protein, UA: NEGATIVE
Urobilinogen, UA: 0.2 E.U./dL
pH, UA: 6 (ref 5.0–8.0)

## 2017-01-12 LAB — TSH: TSH: 5.53 u[IU]/mL — ABNORMAL HIGH (ref 0.35–4.50)

## 2017-01-12 MED ORDER — CIPROFLOXACIN HCL 500 MG PO TABS: 500.0000 mg | ORAL_TABLET | Freq: Two times a day (BID) | ORAL | 0 refills | Status: DC

## 2017-01-12 NOTE — Progress Notes (Signed)
Molly Fleming , [birth year], 81 y.o., female MRN: [id number] Patient Care Team    Relationship Specialty Notifications Start End  Molly Hillock, DO PCP - General Family Medicine  02/25/16   Molly Maudlin, MD Consulting Physician Orthopedic Surgery  02/25/16     CC: recurrent UTI symptoms Subjective:   Recurrent UTI: Patient presents for office appointment for recurrent urinary tract infection. She's had multiple urinary tract infections over the last year. She reports she did have a history of prolapsed bladder. She reports her infections are usually responsive to Cipro. She endorses urinary frequency and burning with urination. She denies fever, chills or low back pain.  Hyperlipidemia: Patient has a history of hyperlipidemia, prescribed Lipitor 10 mg daily. She reports compliance with this medication. Last lipid panel just over a year ago.  Hypothyroidism: She has a history of hypothyroidism. Prescribe Synthroid 75 g daily. She reports compliance with this medication today. She is asking for refills today. TSH is over a year and half ago. She denies fatigue, bowel changes, weight loss or palpitations.  Depression screen [facility] 2/9 02/25/2016 02/17/2015 01/03/2015  Decreased Interest 0 0 0  Down, Depressed, Hopeless 0 0 0  PHQ - 2 Score 0 0 0    Allergies  Allergen Reactions  . Sulfa Antibiotics Other (See Comments)    Unknown (child)    Social History  Substance Use Topics  . Smoking status: Never Smoker  . Smokeless tobacco: Never Used  . Alcohol use No   Past Medical History:  Diagnosis Date  . Actinic keratosis 08/04/2011  . Allergy    seasonal  . Arthritis    right hand supplement  . Back pain   . Cervical cancer screening 07/10/2012  . Cystic disease of breast   . Depression   . Frequent UTI 08/04/2011  . Headache(784.0)   . Hyperlipidemia   . Left ankle sprain 05/22/2012  . Measles   . Mumps as a child  . Osteoporosis    started taking meds at age 81 then just  went off recently- due to conflict she has heard  . Preventative health care 08/04/2011  . Skin lesion of left leg 07/10/2012  . Thyroid disease    s/p thyroidectomy, goiter  . Whooping cough as a child   Past Surgical History:  Procedure Laterality Date  . ABDOMINAL HYSTERECTOMY  1982   partial still has ovaries, uterine prolapse and menorraghia  . APPENDECTOMY    . BREAST SURGERY     breast biopsies, b/l benign  . removed part of thyroid  2002  . TEE WITHOUT CARDIOVERSION  09/16/2011   Procedure: TRANSESOPHAGEAL ECHOCARDIOGRAM (TEE);  Surgeon: Molly Martinique, MD;  Location: [facility] ENDOSCOPY;  Service: Cardiovascular;  Laterality: N/A;  . TOTAL HIP ARTHROPLASTY Left    Family History  Problem Relation Age of Onset  . Adopted: Yes  . Cancer Mother   . Heart attack Father   . Heart disease Father   . Cancer Brother     colon  . Colon cancer Brother   . Anxiety disorder Son    Allergies as of 01/12/2017      Reactions   Sulfa Antibiotics Other (See Comments)   Unknown (child)       Medication List       Accurate as of 01/12/17  1:46 PM. Always use your most recent med list.          aspirin 325 MG tablet Take 325 mg by mouth daily.  atorvastatin 10 MG tablet Commonly known as:  LIPITOR Take 1 tablet (10 mg total) by mouth daily.   levothyroxine 75 MCG tablet Commonly known as:  SYNTHROID, LEVOTHROID Take 1 tablet (75 mcg total) by mouth daily.   loperamide 2 MG tablet Commonly known as:  IMODIUM A-D Take 1 mg by mouth 2 (two) times daily as needed for diarrhea or loose stools.   PROBIOTIC ADVANCED PO Take 1 capsule by mouth daily.       Results for orders placed or performed in visit on 01/12/17 (from the past 24 hour(s))  POCT Urinalysis Dipstick (Automated)     Status: Abnormal   Collection Time: 01/12/17  1:31 PM  Result Value Ref Range   Color, UA yellow    Clarity, UA clear    Glucose, UA negative    Bilirubin, UA negative    Ketones, UA negative      Spec Grav, UA <=1.005 (A) 1.010 - 1.025   Blood, UA trace-lysed    pH, UA 6.0 5.0 - 8.0   Protein, UA negative    Urobilinogen, UA 0.2 0.2 or 1.0 E.U./dL   Nitrite, UA negative    Leukocytes, UA Small (1+) (A) Negative   No results found.   ROS: Negative, with the exception of above mentioned in HPI   Objective:  BP 139/89 (BP Location: Left Arm, Patient Position: Sitting, Cuff Size: Normal)   Pulse 72   Temp 98.6 F (37 C)   Resp 20   Wt 136 lb 8 oz (61.9 kg)   SpO2 98%   BMI 24.97 kg/m  Body mass index is 24.97 kg/m. Gen: Afebrile. No acute distress. Nontoxic in appearance, well developed, well nourished.  HENT: AT. [place].  MMM Eyes:Pupils Equal Round Reactive to light, Extraocular movements intact,  Conjunctiva without redness, discharge or icterus. Neck/lymp/endocrine: Supple,no lymphadenopathy, no thyromegaly CV: RRR no murmur, no edema Chest: CTAB, no wheeze or crackles. Good air movement, normal resp effort.  Abd: Soft. NTND. BS present. No  Masses palpated. No rebound or guarding.  MSK: no CVA tenderness.  Neuro: Normal gait. PERLA. EOMi. Alert. Oriented x3  Psych: Normal affect, dress and demeanor. Normal speech. Normal thought content and judgment.  Results for orders placed or performed in visit on 01/12/17 (from the past 24 hour(s))  POCT Urinalysis Dipstick (Automated)     Status: Abnormal   Collection Time: 01/12/17  1:31 PM  Result Value Ref Range   Color, UA yellow    Clarity, UA clear    Glucose, UA negative    Bilirubin, UA negative    Ketones, UA negative    Spec Grav, UA <=1.005 (A) 1.010 - 1.025   Blood, UA trace-lysed    pH, UA 6.0 5.0 - 8.0   Protein, UA negative    Urobilinogen, UA 0.2 0.2 or 1.0 E.U./dL   Nitrite, UA negative    Leukocytes, UA Small (1+) (A) Negative    Assessment/Plan: Molly Fleming is a 81 y.o. female present for OV for  Urinary frequency/Recurrent UTI - POCT Urinalysis Dipstick (Automated) - Ambulatory referral  to Urology - > 8 this past year. Discussed uro referral and pt agreeable.  - Cipro prescribed, 5 day course.   Hypothyroidism, unspecified type - last TSH about 2 years ago. Repeat level today. Refills will be provided have labs resulted.  - TSH - follow up yearly.   Hyperlipidemia, unspecified hyperlipidemia type Refills on Lipitor 10 mg provided, follow-up yearly with lipid panel.  Reviewed expectations re: course of current medical issues.  Discussed self-management of symptoms.  Outlined signs and symptoms indicating need for more acute intervention.  Patient verbalized understanding and all questions were answered.  Patient received an After-Visit Summary.   electronically signed by:  Howard Pouch, DO  [place]

## 2017-01-12 NOTE — Patient Instructions (Addendum)
Great to see you doing so well!  I have called in cipro for 5 days. I have also referred you to a urologist for your frequent UTI.    I will refill your synthroid after we get the results back from your lab collection.     Please help [place] help you:  We are honored you have chosen [facility] for your [facility] home. Below you will find basic instructions that you may need to access in the future. Please help [place] help you by reading the instructions, which cover many of the frequent questions we experience.   Prescription refills and request:  -In order to allow more efficient response time, please call your pharmacy for all refills. They will forward the request electronically to [place]. This allows for the quickest possible response. Request left on a nurse line can take longer to refill, since these are checked as time allows between office patients and other phone calls.  - refill request can take up to 3-5 working days to complete.  - If request is sent electronically and request is appropiate, it is usually completed in 1-2 business days.  - all patients will need to be seen routinely for all chronic medical conditions requiring prescription medications (see follow-up below). If you are overdue for follow up on your condition, you will be asked to make an appointment and we will call in enough medication to cover you until your appointment (up to 30 days).  - all controlled substances will require a face to face visit to request/refill.  - if you desire your prescriptions to go through a new pharmacy, and have an active script at original pharmacy, you will need to call your pharmacy and have scripts transferred to new pharmacy. This is completed between the pharmacy locations and not by your provider.    Results: If any images or labs were ordered, it can take up to 1 week to get results depending on the test ordered and the lab/facility running and resulting the test. - Normal or  stable results, which do not need further discussion, will be released to your mychart immediately with attached note to you. A call will not be generated for normal results. Please make certain to sign up for mychart. If you have questions on how to activate your mychart you can call the front office.  - If your results need further discussion, our office will attempt to contact you via phone, and if unable to reach you after 2 attempts, we will release your abnormal result to your mychart with instructions.  - All results will be automatically released in mychart after 1 week.  - Your provider will provide you with explanation and instruction on all relevant material in your results. Please keep in mind, results and labs may appear confusing or abnormal to the untrained eye, but it does not mean they are actually abnormal for you personally. If you have any questions about your results that are not covered, or you desire more detailed explanation than what was provided, you should make an appointment with your provider to do so.   Our office handles many outgoing and incoming calls daily. If we have not contacted you within 1 week about your results, please check your mychart to see if there is a message first and if not, then contact our office.  In helping with this matter, you help decrease call volume, and therefore allow [place] to be able to respond to patients needs more  efficiently.   Acute office visits (sick visit):  An acute visit is intended for a new problem and are scheduled in shorter time slots to allow schedule openings for patients with new problems. This is the appropriate visit to discuss a new problem. In order to provide you with excellent quality medical care with proper time for you to explain your problem, have an exam and receive treatment with instructions, these appointments should be limited to one new problem per visit. If you experience a new problem, in which you desire to be  addressed, please make an acute office visit, we save openings on the schedule to accommodate you. Please do not save your new problem for any other type of visit, let us take care of it properly and quickly for you.   Follow up visits:  Depending on your condition(s) your provider will need to see you routinely in order to provide you with quality care and prescribe medication(s). Most chronic conditions (Example: hypertension, Diabetes, depression/anxiety... etc), require visits a couple times a year. Your provider will instruct you on proper follow up for your personal medical conditions and history. Please make certain to make follow up appointments for your condition as instructed. Failing to do so could result in lapse in your medication treatment/refills. If you request a refill, and are overdue to be seen on a condition, we will always provide you with a 30 day script (once) to allow you time to schedule.    Medicare wellness (well visit): - we have a wonderful Nurse Maudie Mercury), that will meet with you and provide you will yearly medicare wellness visits. These visits should occur yearly (can not be scheduled less than 1 calendar year apart) and cover preventive health, immunizations, advance directives and screenings you are entitled to yearly through your medicare benefits. Do not miss out on your entitled benefits, this is when medicare will pay for these benefits to be ordered for you.  These are strongly encouraged by your provider and is the appropriate type of visit to make certain you are up to date with all preventive health benefits. If you have not had your medicare wellness exam in the last 12 months, please make certain to schedule one by calling the office and schedule your medicare wellness with Maudie Mercury as soon as possible.   Yearly physical (well visit):  - Adults are recommended to be seen yearly for physicals. Check with your insurance and date of your last physical, most insurances  require one calendar year between physicals. Physicals include all preventive health topics, screenings, medical exam and labs that are appropriate for gender/age and history. You may have fasting labs needed at this visit. This is a well visit (not a sick visit), acute topics should not be covered during this visit.  - Pediatric patients are seen more frequently when they are younger. Your provider will advise you on well child visit timing that is appropriate for your their age. - This is not a medicare wellness visit. Medicare wellness exams do not have an exam portion to the visit. Some medicare companies allow for a physical, some do not allow a yearly physical. If your medicare allows a yearly physical you can schedule the medicare wellness with our nurse Maudie Mercury and have your physical with your provider after, on the same day. Please check with insurance for your full benefits.   Late Policy/No Shows:  - all new patients should arrive 15-30 minutes earlier than appointment to allow [place] time  to  obtain all personal demographics,  insurance information and for you to complete office paperwork. - All established patients should arrive 10-15 minutes earlier than appointment time to update all information and be checked in .  - In our best efforts to run on time, if you are late for your appointment you will be asked to either reschedule or if able, we will work you back into the schedule. There will be a wait time to work you back in the schedule,  depending on availability.  - If you are unable to make it to your appointment as scheduled, please call 24 hours ahead of time to allow [place] to fill the time slot with someone else who needs to be seen. If you do not cancel your appointment ahead of time, you may be charged a no show fee.

## 2017-01-13 ENCOUNTER — Telehealth: Payer: Self-pay | Admitting: Family Medicine

## 2017-01-13 DIAGNOSIS — R7989 Other specified abnormal findings of blood chemistry: Secondary | ICD-10-CM

## 2017-01-13 NOTE — Telephone Encounter (Signed)
Please call patient: Her thyroid test came back mildly elevated above normal. She is not symptomatic. Given that is just barely elevated, I would like her to continue on the same dose of Synthroid 75 g daily. Mild Variations can be normal and self resolve. Please make sure she is taking this on an empty stomach. We will need to retest her thyroid levels and 6-8 weeks.  - Refills called in for her. - I placed a future order for the thyroid panel to be collected. If possible I would like her to come in 2 days prior to her provider appointment to have labs drawn, so we may review the results during her visit.

## 2017-01-13 NOTE — Telephone Encounter (Signed)
Left detailed message with results and instructions on patient voice mail per DPR 

## 2017-01-14 ENCOUNTER — Telehealth: Payer: Self-pay | Admitting: Family Medicine

## 2017-01-14 LAB — URINE CULTURE: ORGANISM ID, BACTERIA: NO GROWTH

## 2017-01-14 NOTE — Telephone Encounter (Signed)
Please call Molly Fleming: - her urine did not show evidence of infection on culture there was no growth of bacteria at all. - we did put in a urology referral in for her at her last appt. I would recommend she she makes sure to schedule the appt at her earliest conveniences when they do call, to further investigate cause of her discomfort.

## 2017-01-14 NOTE — Telephone Encounter (Signed)
Left detailed message with results and instructions on patient voice mail per DPR 

## 2017-01-18 ENCOUNTER — Telehealth: Payer: Self-pay

## 2017-01-18 NOTE — Telephone Encounter (Signed)
LM requesting call back to schedule AWV with [facility].  

## 2017-03-10 DIAGNOSIS — N3021 Other chronic cystitis with hematuria: Secondary | ICD-10-CM | POA: Diagnosis not present

## 2017-03-10 DIAGNOSIS — N8111 Cystocele, midline: Secondary | ICD-10-CM | POA: Diagnosis not present

## 2017-03-10 DIAGNOSIS — N952 Postmenopausal atrophic vaginitis: Secondary | ICD-10-CM | POA: Diagnosis not present

## 2017-03-11 ENCOUNTER — Encounter: Payer: Self-pay | Admitting: Family Medicine

## 2017-03-11 ENCOUNTER — Ambulatory Visit (INDEPENDENT_AMBULATORY_CARE_PROVIDER_SITE_OTHER): Payer: Medicare Other | Admitting: Family Medicine

## 2017-03-11 VITALS — BP 143/82 | HR 69 | Temp 97.7°F | Resp 20 | Wt 135.0 lb

## 2017-03-11 DIAGNOSIS — R7309 Other abnormal glucose: Secondary | ICD-10-CM

## 2017-03-11 DIAGNOSIS — R251 Tremor, unspecified: Secondary | ICD-10-CM

## 2017-03-11 DIAGNOSIS — R531 Weakness: Secondary | ICD-10-CM | POA: Diagnosis not present

## 2017-03-11 DIAGNOSIS — E039 Hypothyroidism, unspecified: Secondary | ICD-10-CM | POA: Diagnosis not present

## 2017-03-11 DIAGNOSIS — M810 Age-related osteoporosis without current pathological fracture: Secondary | ICD-10-CM | POA: Diagnosis not present

## 2017-03-11 LAB — COMPREHENSIVE METABOLIC PANEL
ALT: 18 U/L (ref 0–35)
AST: 18 U/L (ref 0–37)
Albumin: 4.7 g/dL (ref 3.5–5.2)
Alkaline Phosphatase: 67 U/L (ref 39–117)
BUN: 18 mg/dL (ref 6–23)
CHLORIDE: 106 meq/L (ref 96–112)
CO2: 25 meq/L (ref 19–32)
CREATININE: 0.84 mg/dL (ref 0.40–1.20)
Calcium: 9.5 mg/dL (ref 8.4–10.5)
GFR: 69.61 mL/min (ref >60.00)
Glucose, Bld: 90 mg/dL (ref 70–99)
POTASSIUM: 4.7 meq/L (ref 3.5–5.1)
SODIUM: 140 meq/L (ref 135–145)
Total Bilirubin: 0.3 mg/dL (ref 0.2–1.2)
Total Protein: 7.1 g/dL (ref 6.0–8.3)

## 2017-03-11 LAB — CBC WITH DIFFERENTIAL/PLATELET
BASOS PCT: 0.9 % (ref 0.0–3.0)
Basophils Absolute: 0.1 10*3/uL (ref 0.0–0.1)
EOS ABS: 0.1 10*3/uL (ref 0.0–0.7)
Eosinophils Relative: 1.7 % (ref 0.0–5.0)
HCT: 41.4 % (ref 36.0–46.0)
Hemoglobin: 13.8 g/dL (ref 12.0–15.0)
LYMPHS ABS: 3 10*3/uL (ref 0.7–4.0)
Lymphocytes Relative: 47.2 % — ABNORMAL HIGH (ref 12.0–46.0)
MCHC: 33.2 g/dL (ref 30.0–36.0)
MCV: 89.3 fl (ref 78.0–100.0)
MONO ABS: 0.4 10*3/uL (ref 0.1–1.0)
Monocytes Relative: 6.9 % (ref 3.0–12.0)
NEUTROS ABS: 2.7 10*3/uL (ref 1.4–7.7)
NEUTROS PCT: 43.3 % (ref 43.0–77.0)
PLATELETS: 221 10*3/uL (ref 150.0–400.0)
RBC: 4.64 Mil/uL (ref 3.87–5.11)
RDW: 13.7 % (ref 11.5–15.5)
WBC: 6.3 10*3/uL (ref 4.0–10.5)

## 2017-03-11 LAB — TSH: TSH: 1.11 u[IU]/mL (ref 0.35–4.50)

## 2017-03-11 LAB — T3, FREE: T3 FREE: 3.5 pg/mL (ref 2.3–4.2)

## 2017-03-11 LAB — T4, FREE: Free T4: 0.93 ng/dL (ref 0.60–1.60)

## 2017-03-11 NOTE — Progress Notes (Signed)
Molly Fleming , 1936-07-29, 81 y.o., female MRN: [id number] Patient Care Team    Relationship Specialty Notifications Start End  [name], DO PCP - General Family Medicine  02/25/16   [name], MD Consulting Physician Orthopedic Surgery  02/25/16     Chief Complaint  Patient presents with  . Nausea    shaky and fatigue intermittant x 6 weeks history hypoglycemic     Subjective: Molly Fleming is a 81 y.o. female Pt presents to office today with complaints of weakness, shakiness that has been occurring daily for the last 6 weeks. She denies headache, fever, chills, bowel changes, chest pain , palpitations,  shortness of breath, ringing in ears, visual changes, dizziness or Lightheadedness. She endorses having symptoms every morning that she describes as a shakiness through her entire body, which leads to generalized weakness causing her to need to lay down. She endorses intermittent nausea over the course of the same timeline. She has a significant h/o hypoglycemia with syncope in the past, thyroid disease with mild elevation in TSH 6 weeks ago.  She also feels like her anxiety is worse since feeling this way.  She has been cutting all refined sugar from her diet. She tries to aviod most starchy carbs, but does eat fruit and pasta occassionally. She has started eating every 3 hours, and that can help, but not resolve the issue. She has chronic diarrhea, in which she has seen GI and is taking lopermide (about 1-2 g a day). She has had a full urologic workup by urology (yesterday) and there is no contributing causes, work up was normal (per pt).    Depression screen [facility] 2/9 02/25/2016 02/17/2015 01/03/2015  Decreased Interest 0 0 0  Down, Depressed, Hopeless 0 0 0  PHQ - 2 Score 0 0 0    Allergies  Allergen Reactions  . Sulfa Antibiotics Other (See Comments)    Unknown (child)    Social History  Substance Use Topics  . Smoking status: Never Smoker  . Smokeless tobacco:  Never Used  . Alcohol use No   Past Medical History:  Diagnosis Date  . Actinic keratosis 08/04/2011  . Allergy    seasonal  . Arthritis    right hand supplement  . Back pain   . Cervical cancer screening 07/10/2012  . Cystic disease of breast   . Depression   . Frequent UTI 08/04/2011  . Headache(784.0)   . Hyperlipidemia   . Left ankle sprain 05/22/2012  . Measles   . Mumps as a child  . Osteoporosis    started taking meds at age 33 then just went off recently- due to conflict she has heard  . Preventative health care 08/04/2011  . Skin lesion of left leg 07/10/2012  . Thyroid disease    s/p thyroidectomy, goiter  . Whooping cough as a child   Past Surgical History:  Procedure Laterality Date  . ABDOMINAL HYSTERECTOMY  1982   partial still has ovaries, uterine prolapse and menorraghia  . APPENDECTOMY    . BREAST SURGERY     breast biopsies, b/l benign  . removed part of thyroid  2002  . TEE WITHOUT CARDIOVERSION  09/16/2011   Procedure: TRANSESOPHAGEAL ECHOCARDIOGRAM (TEE);  Surgeon: Peter Martinique, MD;  Location: [facility] ENDOSCOPY;  Service: Cardiovascular;  Laterality: N/A;  . TOTAL HIP ARTHROPLASTY Left    Family History  Problem Relation Age of Onset  . Adopted: Yes  . Cancer Mother   . Heart  attack Father   . Heart disease Father   . Cancer Brother        colon  . Colon cancer Brother   . Anxiety disorder Son    Allergies as of 03/11/2017      Reactions   Sulfa Antibiotics Other (See Comments)   Unknown (child)       Medication List       Accurate as of 03/11/17  1:11 PM. Always use your most recent med list.          aspirin 325 MG tablet Take 325 mg by mouth daily.   atorvastatin 10 MG tablet Commonly known as:  LIPITOR TAKE ONE TABLET BY MOUTH ONCE DAILY **NEEDS  OFFICE  VISIT  PRIOR  TO  ANY  MORE  REFILLS**   CALCIUM 1200 PO Take 1 capsule by mouth daily.   levothyroxine 75 MCG tablet Commonly known as:  SYNTHROID, LEVOTHROID TAKE ONE TABLET  BY MOUTH ONCE DAILY   loperamide 2 MG tablet Commonly known as:  IMODIUM A-D Take 1 mg by mouth 2 (two) times daily as needed for diarrhea or loose stools.   PROBIOTIC ADVANCED PO Take 1 capsule by mouth daily.       All past medical history, surgical history, allergies, family history, immunizations andmedications were updated in the EMR today and reviewed under the history and medication portions of their EMR.     ROS: Negative, with the exception of above mentioned in HPI   Objective:  BP (!) 143/82 (BP Location: Right Arm, Patient Position: Sitting, Cuff Size: Normal)   Pulse 69   Temp 97.7 F (36.5 C)   Resp 20   Wt 135 lb (61.2 kg)   SpO2 99%   BMI 24.69 kg/m  Body mass index is 24.69 kg/m. Gen: Afebrile. No acute distress. Nontoxic in appearance, well developed, well nourished. Very pleasant caucasian female.  HENT: AT. [place]. MMM, no oral lesions.  Eyes:Pupils Equal Round Reactive to light, Extraocular movements intact,  Conjunctiva without redness, discharge or icterus. Neck/lymp/endocrine: Supple,no lymphadenopathy, no thyromegaly.  CV: RRR no murmur, no edema, no carotid bruit.  Chest: CTAB, no wheeze or crackles. Good air movement, normal resp effort.  Abd: Soft. NTND. BS present.   Neuro:  Normal gait. PERLA. EOMi. Alert. Oriented x3  Psych: Normal affect, dress and demeanor. Normal speech. Normal thought content and judgment.  No exam data present No results found. No results found for this or any previous visit (from the past 24 hour(s)).  Assessment/Plan: Molly Fleming is a 81 y.o. female present for OV for  Weakness/Shakiness/history of hypoglycemia - Discussion with patient a surrounding possible causes of her condition is rather fast. We'll start with ruling out thyroid, electrolyte imbalance, diabetes or anemia as potential causes. - Patient is to start back healthy carbohydrates such as vegetables, sweet potato, wheat or whole grain pasta/rice.  -  Consume small meals multiple times a day, every 2 hours if needed. - Patient did have a partial thyroidectomy, and TSH was minimally elevated approximately 2 months ago, will repeat thyroid panel and collect PTH. - Patient still with diarrhea, may benefit from probiotics. - If workup is uneventful, could consider endocrine referral. - Discussed with patient if her electrolytes are off, or sugar is dropping this can gets feelings of anxiety/nervousness, thyroid, tibia cause for depression/anxiety feelings. If labs are normal we could discuss restarting SSRI, she had been on Paxil in the past. Patient is agreeable to this plan today. -  CBC w/Diff - Comp Met (CMET) - TSH - T3, free - T4, free - PTH, Intact and Calcium - Hemoglobin A1c - Follow-up depending upon laboratory results.  Reviewed expectations re: course of current medical issues.  Discussed self-management of symptoms.  Outlined signs and symptoms indicating need for more acute intervention.  Patient verbalized understanding and all questions were answered.  Patient received an After-Visit Summary.     Note is dictated utilizing voice recognition software. Although note has been proof read prior to signing, occasional typographical errors still can be missed. If any questions arise, please do not hesitate to call for verification.   electronically signed by:  Howard Pouch, DO  [place]

## 2017-03-11 NOTE — Patient Instructions (Signed)
I will call you with labs once available.  In the meantime start adding more " good" carbs in the diet and eating every 2 hours.    Sweet potatoes, whole wheat carbs (pasta, breads), berries and whole grain rice. Vegetables. Etc....     Hypoglycemia Hypoglycemia is when the sugar (glucose) level in the blood is too low. Symptoms of low blood sugar may include:  Feeling: ? Hungry. ? Worried or nervous (anxious). ? Sweaty and clammy. ? Confused. ? Dizzy. ? Sleepy. ? Sick to your stomach (nauseous).  Having: ? A fast heartbeat. ? A headache. ? A change in your vision. ? Jerky movements that you cannot control (seizure). ? Nightmares. ? Tingling or no feeling (numbness) around the mouth, lips, or tongue.  Having trouble with: ? Talking. ? Paying attention (concentrating). ? Moving (coordination). ? Sleeping.  Shaking.  Passing out (fainting).  Getting upset easily (irritability).  Low blood sugar can happen to people who have diabetes and people who do not have diabetes. Low blood sugar can happen quickly, and it can be an emergency. Treating Low Blood Sugar Low blood sugar is often treated by eating or drinking something sugary right away. If you can think clearly and swallow safely, follow the 15:15 rule:  Take 15 grams of a fast-acting carb (carbohydrate). Some fast-acting carbs are: ? 1 tube of glucose gel. ? 3 sugar tablets (glucose pills). ? 6-8 pieces of hard candy. ? 4 oz (120 mL) of fruit juice. ? 4 oz (120 mL) of regular (not diet) soda.  Check your blood sugar 15 minutes after you take the carb.  If your blood sugar is still at or below 70 mg/dL (3.9 mmol/L), take 15 grams of a carb again.  If your blood sugar does not go above 70 mg/dL (3.9 mmol/L) after 3 tries, get help right away.  After your blood sugar goes back to normal, eat a meal or a snack within 1 hour.  Treating Very Low Blood Sugar If your blood sugar is at or below 54 mg/dL (3 mmol/L),  you have very low blood sugar (severe hypoglycemia). This is an emergency. Do not wait to see if the symptoms will go away. Get medical help right away. Call your local emergency services (911 in the [place] Do not drive yourself to the hospital. If you have very low blood sugar and you cannot eat or drink, you may need a glucagon shot (injection). A family member or friend should learn how to check your blood sugar and how to give you a glucagon shot. Ask your doctor if you need to have a glucagon shot kit at home. Follow these instructions at home: General instructions  Avoid any diets that cause you to not eat enough food. Talk with your doctor before you start any new diet.  Take over-the-counter and prescription medicines only as told by your doctor.  Limit alcohol to no more than 1 drink per day for nonpregnant women and 2 drinks per day for men. One drink equals 12 oz of beer, 5 oz of wine, or 1 oz of hard liquor.  Keep all follow-up visits as told by your doctor. This is important. If You Have Diabetes:   Make sure you know the symptoms of low blood sugar.  Always keep a source of sugar with you, such as: ? Sugar. ? Sugar tablets. ? Glucose gel. ? Fruit juice. ? Regular soda (not diet soda). ? Milk. ? Hard candy. ? Honey.  Take your  medicines as told.  Follow your exercise and meal plan. ? Eat on time. Do not skip meals. ? Follow your sick day plan when you cannot eat or drink normally. Make this plan ahead of time with your doctor.  Check your blood sugar as often as told by your doctor. Always check before and after exercise.  Share your diabetes care plan with: ? Your work or school. ? People you live with.  Check your pee (urine) for ketones: ? When you are sick. ? As told by your doctor.  Carry a card or wear jewelry that says you have diabetes. If You Have Low Blood Sugar From Other Causes:   Check your blood sugar as often as told by your  doctor.  Follow instructions from your doctor about what you cannot eat or drink. Contact a doctor if:  You have trouble keeping your blood sugar in your target range.  You have low blood sugar often. Get help right away if:  You still have symptoms after you eat or drink something sugary.  Your blood sugar is at or below 54 mg/dL (3 mmol/L).  You have jerky movements that you cannot control.  You pass out. These symptoms may be an emergency. Do not wait to see if the symptoms will go away. Get medical help right away. Call your local emergency services (911 in the [place] Do not drive yourself to the hospital. This information is not intended to replace advice given to you by your health care provider. Make sure you discuss any questions you have with your health care provider. Document Released: 12/08/2009 Document Revised: 02/19/2016 Document Reviewed: 10/17/2015 Elsevier Interactive Patient Education  [employer].

## 2017-03-12 LAB — HEMOGLOBIN A1C
Hgb A1c MFr Bld: 5.1 % (ref <5.7)
MEAN PLASMA GLUCOSE: 100 mg/dL

## 2017-03-14 ENCOUNTER — Telehealth: Payer: Self-pay | Admitting: Family Medicine

## 2017-03-14 LAB — PTH, INTACT AND CALCIUM
Calcium: 9.2 mg/dL (ref 8.6–10.4)
PTH: 39 pg/mL (ref 14–64)

## 2017-03-14 NOTE — Telephone Encounter (Signed)
Spoke with patient reviewed information and results. Patient states she will start recommendations on diet and try the align. She states she does not want to start any medication for anxiety at this time she will schedule an appt if she continues to have issues to discuss options at that time. Dr Raoul Pitch aware.

## 2017-03-14 NOTE — Telephone Encounter (Signed)
Please call pt: - all of her labs are normal and look great.  - concerning her shakiness:     - start the healthy carbohydrates as we discussed. Eating small more frequent meals every 2 hours.     - start a probiotic (like ALIGN) daily, this will help with intestinal health and diarrhea.  - concerning anxiety: We can restart the paxil back at same dose or consider lexapro, which is also very well tolerated. Would want to see her back in 4 weeks after med start to follow up on both issues.  Please advise on her choice of medication and I will call it in today.

## 2017-03-29 ENCOUNTER — Other Ambulatory Visit: Payer: Self-pay | Admitting: *Deleted

## 2017-03-29 DIAGNOSIS — E7849 Other hyperlipidemia: Secondary | ICD-10-CM

## 2017-03-29 MED ORDER — LEVOTHYROXINE SODIUM 75 MCG PO TABS: 75.0000 ug | ORAL_TABLET | Freq: Every day | ORAL | 1 refills | Status: DC

## 2017-03-29 MED ORDER — ATORVASTATIN CALCIUM 10 MG PO TABS: ORAL_TABLET | ORAL | 1 refills | Status: DC

## 2017-03-29 NOTE — Telephone Encounter (Signed)
Patient changed pharmacy refills sent to [place] as requested

## 2017-04-01 ENCOUNTER — Telehealth: Payer: Self-pay | Admitting: *Deleted

## 2017-04-01 MED ORDER — PAROXETINE HCL 10 MG PO TABS: 10.0000 mg | ORAL_TABLET | ORAL | 0 refills | Status: DC

## 2017-04-01 NOTE — Telephone Encounter (Signed)
Patient called and said she changed her mind and would like to restart the Paxil . Please advise

## 2017-04-01 NOTE — Telephone Encounter (Signed)
Spoke with patient reviewed information patient will call and schedule appt prior to needing refill

## 2017-04-01 NOTE — Telephone Encounter (Signed)
I have called in the low dose paxil. She will need to follow up before refills needed in 4 weeks to see how she is doing.

## 2017-04-06 ENCOUNTER — Emergency Department (HOSPITAL_COMMUNITY): Payer: Medicare Other

## 2017-04-06 ENCOUNTER — Emergency Department (HOSPITAL_COMMUNITY)
Admission: EM | Admit: 2017-04-06 | Discharge: 2017-04-06 | Disposition: A | Discharge status: Home / Self Care | Payer: Medicare Other | Attending: Emergency Medicine | Admitting: Emergency Medicine

## 2017-04-06 DIAGNOSIS — R300 Dysuria: Secondary | ICD-10-CM | POA: Insufficient documentation

## 2017-04-06 DIAGNOSIS — N3000 Acute cystitis without hematuria: Secondary | ICD-10-CM | POA: Diagnosis not present

## 2017-04-06 DIAGNOSIS — R309 Painful micturition, unspecified: Secondary | ICD-10-CM | POA: Diagnosis present

## 2017-04-06 DIAGNOSIS — Z87440 Personal history of urinary (tract) infections: Secondary | ICD-10-CM | POA: Diagnosis not present

## 2017-04-06 DIAGNOSIS — Z7982 Long term (current) use of aspirin: Secondary | ICD-10-CM | POA: Diagnosis not present

## 2017-04-06 DIAGNOSIS — N2889 Other specified disorders of kidney and ureter: Secondary | ICD-10-CM | POA: Diagnosis not present

## 2017-04-06 DIAGNOSIS — E039 Hypothyroidism, unspecified: Secondary | ICD-10-CM | POA: Diagnosis not present

## 2017-04-06 DIAGNOSIS — Z96642 Presence of left artificial hip joint: Secondary | ICD-10-CM | POA: Diagnosis not present

## 2017-04-06 DIAGNOSIS — N390 Urinary tract infection, site not specified: Secondary | ICD-10-CM | POA: Diagnosis not present

## 2017-04-06 LAB — COMPREHENSIVE METABOLIC PANEL
ALBUMIN: 4.6 g/dL (ref 3.5–5.0)
ALK PHOS: 63 U/L (ref 38–126)
ALT: 25 U/L (ref 14–54)
ANION GAP: 8 (ref 5–15)
AST: 27 U/L (ref 15–41)
BILIRUBIN TOTAL: 0.4 mg/dL (ref 0.3–1.2)
BUN: 18 mg/dL (ref 6–20)
CO2: 28 mmol/L (ref 22–32)
CREATININE: 0.92 mg/dL (ref 0.44–1.00)
Calcium: 9.1 mg/dL (ref 8.9–10.3)
Chloride: 106 mmol/L (ref 101–111)
GFR calc Af Amer: >60 mL/min (ref >60)
GFR calc non Af Amer: 58 mL/min — ABNORMAL LOW (ref >60)
GLUCOSE: 82 mg/dL (ref 65–99)
Potassium: 4.1 mmol/L (ref 3.5–5.1)
SODIUM: 142 mmol/L (ref 135–145)
TOTAL PROTEIN: 7.6 g/dL (ref 6.5–8.1)

## 2017-04-06 LAB — CBC WITH DIFFERENTIAL/PLATELET
BASOS PCT: 0 %
Basophils Absolute: 0 10*3/uL (ref 0.0–0.1)
Eosinophils Absolute: 0.1 10*3/uL (ref 0.0–0.7)
Eosinophils Relative: 1 %
HEMATOCRIT: 41.2 % (ref 36.0–46.0)
HEMOGLOBIN: 13.6 g/dL (ref 12.0–15.0)
LYMPHS ABS: 3.3 10*3/uL (ref 0.7–4.0)
Lymphocytes Relative: 40 %
MCH: 29.6 pg (ref 26.0–34.0)
MCHC: 33 g/dL (ref 30.0–36.0)
MCV: 89.6 fL (ref 78.0–100.0)
MONOS PCT: 9 %
Monocytes Absolute: 0.7 10*3/uL (ref 0.1–1.0)
NEUTROS ABS: 4.2 10*3/uL (ref 1.7–7.7)
NEUTROS PCT: 50 %
Platelets: 265 10*3/uL (ref 150–400)
RBC: 4.6 MIL/uL (ref 3.87–5.11)
RDW: 13.5 % (ref 11.5–15.5)
WBC: 8.4 10*3/uL (ref 4.0–10.5)

## 2017-04-06 LAB — URINALYSIS, ROUTINE W REFLEX MICROSCOPIC
Bilirubin Urine: NEGATIVE
Glucose, UA: NEGATIVE mg/dL
Hgb urine dipstick: NEGATIVE
Ketones, ur: NEGATIVE mg/dL
Nitrite: POSITIVE — AB
PH: 6 (ref 5.0–8.0)
Protein, ur: NEGATIVE mg/dL
RBC / HPF: NONE SEEN RBC/hpf (ref 0–5)
SPECIFIC GRAVITY, URINE: 1.008 (ref 1.005–1.030)

## 2017-04-06 MED ORDER — CEFTRIAXONE SODIUM 1 G IJ SOLR
1.0000 g | Freq: Once | INTRAMUSCULAR | Status: AC
  Administered 2017-04-06: 1 g via INTRAVENOUS
  Filled 2017-04-06: qty 10

## 2017-04-06 MED ORDER — CEPHALEXIN 500 MG PO CAPS: 500.0000 mg | ORAL_CAPSULE | Freq: Three times a day (TID) | ORAL | 0 refills | Status: AC

## 2017-04-06 NOTE — ED Provider Notes (Addendum)
[facility] DEPT Provider Note   CSN: [id number] Arrival date & time: 04/06/17  0709     History   Chief Complaint Chief Complaint  Patient presents with  . Nausea  . recurrent UTI's    HPI Molly Fleming is a 81 y.o. female.Chief complaint is burning with urination, and nausea with shakiness  HPI 81 year old female. Has had recurrent UTIs over the last 6 weeks. Has been treated on 3 different occasions after positive dipstick UAs. All of these treatments have been from 3-10 days with Cipro. She took her last dose yesterday. Symptoms restarted again this morning. She has a urine dipstick at home and states it was positive for "nitrates and leukocytes".  Also states that she has intermittent episodes of shakiness. Her doctor thought this may be anxiety and she was started on paroxetine last week.  Past Medical History:  Diagnosis Date  . Actinic keratosis 08/04/2011  . Allergy    seasonal  . Arthritis    right hand supplement  . Back pain   . Cervical cancer screening 07/10/2012  . Cystic disease of breast   . Depression   . Frequent UTI 08/04/2011  . Headache(784.0)   . Hyperlipidemia   . Left ankle sprain 05/22/2012  . Measles   . Mumps as a child  . Osteoporosis    started taking meds at age 41 then just went off recently- due to conflict she has heard  . Preventative health care 08/04/2011  . Skin lesion of left leg 07/10/2012  . Thyroid disease    s/p thyroidectomy, goiter  . Whooping cough as a child    Patient Active Problem List   Diagnosis Date Noted  . Weakness 03/11/2017  . Shakiness 03/11/2017  . Osteoporosis   . Irritable bowel syndrome with diarrhea 09/07/2016  . Thyroid activity decreased 08/29/2015  . Inflammatory osteoarthritis 02/17/2015  . Recurrent UTI 08/04/2011  . Hypothyroidism   . Hyperlipidemia   . Cystic disease of breast     Past Surgical History:  Procedure Laterality Date  . ABDOMINAL HYSTERECTOMY  1982   partial still has  ovaries, uterine prolapse and menorraghia  . APPENDECTOMY    . BREAST SURGERY     breast biopsies, b/l benign  . removed part of thyroid  2002  . TEE WITHOUT CARDIOVERSION  09/16/2011   Procedure: TRANSESOPHAGEAL ECHOCARDIOGRAM (TEE);  Surgeon: Peter Martinique, MD;  Location: [facility] ENDOSCOPY;  Service: Cardiovascular;  Laterality: N/A;  . TOTAL HIP ARTHROPLASTY Left     OB History    No data available       Home Medications    Prior to Admission medications   Medication Sig Start Date End Date Taking? Authorizing Provider  aspirin 325 MG tablet Take 325 mg by mouth daily with breakfast.    Yes [provider]  atorvastatin (LIPITOR) 10 MG tablet TAKE ONE TABLET BY MOUTH ONCE DAILY Patient taking differently: Take 10 mg by mouth at bedtime.  03/29/17  Yes Kuneff, Renee A, DO  Calcium Carbonate-Vit D-Min (CALCIUM 1200 PO) Take 1 capsule by mouth daily with breakfast.    Yes [provider]  levothyroxine (SYNTHROID, LEVOTHROID) 75 MCG tablet Take 1 tablet (75 mcg total) by mouth daily. 03/29/17  Yes Kuneff, Renee A, DO  loperamide (IMODIUM A-D) 2 MG tablet Take 1 mg by mouth 2 (two) times daily.    Yes [provider]  PARoxetine (PAXIL) 10 MG tablet Take 1 tablet (10 mg total) by mouth every morning.  04/01/17  Yes Kuneff, Renee A, DO  Probiotic Product (PROBIOTIC ADVANCED PO) Take 1 capsule by mouth daily with breakfast.    Yes [provider]  cephALEXin (KEFLEX) 500 MG capsule Take 1 capsule (500 mg total) by mouth 3 (three) times daily. 04/06/17 04/16/17  Tanna Furry, MD    Family History Family History  Problem Relation Age of Onset  . Adopted: Yes  . Cancer Mother   . Heart attack Father   . Heart disease Father   . Cancer Brother        colon  . Colon cancer Brother   . Anxiety disorder Son     Social History Social History  Substance Use Topics  . Smoking status: Never Smoker  . Smokeless tobacco: Never Used  . Alcohol use No      Allergies   Sulfa antibiotics   Review of Systems Review of Systems  Constitutional: Negative for appetite change, chills, diaphoresis, fatigue and fever.       Shakiness   HENT: Negative for mouth sores, sore throat and trouble swallowing.   Eyes: Negative for visual disturbance.  Respiratory: Negative for cough, chest tightness, shortness of breath and wheezing.   Cardiovascular: Negative for chest pain.  Gastrointestinal: Positive for nausea. Negative for abdominal distention, abdominal pain, diarrhea and vomiting.  Endocrine: Negative for polydipsia, polyphagia and polyuria.  Genitourinary: Positive for dysuria. Negative for frequency and hematuria.  Musculoskeletal: Negative for gait problem.  Skin: Negative for color change, pallor and rash.  Neurological: Negative for dizziness, syncope, light-headedness and headaches.  Hematological: Does not bruise/bleed easily.  Psychiatric/Behavioral: Negative for behavioral problems and confusion.     Physical Exam Updated Vital Signs BP 140/73   Pulse 60   Temp 97.6 F (36.4 C) (Oral)   Resp 18   SpO2 99%   Physical Exam  Constitutional: She is oriented to person, place, and time. She appears well-developed and well-nourished. No distress.  HENT:  Head: Normocephalic.  Eyes: Pupils are equal, round, and reactive to light. Conjunctivae are normal. No scleral icterus.  Neck: Normal range of motion. Neck supple. No thyromegaly present.  Cardiovascular: Normal rate and regular rhythm.  Exam reveals no gallop and no friction rub.   No murmur heard. Pulmonary/Chest: Effort normal and breath sounds normal. No respiratory distress. She has no wheezes. She has no rales.  Abdominal: Soft. Bowel sounds are normal. She exhibits no distension. There is no tenderness. There is no rebound.  Musculoskeletal: Normal range of motion.  Neurological: She is alert and oriented to person, place, and time.  Skin: Skin is warm and dry. No  rash noted.  Psychiatric: She has a normal mood and affect. Her behavior is normal.     ED Treatments / Results  Labs (all labs ordered are listed, but only abnormal results are displayed) Labs Reviewed  COMPREHENSIVE METABOLIC PANEL - Abnormal; Notable for the following:       Result Value   GFR calc non Af Amer 58 (*)    All other components within normal limits  URINALYSIS, ROUTINE W REFLEX MICROSCOPIC - Abnormal; Notable for the following:    Nitrite POSITIVE (*)    Leukocytes, UA TRACE (*)    Bacteria, UA MANY (*)    Squamous Epithelial / LPF 0-5 (*)    All other components within normal limits  URINE CULTURE  CBC WITH DIFFERENTIAL/PLATELET    EKG  EKG Interpretation None       Radiology US Renal  Result Date: 04/06/2017 CLINICAL DATA:  Recurrent UTIs EXAM: RENAL / URINARY TRACT ULTRASOUND COMPLETE COMPARISON:  09/14/2011 FINDINGS: Right Kidney: Length: 8.6 cm. Normal echogenicity and cortical thickness. Minor central pelviectasis. No significant hydronephrosis. No focal renal abnormality. Left Kidney: Length: 9.6 cm. Echogenicity within normal limits. No mass or hydronephrosis visualized. Bladder: Appears normal for degree of bladder distention. IMPRESSION: Mild right central pelviectasis. No other acute finding or significant abnormality by renal ultrasound. Electronically Signed   By: Jerilynn Mages.  Shick M.D.   On: 04/06/2017 09:15    Procedures Procedures (including critical care time)  Medications Ordered in ED Medications  cefTRIAXone (ROCEPHIN) 1 g in dextrose 5 % 50 mL IVPB (1 g Intravenous New Bag/Given 04/06/17 0946)     Initial Impression / Assessment and Plan / ED Course  I have reviewed the triage vital signs and the nursing notes.  Pertinent labs & imaging results that were available during my care of the patient were reviewed by me and considered in my medical decision making (see chart for details).   last urine culture obtained March of this year was  pansensitive Escherichia coli. No culture with her 3 treatment regimens of Cipro since that time. Plan UA, urine culture, electrolytes. We'll give IV Rocephin. Reevaluated after the above.  Final Clinical Impressions(s) / ED Diagnoses   Final diagnoses:  Acute cystitis without hematuria    Patient given IV Rocephin. Will start on Keflex. My suspicion is that this may be a bacteria resistant to Cipro. Has mild pelvic caliectasis on one side without obstruction mass or cyst area and no stones. Plan follow-up with physician before completion of antibiotics for test of cure. Will follow-up urine sample.  New Prescriptions New Prescriptions   CEPHALEXIN (KEFLEX) 500 MG CAPSULE    Take 1 capsule (500 mg total) by mouth 3 (three) times daily.     Tanna Furry, MD 04/06/17 1051    Tanna Furry, MD 05/02/17 [phone]

## 2017-04-06 NOTE — ED Triage Notes (Signed)
Pt complains of being nauseated for two months, had an abscessed tooth,  Pt also had several UTI's and saw urologist and received more antibiotics, they were completed yesterday and last night she had the burning again and she performed a dip test at home and it was positive

## 2017-04-06 NOTE — Discharge Instructions (Signed)
Continue Keflex as directed until completed.  If your culture shows bacteria that is resistant to Keflex, we will contact you.  Follow-up with her primary care physician or urologist before completion of antibiotic for "test of cure" on your urine sample

## 2017-04-06 NOTE — ED Notes (Signed)
Ultrasound in room

## 2017-04-08 LAB — URINE CULTURE: Culture: >=100000 — AB

## 2017-04-09 ENCOUNTER — Telehealth: Payer: Self-pay

## 2017-04-09 NOTE — Telephone Encounter (Signed)
Post ED Visit - Positive Culture Follow-up  Culture report reviewed by antimicrobial stewardship pharmacist:  []  Elenor Quinones, Pharm.D. []  Heide Guile, Pharm.D., BCPS AQ-ID []  Parks Neptune, Pharm.D., BCPS []  Alycia Rossetti, Pharm.D., BCPS []  [place], Pharm.D., BCPS, AAHIVP []  Legrand Como, Pharm.D., BCPS, AAHIVP []  Salome Arnt, PharmD, BCPS []  Dimitri Ped, PharmD, BCPS []  Vincenza Hews, PharmD, BCPS Jimmy Footman Pharm D Positive urine culture Treated with Cephalexin, organism sensitive to the same and no further patient follow-up is required at this time.  Genia Del 04/09/2017, 9:52 AM

## 2017-04-13 ENCOUNTER — Telehealth: Payer: Self-pay | Admitting: *Deleted

## 2017-04-13 NOTE — Telephone Encounter (Signed)
Spoke with patient she states at her age she feels it is not necessary to have a medicare wellness visit as she feels she would decline any recommendations. . She states she is seeing specialists at this time and doesnt need to add anything additional. Patient declines Medicare wellness visit.

## 2017-05-04 ENCOUNTER — Ambulatory Visit (INDEPENDENT_AMBULATORY_CARE_PROVIDER_SITE_OTHER): Payer: Medicare Other | Admitting: Family Medicine

## 2017-05-04 ENCOUNTER — Encounter: Payer: Self-pay | Admitting: Family Medicine

## 2017-05-04 VITALS — BP 139/80 | HR 62 | Temp 98.4°F | Resp 20 | Wt 141.8 lb

## 2017-05-04 DIAGNOSIS — F419 Anxiety disorder, unspecified: Secondary | ICD-10-CM | POA: Insufficient documentation

## 2017-05-04 MED ORDER — PAROXETINE HCL 20 MG PO TABS: 20.0000 mg | ORAL_TABLET | ORAL | 0 refills | Status: DC

## 2017-05-04 NOTE — Patient Instructions (Signed)
If you get a UTI in [place]--> make sure you get seen and have urine culture completed. The only Oral antibiotic that your infections are susceptible to is Keflex.    Increase in paxil to 20 mg a day, if you feel it is too uch, then take half tab (10 mg) again.  Follow up before you leave for [place]

## 2017-05-04 NOTE — Progress Notes (Signed)
Molly Fleming , [birth year], 81 y.o., female MRN: [id number] Patient Care Team    Relationship Specialty Notifications Start End  [name], DO PCP - General Family Medicine  02/25/16   [name], MD Consulting Physician Orthopedic Surgery  02/25/16     Chief Complaint  Patient presents with  . Anxiety     Subjective: Molly Fleming is a 81 y.o. present for follow-up on anxiety. She was complaining of shakiness, nausea and increased anxiety on her last office visit. After completing some lab work to rule out other causes, she was agreeable to starting Paxil 10 mg daily. She had been on that medication prior in her life. She states she is tolerating the Paxil very well, she starting to feel back to her normal self. She doesn't feel that they her anxiety is perfectly controlled on the 10 mg dose and is wondering if increasing it would be beneficial.   Depression screen [facility] 2/9 02/25/2016 02/17/2015 01/03/2015  Decreased Interest 0 0 0  Down, Depressed, Hopeless 0 0 0  PHQ - 2 Score 0 0 0    Allergies  Allergen Reactions  . Sulfa Antibiotics Rash   Social History  Substance Use Topics  . Smoking status: Never Smoker  . Smokeless tobacco: Never Used  . Alcohol use No   Past Medical History:  Diagnosis Date  . Actinic keratosis 08/04/2011  . Allergy    seasonal  . Arthritis    right hand supplement  . Back pain   . Cervical cancer screening 07/10/2012  . Cystic disease of breast   . Depression   . Frequent UTI 08/04/2011  . Headache(784.0)   . Hyperlipidemia   . Left ankle sprain 05/22/2012  . Measles   . Mumps as a child  . Osteoporosis    started taking meds at age 61 then just went off recently- due to conflict she has heard  . Preventative health care 08/04/2011  . Skin lesion of left leg 07/10/2012  . Thyroid disease    s/p thyroidectomy, goiter  . Whooping cough as a child   Past Surgical History:  Procedure Laterality Date  . ABDOMINAL  HYSTERECTOMY  1982   partial still has ovaries, uterine prolapse and menorraghia  . APPENDECTOMY    . BREAST SURGERY     breast biopsies, b/l benign  . removed part of thyroid  2002  . TEE WITHOUT CARDIOVERSION  09/16/2011   Procedure: TRANSESOPHAGEAL ECHOCARDIOGRAM (TEE);  Surgeon: Peter Martinique, MD;  Location: [facility] ENDOSCOPY;  Service: Cardiovascular;  Laterality: N/A;  . TOTAL HIP ARTHROPLASTY Left    Family History  Problem Relation Age of Onset  . Adopted: Yes  . Cancer Mother   . Heart attack Father   . Heart disease Father   . Cancer Brother        colon  . Colon cancer Brother   . Anxiety disorder Son    Allergies as of 05/04/2017      Reactions   Sulfa Antibiotics Rash      Medication List       Accurate as of 05/04/17 12:12 PM. Always use your most recent med list.          aspirin 325 MG tablet Take 325 mg by mouth daily with breakfast.   atorvastatin 10 MG tablet Commonly known as:  LIPITOR TAKE ONE TABLET BY MOUTH ONCE DAILY   CALCIUM 1200 PO Take 1 capsule by mouth daily with breakfast.  levothyroxine 75 MCG tablet Commonly known as:  SYNTHROID, LEVOTHROID Take 1 tablet (75 mcg total) by mouth daily.   loperamide 2 MG tablet Commonly known as:  IMODIUM A-D Take 1 mg by mouth 2 (two) times daily.   PARoxetine 20 MG tablet Commonly known as:  PAXIL Take 1 tablet (20 mg total) by mouth every morning.   PROBIOTIC ADVANCED PO Take 1 capsule by mouth daily with breakfast.       All past medical history, surgical history, allergies, family history, immunizations andmedications were updated in the EMR today and reviewed under the history and medication portions of their EMR.     ROS: Negative, with the exception of above mentioned in HPI   Objective:  BP 139/80 (BP Location: Right Arm, Patient Position: Sitting, Cuff Size: Normal)   Pulse 62   Temp 98.4 F (36.9 C)   Resp 20   Wt 141 lb 12 oz (64.3 kg)   SpO2 98%   BMI 25.93 kg/m  Body mass  index is 25.93 kg/m. Gen: Afebrile. No acute distress.  HENT: AT. [place].  MMM.  Eyes:Pupils Equal Round Reactive to light, Extraocular movements intact,  Conjunctiva without redness, discharge or icterus. CV: RRR Chest: CTAB, no wheeze or crackles Neuro:  Normal gait. PERLA. EOMi. Alert. Oriented x3 Psych: Normal affect, dress and demeanor. Normal speech. Normal thought content and judgment.   No exam data present No results found. No results found for this or any previous visit (from the past 24 hour(s)).  Assessment/Plan: Molly Fleming is a 81 y.o. female present for OV for  Anxiety - Patient is responding well to Paxil 10 mg daily. She does feel like it maybe is not quite enough coverage. Discussed that she may not be seeing the full benefit quite yet since 11 4 weeks, sometimes takes 6-8 weeks to see full benefit of dosage. Discussed this with her today. Decided to refill prescription for Paxil 20 mg daily, which she will taper up to if she feels needed. If she tapers to 20 mg and it is too much she will taper back down to 10 mg. She will follow up before she leaves for [place] in October, and refillable be provided for dosage she decided. - Follow-up in October   Reviewed expectations re: course of current medical issues.  Discussed self-management of symptoms.  Outlined signs and symptoms indicating need for more acute intervention.  Patient verbalized understanding and all questions were answered.  Patient received an After-Visit Summary.     Note is dictated utilizing voice recognition software. Although note has been proof read prior to signing, occasional typographical errors still can be missed. If any questions arise, please do not hesitate to call for verification.   electronically signed by:  Howard Pouch, DO  [place]

## 2017-05-10 DIAGNOSIS — N3021 Other chronic cystitis with hematuria: Secondary | ICD-10-CM | POA: Diagnosis not present

## 2017-05-12 DIAGNOSIS — R300 Dysuria: Secondary | ICD-10-CM | POA: Diagnosis not present

## 2017-05-12 DIAGNOSIS — N3021 Other chronic cystitis with hematuria: Secondary | ICD-10-CM | POA: Diagnosis not present

## 2017-06-20 DIAGNOSIS — Z23 Encounter for immunization: Secondary | ICD-10-CM | POA: Diagnosis not present

## 2017-06-20 DIAGNOSIS — D225 Melanocytic nevi of trunk: Secondary | ICD-10-CM | POA: Diagnosis not present

## 2017-06-20 DIAGNOSIS — L821 Other seborrheic keratosis: Secondary | ICD-10-CM | POA: Diagnosis not present

## 2017-06-23 DIAGNOSIS — N3021 Other chronic cystitis with hematuria: Secondary | ICD-10-CM | POA: Diagnosis not present

## 2017-06-23 DIAGNOSIS — D4101 Neoplasm of uncertain behavior of right kidney: Secondary | ICD-10-CM | POA: Diagnosis not present

## 2017-06-29 DIAGNOSIS — E039 Hypothyroidism, unspecified: Secondary | ICD-10-CM | POA: Diagnosis not present

## 2017-06-29 DIAGNOSIS — Z471 Aftercare following joint replacement surgery: Secondary | ICD-10-CM | POA: Diagnosis not present

## 2017-06-29 DIAGNOSIS — E7849 Other hyperlipidemia: Secondary | ICD-10-CM | POA: Diagnosis not present

## 2017-06-29 DIAGNOSIS — Z8673 Personal history of transient ischemic attack (TIA), and cerebral infarction without residual deficits: Secondary | ICD-10-CM | POA: Diagnosis not present

## 2017-06-29 DIAGNOSIS — Z96642 Presence of left artificial hip joint: Secondary | ICD-10-CM | POA: Diagnosis not present

## 2017-06-29 DIAGNOSIS — Z882 Allergy status to sulfonamides status: Secondary | ICD-10-CM | POA: Diagnosis not present

## 2017-07-19 ENCOUNTER — Ambulatory Visit (INDEPENDENT_AMBULATORY_CARE_PROVIDER_SITE_OTHER): Payer: Medicare Other | Admitting: Family Medicine

## 2017-07-19 ENCOUNTER — Encounter: Payer: Self-pay | Admitting: Family Medicine

## 2017-07-19 VITALS — BP 126/78 | HR 67 | Resp 16 | Wt 150.0 lb

## 2017-07-19 DIAGNOSIS — F419 Anxiety disorder, unspecified: Secondary | ICD-10-CM

## 2017-07-19 DIAGNOSIS — E039 Hypothyroidism, unspecified: Secondary | ICD-10-CM | POA: Diagnosis not present

## 2017-07-19 MED ORDER — PAROXETINE HCL 20 MG PO TABS: 20.0000 mg | ORAL_TABLET | ORAL | 1 refills | Status: DC

## 2017-07-19 MED ORDER — LEVOTHYROXINE SODIUM 75 MCG PO TABS: 75.0000 ug | ORAL_TABLET | Freq: Every day | ORAL | 1 refills | Status: DC

## 2017-07-19 NOTE — Progress Notes (Signed)
Molly Fleming , [birth year], 81 y.o., female MRN: [id number] Patient Care Team    Relationship Specialty Notifications Start End  [name], DO PCP - General Family Medicine  02/25/16   [name], MD Consulting Physician Orthopedic Surgery  02/25/16     Chief Complaint  Patient presents with  . Follow-up    Medication refill     Subjective: Molly Fleming is a 81 y.o. present for follow-up on anxiety. Patient presents today for follow-up on her anxiety. She states she still feels shaky at times but feels the increased dose of Paxil to 20 mg as been very helpful. She is feeling very good. [place].   Prior note 05/04/2017: She was complaining of shakiness, nausea and increased anxiety on her last office visit. After completing some lab work to rule out other causes, she was agreeable to starting Paxil 10 mg daily. She had been on that medication prior in her life. She states she is tolerating the Paxil very well, she starting to feel back to her normal self. She doesn't feel that they her anxiety is perfectly controlled on the 10 mg dose and is wondering if increasing it would be beneficial.   Depression screen [facility] 2/9 02/25/2016 02/17/2015 01/03/2015  Decreased Interest 0 0 0  Down, Depressed, Hopeless 0 0 0  PHQ - 2 Score 0 0 0    Allergies  Allergen Reactions  . Sulfa Antibiotics Rash   Social History  Substance Use Topics  . Smoking status: Never Smoker  . Smokeless tobacco: Never Used  . Alcohol use No   Past Medical History:  Diagnosis Date  . Actinic keratosis 08/04/2011  . Allergy    seasonal  . Arthritis    right hand supplement  . Back pain   . Cervical cancer screening 07/10/2012  . Cystic disease of breast   . Depression   . Frequent UTI 08/04/2011  . Headache(784.0)   . Hyperlipidemia   . Left ankle sprain 05/22/2012  . Measles   . Mumps as a child  . Osteoporosis    started taking meds at age 6 then just went off recently- due to conflict she has heard  . Preventative health care 08/04/2011  . Skin lesion of left leg 07/10/2012  . Thyroid disease    s/p thyroidectomy, goiter  . Whooping cough as a child   Past Surgical History:  Procedure Laterality Date  . ABDOMINAL HYSTERECTOMY  1982   partial still has ovaries, uterine prolapse and menorraghia  . APPENDECTOMY    . BREAST SURGERY     breast biopsies, b/l benign  . removed part of thyroid  2002  . TEE WITHOUT CARDIOVERSION  09/16/2011   Procedure: TRANSESOPHAGEAL ECHOCARDIOGRAM (TEE);  Surgeon: Peter Martinique, MD;  Location[facility] ENDOSCOPY;  Service: Cardiovascular;  Laterality: N/A;  . TOTAL HIP ARTHROPLASTY Left    Family History  Problem Relation Age of Onset  . Adopted: Yes  . Cancer Mother   . Heart attack Father   . Heart disease Father   . Cancer Brother        colon  . Colon cancer Brother   . Anxiety disorder Son    Allergies as of 07/19/2017      Reactions   Sulfa Antibiotics Rash      Medication List       Accurate as of 07/19/17  2:21 PM. Always use  your most recent med list.          aspirin 325 MG tablet Take 325 mg by mouth daily with breakfast.   atorvastatin 10 MG tablet Commonly known as:  LIPITOR TAKE ONE TABLET BY MOUTH ONCE DAILY   CALCIUM 1200 PO Take 1 capsule by mouth daily with breakfast.   levothyroxine 75 MCG tablet Commonly known as:  SYNTHROID, LEVOTHROID Take 1 tablet (75 mcg total) by mouth daily.   loperamide 2 MG tablet Commonly known as:  IMODIUM A-D Take 1 mg by mouth 2 (two) times daily.   PARoxetine 20 MG tablet Commonly known as:  PAXIL Take 1 tablet (20 mg total) by mouth every morning.   PREMARIN vaginal cream Generic drug:  conjugated estrogens APPLY 0.5 GRAMS TOPICALLY EVERY OTHER DAY USE 2-3 X WEEKLY AT BEDTIME.   PROBIOTIC ADVANCED PO Take 1 capsule by mouth daily with breakfast.       All past medical history,  surgical history, allergies, family history, immunizations andmedications were updated in the EMR today and reviewed under the history and medication portions of their EMR.     ROS: Negative, with the exception of above mentioned in HPI  Objective:  BP (!) 146/80 (BP Location: Right Arm, Patient Position: Sitting, Cuff Size: Normal)   Pulse 67   Resp 16   Wt 150 lb (68 kg)   SpO2 96%   BMI 27.44 kg/m  Body mass index is 27.44 kg/m.  Gen: Afebrile. No acute distress.  HENT: AT. [place]. MMM.  Eyes:Pupils Equal Round Reactive to light, Extraocular movements intact,  Conjunctiva without redness, discharge or icterus. Neck/lymp/endocrine: Supple, no thyromegaly CV: RRR, noedema, +2/4 P posterior tibialis pulses Chest: CTAB, no wheeze or crackles Neuro:  Normal gait. PERLA. EOMi. Alert. Oriented x3 Psych: Normal affect, dress and demeanor. Normal speech. Normal thought content and judgment.   No exam data present No results found. No results found for this or any previous visit (from the past 24 hour(s)).  Assessment/Plan: Molly Fleming is a 81 y.o. female present for OV for  Anxiety - stable. Patient doing very well on increased his Paxil 20 mg daily. Refills provided her today in follow-up in 6 months when she returns from [place]. Hypothyroidism: Refills on levothyroxine 75 g daily. TSH recently collected within the past year reviewed and normal on this dose. Will follow yearly physicals.  Reviewed expectations re: course of current medical issues.  Discussed self-management of symptoms.  Outlined signs and symptoms indicating need for more acute intervention.  Patient verbalized understanding and all questions were answered.  Patient received an After-Visit Summary.     Note is dictated utilizing voice recognition software. Although note has been proof read prior to signing, occasional typographical errors still can be missed. If any questions arise, please do not  hesitate to call for verification.   electronically signed by:  Howard Pouch, DO  [place]

## 2017-07-19 NOTE — Patient Instructions (Signed)
BP much better on repeat... But... Still exercise and watch your diet in [place].    Have a great winter. Medications refilled. Follow up in 6 months.    Please help [place] help you:  We are honored you have chosen [facility] for your Primary Care home. Below you will find basic instructions that you may need to access in the future. Please help [place] help you by reading the instructions, which cover many of the frequent questions we experience.   Prescription refills and request:  -In order to allow more efficient response time, please call your pharmacy for all refills. They will forward the request electronically to [place]. This allows for the quickest possible response. Request left on a nurse line can take longer to refill, since these are checked as time allows between office patients and other phone calls.  - refill request can take up to 3-5 working days to complete.  - If request is sent electronically and request is appropiate, it is usually completed in 1-2 business days.  - all patients will need to be seen routinely for all chronic medical conditions requiring prescription medications (see follow-up below). If you are overdue for follow up on your condition, you will be asked to make an appointment and we will call in enough medication to cover you until your appointment (up to 30 days).  - all controlled substances will require a face to face visit to request/refill.  - if you desire your prescriptions to go through a new pharmacy, and have an active script at original pharmacy, you will need to call your pharmacy and have scripts transferred to new pharmacy. This is completed between the pharmacy locations and not by your provider.    Results: If any images or labs were ordered, it can take up to 1 week to get results depending on the test ordered and the lab/facility running and resulting the test. - Normal or stable results, which do not need further discussion, may be released to  your mychart immediately with attached note to you. A call may not be generated for normal results. Please make certain to sign up for mychart. If you have questions on how to activate your mychart you can call the front office.  - If your results need further discussion, our office will attempt to contact you via phone, and if unable to reach you after 2 attempts, we will release your abnormal result to your mychart with instructions.  - All results will be automatically released in mychart after 1 week.  - Your provider will provide you with explanation and instruction on all relevant material in your results. Please keep in mind, results and labs may appear confusing or abnormal to the untrained eye, but it does not mean they are actually abnormal for you personally. If you have any questions about your results that are not covered, or you desire more detailed explanation than what was provided, you should make an appointment with your provider to do so.   Our office handles many outgoing and incoming calls daily. If we have not contacted you within 1 week about your results, please check your mychart to see if there is a message first and if not, then contact our office.  In helping with this matter, you help decrease call volume, and therefore allow [place] to be able to respond to patients needs more efficiently.   Acute office visits (sick visit):  An acute visit is intended for a new problem and  are scheduled in shorter time slots to allow schedule openings for patients with new problems. This is the appropriate visit to discuss a new problem. In order to provide you with excellent quality medical care with proper time for you to explain your problem, have an exam and receive treatment with instructions, these appointments should be limited to one new problem per visit. If you experience a new problem, in which you desire to be addressed, please make an acute office visit, we save openings on the schedule  to accommodate you. Please do not save your new problem for any other type of visit, let us take care of it properly and quickly for you.   Follow up visits:  Depending on your condition(s) your provider will need to see you routinely in order to provide you with quality care and prescribe medication(s). Most chronic conditions (Example: hypertension, Diabetes, depression/anxiety... etc), require visits a couple times a year. Your provider will instruct you on proper follow up for your personal medical conditions and history. Please make certain to make follow up appointments for your condition as instructed. Failing to do so could result in lapse in your medication treatment/refills. If you request a refill, and are overdue to be seen on a condition, we will always provide you with a 30 day script (once) to allow you time to schedule.    Medicare wellness (well visit): - we have a wonderful Nurse Maudie Mercury), that will meet with you and provide you will yearly medicare wellness visits. These visits should occur yearly (can not be scheduled less than 1 calendar year apart) and cover preventive health, immunizations, advance directives and screenings you are entitled to yearly through your medicare benefits. Do not miss out on your entitled benefits, this is when medicare will pay for these benefits to be ordered for you.  These are strongly encouraged by your provider and is the appropriate type of visit to make certain you are up to date with all preventive health benefits. If you have not had your medicare wellness exam in the last 12 months, please make certain to schedule one by calling the office and schedule your medicare wellness with Maudie Mercury as soon as possible.   Yearly physical (well visit):  - Adults are recommended to be seen yearly for physicals. Check with your insurance and date of your last physical, most insurances require one calendar year between physicals. Physicals include all preventive health  topics, screenings, medical exam and labs that are appropriate for gender/age and history. You may have fasting labs needed at this visit. This is a well visit (not a sick visit), new problems should not be covered during this visit (see acute visit).  - Pediatric patients are seen more frequently when they are younger. Your provider will advise you on well child visit timing that is appropriate for your their age. - This is not a medicare wellness visit. Medicare wellness exams do not have an exam portion to the visit. Some medicare companies allow for a physical, some do not allow a yearly physical. If your medicare allows a yearly physical you can schedule the medicare wellness with our nurse Maudie Mercury and have your physical with your provider after, on the same day. Please check with insurance for your full benefits.   Late Policy/No Shows:  - all new patients should arrive 15-30 minutes earlier than appointment to allow [place] time  to  obtain all personal demographics,  insurance information and for you to complete office paperwork. -  All established patients should arrive 10-15 minutes earlier than appointment time to update all information and be checked in .  - In our best efforts to run on time, if you are late for your appointment you will be asked to either reschedule or if able, we will work you back into the schedule. There will be a wait time to work you back in the schedule,  depending on availability.  - If you are unable to make it to your appointment as scheduled, please call 24 hours ahead of time to allow [place] to fill the time slot with someone else who needs to be seen. If you do not cancel your appointment ahead of time, you may be charged a no show fee.

## 2017-09-09 ENCOUNTER — Other Ambulatory Visit: Payer: Self-pay | Admitting: *Deleted

## 2017-09-09 DIAGNOSIS — E7849 Other hyperlipidemia: Secondary | ICD-10-CM

## 2017-09-09 MED ORDER — ATORVASTATIN CALCIUM 10 MG PO TABS: ORAL_TABLET | ORAL | 0 refills | Status: DC

## 2017-10-03 DIAGNOSIS — R1901 Right upper quadrant abdominal swelling, mass and lump: Secondary | ICD-10-CM | POA: Diagnosis not present

## 2017-10-03 DIAGNOSIS — R109 Unspecified abdominal pain: Secondary | ICD-10-CM | POA: Diagnosis not present

## 2017-10-03 DIAGNOSIS — R1011 Right upper quadrant pain: Secondary | ICD-10-CM | POA: Diagnosis not present

## 2017-10-11 ENCOUNTER — Other Ambulatory Visit: Payer: Self-pay | Admitting: *Deleted

## 2017-10-11 DIAGNOSIS — K430 Incisional hernia with obstruction, without gangrene: Secondary | ICD-10-CM | POA: Diagnosis not present

## 2017-10-11 DIAGNOSIS — E7849 Other hyperlipidemia: Secondary | ICD-10-CM

## 2017-10-11 MED ORDER — ATORVASTATIN CALCIUM 10 MG PO TABS
ORAL_TABLET | ORAL | 0 refills | Status: AC
Start: 2017-10-11 — End: ?

## 2017-10-14 DIAGNOSIS — Z6829 Body mass index (BMI) 29.0-29.9, adult: Secondary | ICD-10-CM | POA: Diagnosis not present

## 2017-10-14 DIAGNOSIS — K439 Ventral hernia without obstruction or gangrene: Secondary | ICD-10-CM | POA: Diagnosis not present

## 2017-10-14 DIAGNOSIS — Z01818 Encounter for other preprocedural examination: Secondary | ICD-10-CM | POA: Diagnosis not present

## 2017-10-14 DIAGNOSIS — I639 Cerebral infarction, unspecified: Secondary | ICD-10-CM | POA: Diagnosis not present

## 2017-10-14 DIAGNOSIS — K430 Incisional hernia with obstruction, without gangrene: Secondary | ICD-10-CM | POA: Diagnosis not present

## 2017-10-14 DIAGNOSIS — H269 Unspecified cataract: Secondary | ICD-10-CM | POA: Diagnosis not present

## 2017-10-18 DIAGNOSIS — K430 Incisional hernia with obstruction, without gangrene: Secondary | ICD-10-CM | POA: Diagnosis not present

## 2017-10-21 DIAGNOSIS — Z809 Family history of malignant neoplasm, unspecified: Secondary | ICD-10-CM | POA: Diagnosis not present

## 2017-10-21 DIAGNOSIS — Z90710 Acquired absence of both cervix and uterus: Secondary | ICD-10-CM | POA: Diagnosis not present

## 2017-10-21 DIAGNOSIS — Z79899 Other long term (current) drug therapy: Secondary | ICD-10-CM | POA: Diagnosis not present

## 2017-10-21 DIAGNOSIS — Z6829 Body mass index (BMI) 29.0-29.9, adult: Secondary | ICD-10-CM | POA: Diagnosis not present

## 2017-10-21 DIAGNOSIS — H269 Unspecified cataract: Secondary | ICD-10-CM | POA: Diagnosis not present

## 2017-10-21 DIAGNOSIS — Z8719 Personal history of other diseases of the digestive system: Secondary | ICD-10-CM | POA: Diagnosis not present

## 2017-10-21 DIAGNOSIS — K430 Incisional hernia with obstruction, without gangrene: Secondary | ICD-10-CM | POA: Diagnosis not present

## 2017-10-21 DIAGNOSIS — M810 Age-related osteoporosis without current pathological fracture: Secondary | ICD-10-CM | POA: Diagnosis not present

## 2017-10-21 DIAGNOSIS — Z7982 Long term (current) use of aspirin: Secondary | ICD-10-CM | POA: Diagnosis not present

## 2017-10-21 DIAGNOSIS — Z823 Family history of stroke: Secondary | ICD-10-CM | POA: Diagnosis not present

## 2017-10-21 DIAGNOSIS — K432 Incisional hernia without obstruction or gangrene: Secondary | ICD-10-CM | POA: Diagnosis not present

## 2017-10-21 DIAGNOSIS — Z8673 Personal history of transient ischemic attack (TIA), and cerebral infarction without residual deficits: Secondary | ICD-10-CM | POA: Diagnosis not present

## 2017-10-21 DIAGNOSIS — Z87440 Personal history of urinary (tract) infections: Secondary | ICD-10-CM | POA: Diagnosis not present

## 2017-10-21 DIAGNOSIS — Z96642 Presence of left artificial hip joint: Secondary | ICD-10-CM | POA: Diagnosis not present

## 2017-10-21 DIAGNOSIS — E890 Postprocedural hypothyroidism: Secondary | ICD-10-CM | POA: Diagnosis not present

## 2017-10-21 DIAGNOSIS — E663 Overweight: Secondary | ICD-10-CM | POA: Diagnosis not present

## 2017-10-21 DIAGNOSIS — Z882 Allergy status to sulfonamides status: Secondary | ICD-10-CM | POA: Diagnosis not present

## 2017-10-21 DIAGNOSIS — Z8249 Family history of ischemic heart disease and other diseases of the circulatory system: Secondary | ICD-10-CM | POA: Diagnosis not present

## 2017-11-07 DIAGNOSIS — K430 Incisional hernia with obstruction, without gangrene: Secondary | ICD-10-CM | POA: Diagnosis not present

## 2017-12-09 ENCOUNTER — Other Ambulatory Visit: Payer: Self-pay

## 2017-12-09 MED ORDER — LEVOTHYROXINE SODIUM 75 MCG PO TABS: 75.0000 ug | ORAL_TABLET | Freq: Every day | ORAL | 0 refills | Status: AC

## 2017-12-23 DIAGNOSIS — E039 Hypothyroidism, unspecified: Secondary | ICD-10-CM | POA: Diagnosis not present

## 2017-12-23 DIAGNOSIS — E785 Hyperlipidemia, unspecified: Secondary | ICD-10-CM | POA: Diagnosis not present

## 2017-12-23 DIAGNOSIS — F329 Major depressive disorder, single episode, unspecified: Secondary | ICD-10-CM | POA: Diagnosis not present

## 2017-12-23 DIAGNOSIS — Z6829 Body mass index (BMI) 29.0-29.9, adult: Secondary | ICD-10-CM | POA: Diagnosis not present

## 2018-01-02 ENCOUNTER — Other Ambulatory Visit: Payer: Self-pay | Admitting: *Deleted

## 2018-01-02 MED ORDER — PAROXETINE HCL 20 MG PO TABS: 20.0000 mg | ORAL_TABLET | ORAL | 0 refills | Status: AC

## 2018-02-08 DIAGNOSIS — N3021 Other chronic cystitis with hematuria: Secondary | ICD-10-CM | POA: Diagnosis not present

## 2018-02-08 DIAGNOSIS — N952 Postmenopausal atrophic vaginitis: Secondary | ICD-10-CM | POA: Diagnosis not present

## 2018-02-08 DIAGNOSIS — D4101 Neoplasm of uncertain behavior of right kidney: Secondary | ICD-10-CM | POA: Diagnosis not present

## 2018-04-09 IMAGING — CT CT L SPINE W/ CM
1 of 6 series · 5 of 14 positions shown, 7 images · non-contrast
Comparison: None

CLINICAL DATA: Scoliosis. Left hip and lateral left lower extremity
pain.
TECHNIQUE: Contiguous axial images were obtained through the Lumbar spine after
the intrathecal infusion of infusion. Coronal and sagittal
reconstructions were obtained of the axial image sets.

[Series 2: l spine soft · axial · 0.27mm/px · z∈[-232,-76]mm · 5 of 78 slices shown, 7 images]
[im 13/78  soft-tissue]
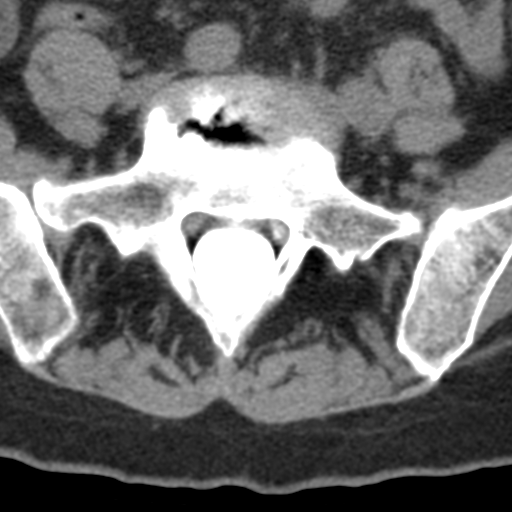
[im 13/78  bone]
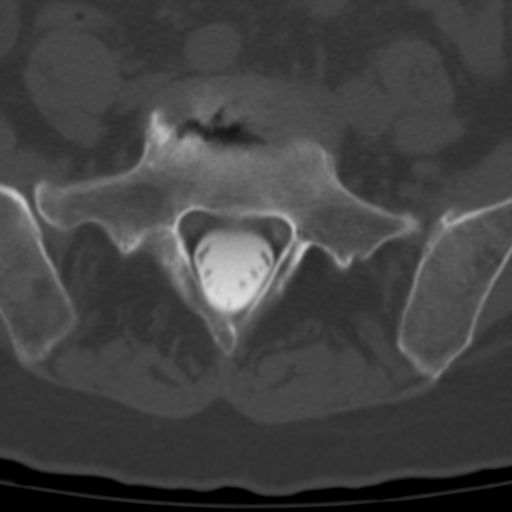
[im 26/78  bone]
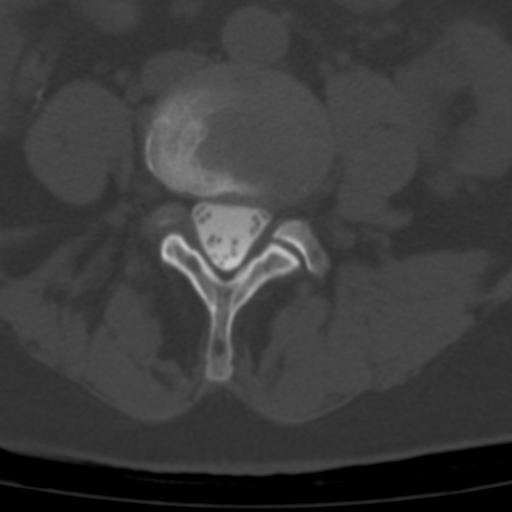
[im 39/78  bone]
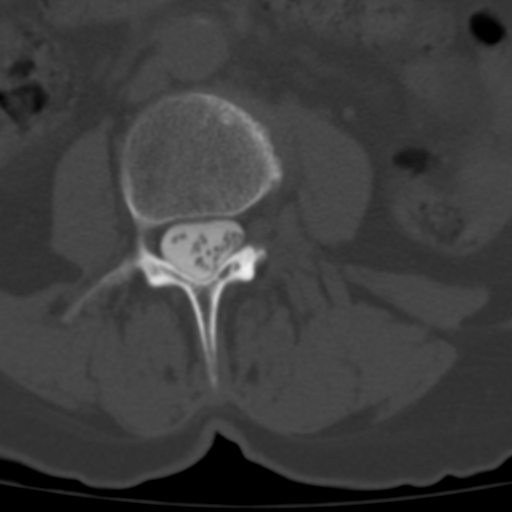
[im 52/78  bone]
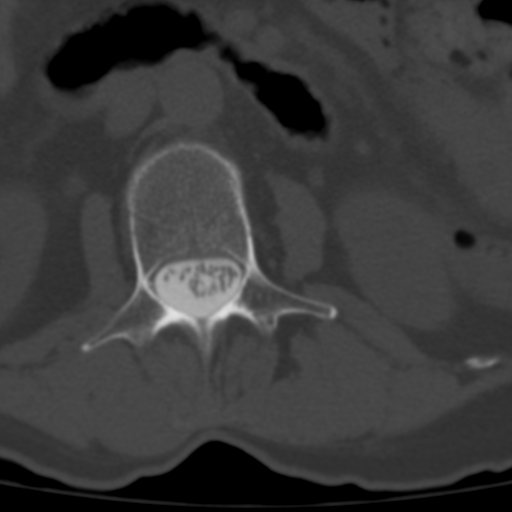
[im 65/78  soft-tissue]
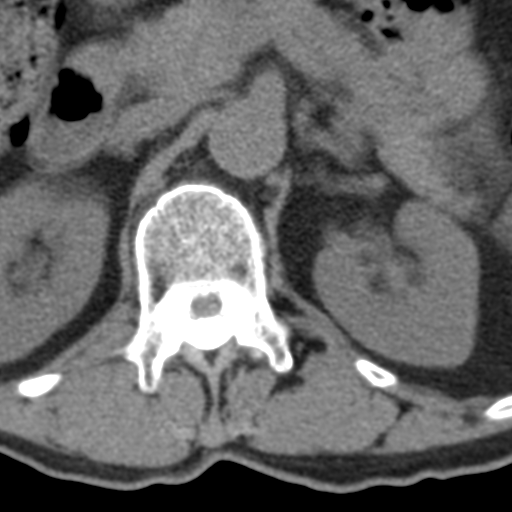
[im 65/78  bone]
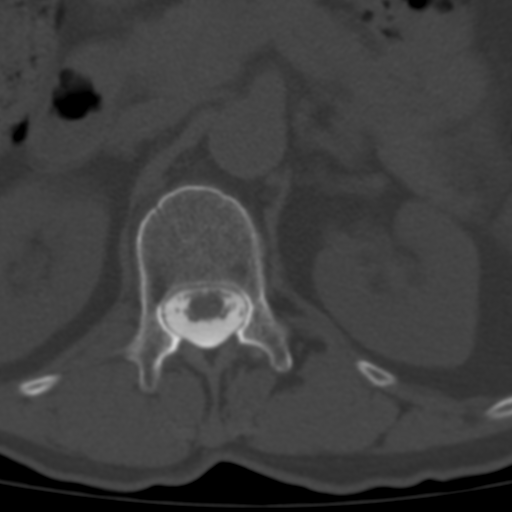

[5 of 14 positions shown; findings below may reference images not displayed]

EXAM:
LUMBAR MYELOGRAM

FLUOROSCOPY TIME:  Radiation Exposure Index (as provided by the
fluoroscopic device): 210.01 microGray*m^2

Fluoroscopy Time (in minutes and seconds):  1 minute 8 seconds

PROCEDURE:
After thorough discussion of risks and benefits of the procedure
including bleeding, infection, injury to nerves, blood vessels,
adjacent structures as well as headache and CSF leak, written and
oral informed consent was obtained. Consent was obtained by Dr.
Um Shahm Hamzh. Time out form was completed.

Patient was positioned prone on the fluoroscopy table. Local
anesthesia was provided with 1% lidocaine without epinephrine after
prepped and draped in the usual sterile fashion. Puncture was
performed at L3-4 using a 3 1/2 inch 22-gauge spinal needle via a
right interlaminar approach. Using a single pass through the dura,
the needle was placed within the thecal sac, with return of clear
CSF. 15 mL of Isovue M 200 was injected into the thecal sac, with
normal opacification of the nerve roots and cauda equina consistent
with free flow within the subarachnoid space.

I personally performed the lumbar puncture and administered the
intrathecal contrast. I also personally supervised acquisition of
the myelogram images.
FINDINGS: LUMBAR MYELOGRAM FINDINGS:

No significant listhesis is seen on prone or upright images
including during flexion and extension. Advanced disc space
narrowing is present at L5-S1. Ventral extradural defects compatible
with disc bulging are present throughout the lumbar spine, most
notable at L3-4. No significant spinal stenosis or definite nerve
root cut off is identified.

CT LUMBAR MYELOGRAM FINDINGS:

There is mild lumbar levoscoliosis scratched of there is mild lumbar
dextroscoliosis. There is at most trace retrolisthesis of L2 on L3
and L4 on L5 and at most trace anterolisthesis of L3 on L4.
Vertebral body heights are preserved. There is severe right-sided
disc space height loss at L5-S1 with associated vacuum disc
phenomenon and endplate sclerosis. Mild disc space narrowing is
present at L3-4. The conus medullaris terminates at L1-2. There is a
2 mm intradural nodule in the right lateral aspect of the thecal sac
at L1 (series 3, image 16). Mild atherosclerotic aortic
calcification is noted.

T11-12: Partially visualized, advanced right facet arthrosis.

T12-L1: Negative.

L1-2:  Minimal disc bulging without stenosis.

L2-3:  Mild anterior disc bulging without stenosis.

L3-4: Disc bulging eccentric to the left, mild ligamentum flavum
thickening, and moderate facet arthrosis result in mild left lateral
recess and mild left neural foraminal stenosis. No spinal stenosis.

L4-5: Minimal disc bulging and mild facet and ligamentum flavum
hypertrophy without significant stenosis.

L5-S1: Disc bulging, endplate spurring, disc space height loss, and
facet arthrosis all asymmetric on the right result in severe right
neural foraminal stenosis without spinal stenosis.
IMPRESSION: 1. Mild left lateral recess and mild left neural foraminal stenosis
at L3-4 due to disc bulging and facet arthrosis.
2. Advanced L5-S1 disc degeneration with severe right neural
foraminal stenosis.
3. 2 mm intradural nodule in the right aspect of the canal at L1,
possibly a tiny nerve sheath tumor. No other nodularity and no
provided history of cancer to suggest leptomeningeal metastatic
disease. Contrast-enhanced lumbar spine MRI could be considered for
further evaluation as clinically warranted.

## 2018-06-29 DIAGNOSIS — Z96642 Presence of left artificial hip joint: Secondary | ICD-10-CM | POA: Diagnosis not present

## 2018-06-29 DIAGNOSIS — Z471 Aftercare following joint replacement surgery: Secondary | ICD-10-CM | POA: Diagnosis not present

## 2018-08-02 DIAGNOSIS — Z23 Encounter for immunization: Secondary | ICD-10-CM | POA: Diagnosis not present

## 2018-08-29 ENCOUNTER — Ambulatory Visit: Payer: Medicare Other | Admitting: Family Medicine

## 2018-12-27 DIAGNOSIS — E039 Hypothyroidism, unspecified: Secondary | ICD-10-CM | POA: Diagnosis not present

## 2018-12-27 DIAGNOSIS — F329 Major depressive disorder, single episode, unspecified: Secondary | ICD-10-CM | POA: Diagnosis not present

## 2018-12-27 DIAGNOSIS — E785 Hyperlipidemia, unspecified: Secondary | ICD-10-CM | POA: Diagnosis not present

## 2019-04-25 ENCOUNTER — Other Ambulatory Visit: Payer: Self-pay

## 2019-06-19 IMAGING — US US RENAL
1 series · 14 of 25 positions shown · non-contrast
Comparison: 09/14/2011

CLINICAL DATA: Recurrent UTIs

EXAM:
RENAL / URINARY TRACT ULTRASOUND COMPLETE

[Series 1: us renal · 0.19mm/px · 14 of 46 slices shown]
[im 1/46]
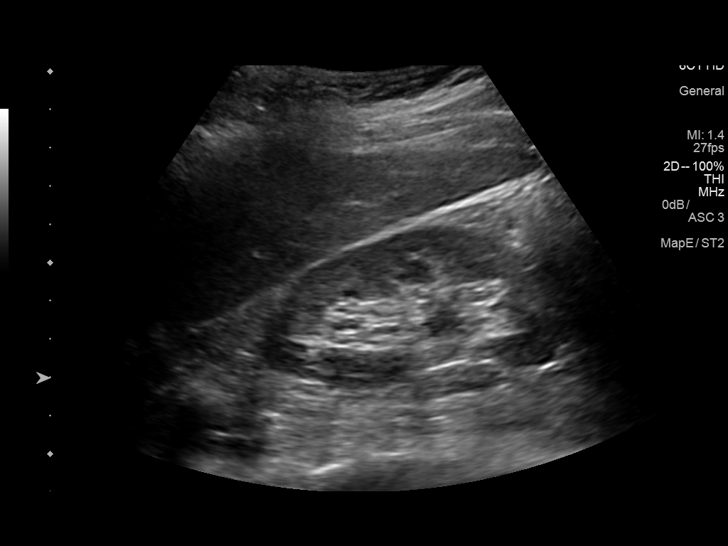
[im 4/46]
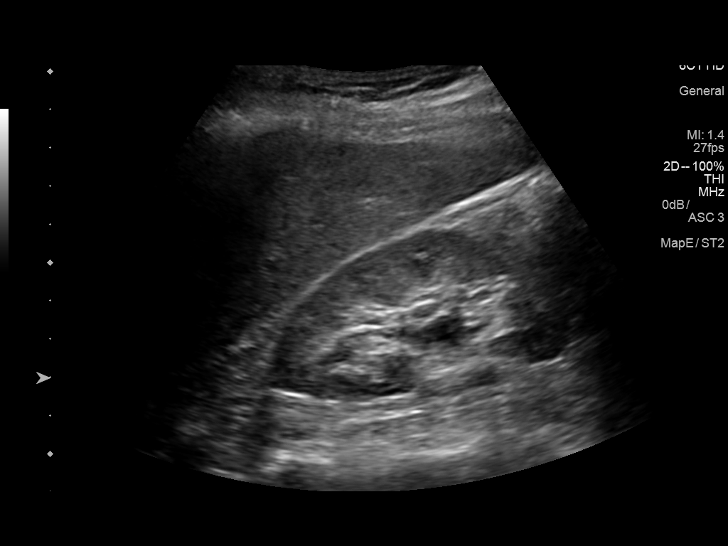
[im 8/46]
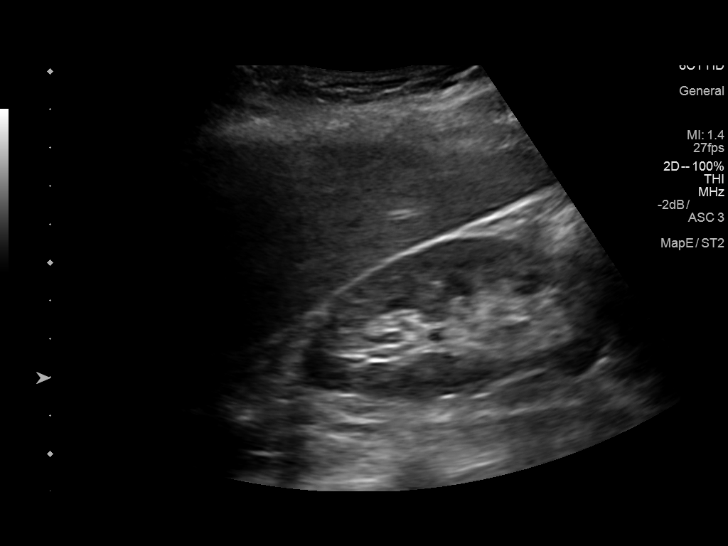
[im 12/46]
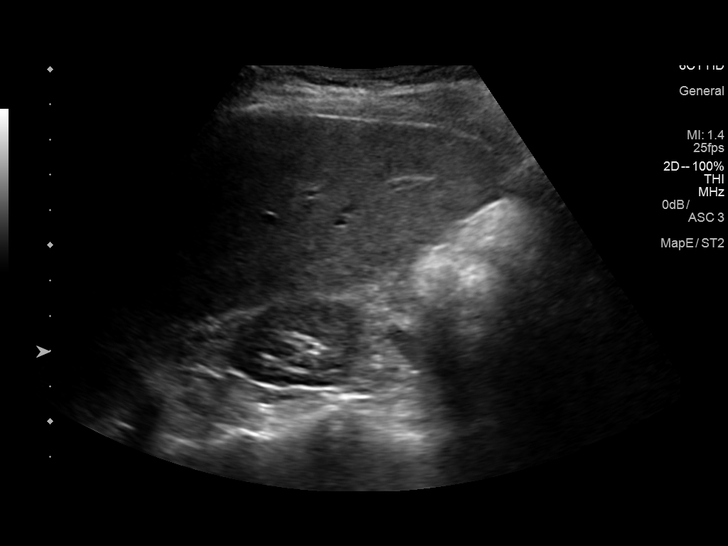
[im 16/46]
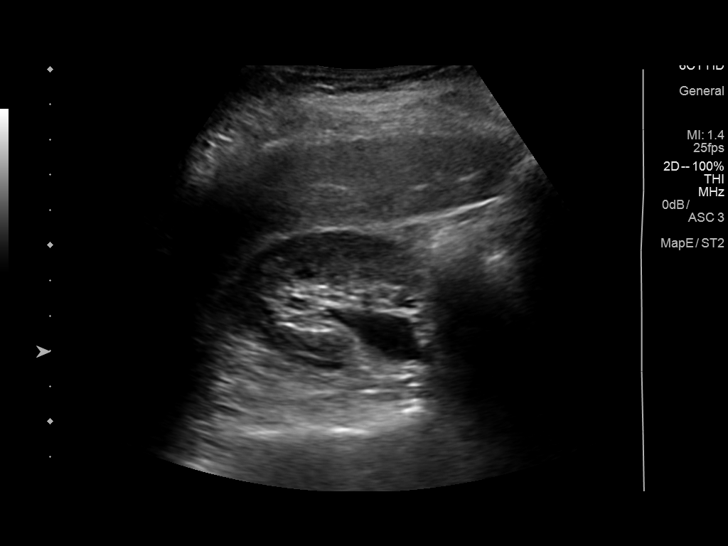
[im 17/46]
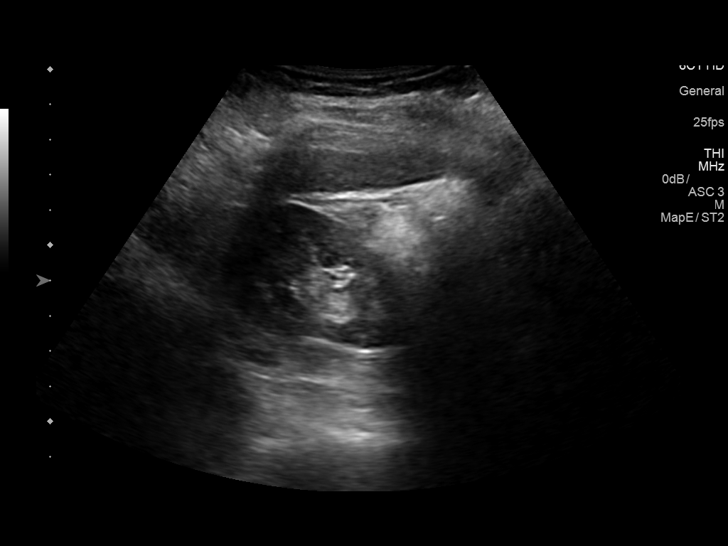
[im 21/46]
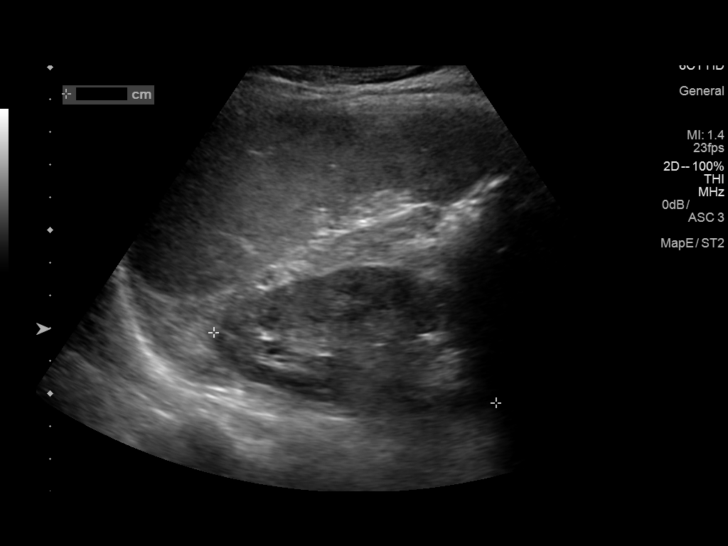
[im 25/46]
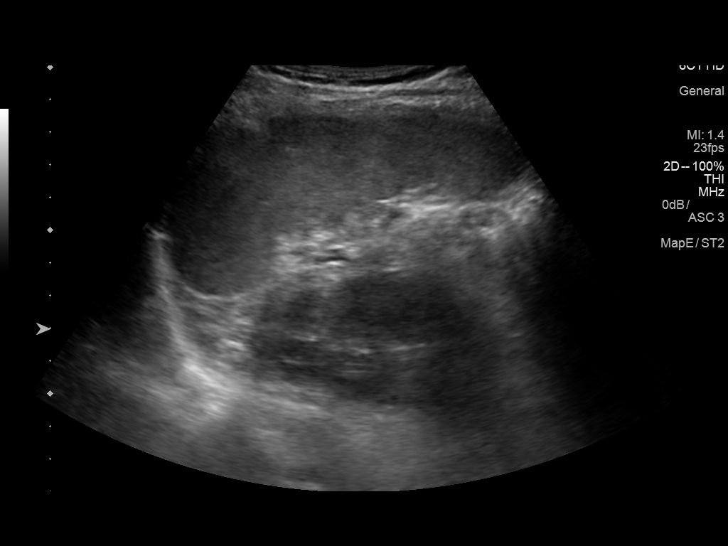
[im 29/46]
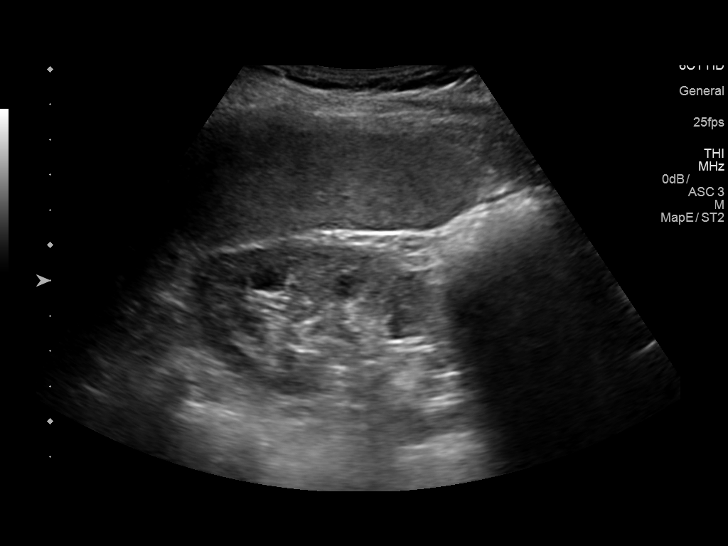
[im 31/46]
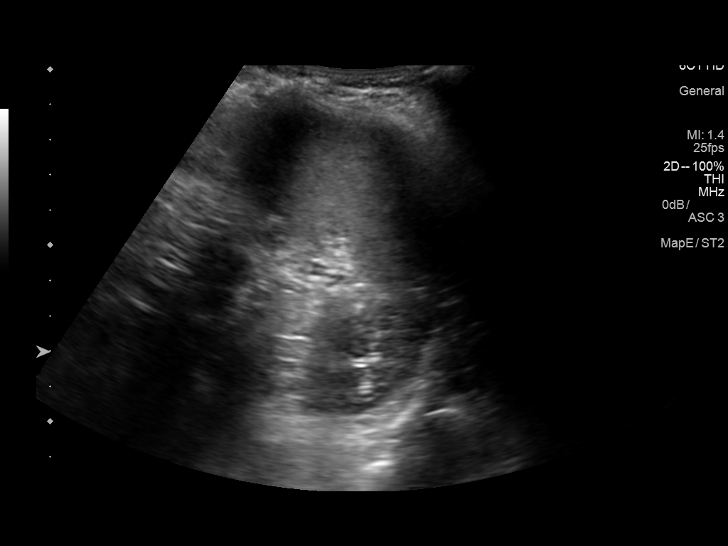
[im 34/46]
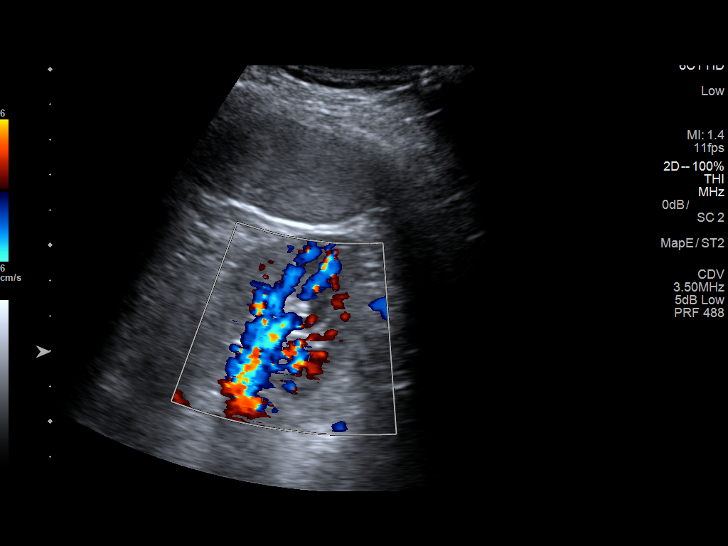
[im 38/46]
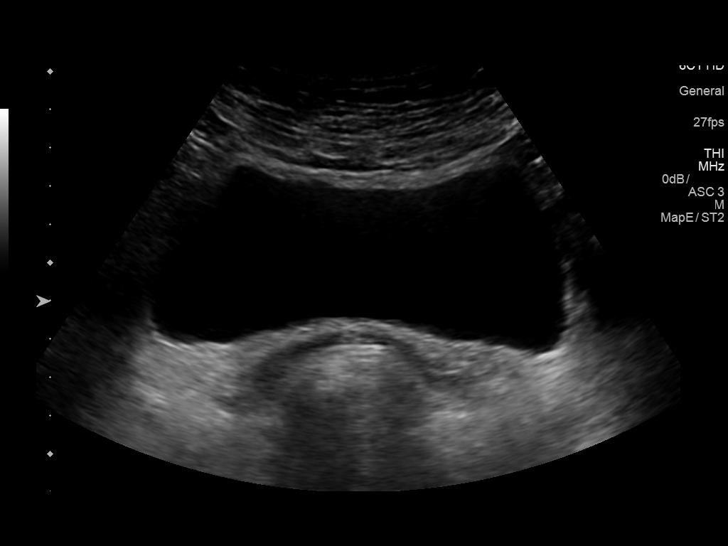
[im 42/46]
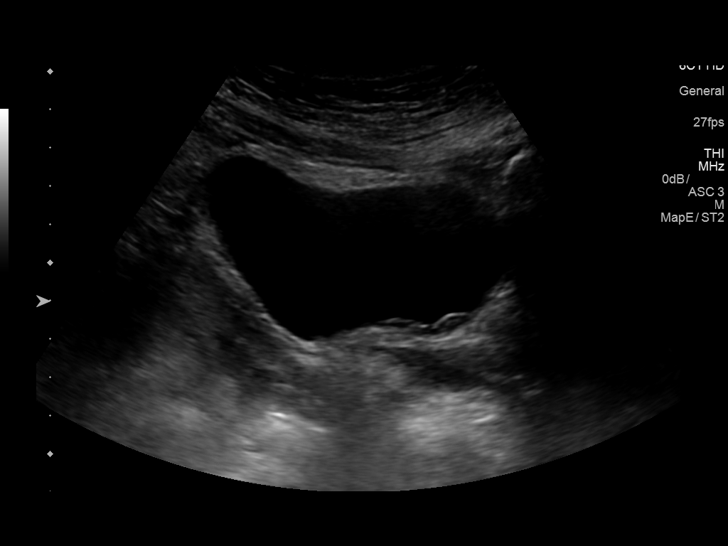
[im 46/46]
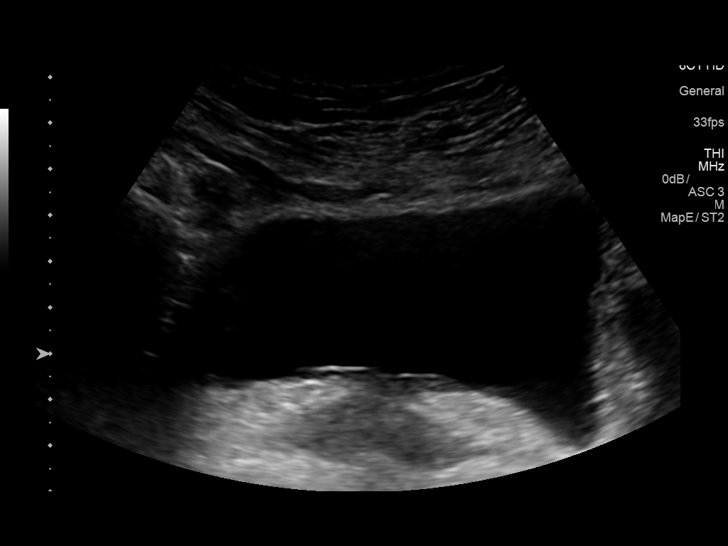

[14 of 25 positions shown; findings below may reference images not displayed]

FINDINGS: Right Kidney:

Length: 8.6 cm. Normal echogenicity and cortical thickness. Minor
central pelviectasis. No significant hydronephrosis. No focal renal
abnormality.

Left Kidney:

Length: 9.6 cm. Echogenicity within normal limits. No mass or
hydronephrosis visualized.

Bladder:

Appears normal for degree of bladder distention.
IMPRESSION: Mild right central pelviectasis. No other acute finding or
significant abnormality by renal ultrasound.

## 2019-06-21 DIAGNOSIS — Z23 Encounter for immunization: Secondary | ICD-10-CM | POA: Diagnosis not present

## 2019-09-19 LAB — BASIC METABOLIC PANEL
BUN: 26 — AB (ref 4–21)
Creatinine: 0.9 (ref 0.5–1.1)
Potassium: 4.2 (ref 3.4–5.3)
Sodium: 136 — AB (ref 137–147)

## 2019-09-19 LAB — FECAL OCCULT BLOOD, GUAIAC: Fecal Occult Blood: POSITIVE

## 2019-09-19 LAB — CBC AND DIFFERENTIAL
HCT: 41 (ref 36–46)
Hemoglobin: 13.3 (ref 12.0–16.0)
Platelets: 286 (ref 150–399)
WBC: 12.5

## 2019-09-19 LAB — HEPATIC FUNCTION PANEL
ALT: 10 (ref 7–35)
AST: 17 (ref 13–35)

## 2019-09-19 LAB — CBC: RBC: 4.61 (ref 3.87–5.11)

## 2019-09-20 ENCOUNTER — Encounter: Payer: Self-pay | Admitting: Family Medicine

## 2022-12-09 ENCOUNTER — Telehealth: Payer: Self-pay | Admitting: General Practice

## 2022-12-09 NOTE — Telephone Encounter (Signed)
Contacted Molly Fleming to schedule their annual wellness visit. Patient declined to schedule AWV at this time.  NO PCP LISTED.  [place] Direct Dial [phone]
